# Patient Record
Sex: Male | Born: 1960 | Race: Black or African American | Hispanic: No | Marital: Married | Smoking: Current every day smoker
Health system: Southern US, Community
[De-identification: ages and names within clinical notes are randomized; demographics above are authoritative.]

## PROBLEM LIST (undated history)

## (undated) DIAGNOSIS — C3411 Malignant neoplasm of upper lobe, right bronchus or lung: Secondary | ICD-10-CM

## (undated) DIAGNOSIS — S8290XA Unspecified fracture of unspecified lower leg, initial encounter for closed fracture: Secondary | ICD-10-CM

## (undated) DIAGNOSIS — M1710 Unilateral primary osteoarthritis, unspecified knee: Secondary | ICD-10-CM

## (undated) DIAGNOSIS — C3490 Malignant neoplasm of unspecified part of unspecified bronchus or lung: Secondary | ICD-10-CM

## (undated) DIAGNOSIS — Z720 Tobacco use: Secondary | ICD-10-CM

## (undated) DIAGNOSIS — N200 Calculus of kidney: Secondary | ICD-10-CM

## (undated) DIAGNOSIS — R945 Abnormal results of liver function studies: Secondary | ICD-10-CM

## (undated) DIAGNOSIS — B019 Varicella without complication: Secondary | ICD-10-CM

## (undated) DIAGNOSIS — M1990 Unspecified osteoarthritis, unspecified site: Secondary | ICD-10-CM

## (undated) DIAGNOSIS — G8929 Other chronic pain: Secondary | ICD-10-CM

## (undated) DIAGNOSIS — Z9221 Personal history of antineoplastic chemotherapy: Secondary | ICD-10-CM

## (undated) HISTORY — DX: Calculus of kidney: N20.0

## (undated) HISTORY — DX: Personal history of antineoplastic chemotherapy: Z92.21

## (undated) HISTORY — DX: Abnormal results of liver function studies: R94.5

## (undated) HISTORY — DX: Unilateral primary osteoarthritis, unspecified knee: M17.10

## (undated) HISTORY — DX: Other chronic pain: G89.29

## (undated) HISTORY — DX: Unspecified osteoarthritis, unspecified site: M19.90

## (undated) HISTORY — DX: Unspecified fracture of unspecified lower leg, initial encounter for closed fracture: S82.90XA

## (undated) HISTORY — DX: Malignant neoplasm of unspecified part of unspecified bronchus or lung: C34.90

## (undated) HISTORY — DX: Tobacco use: Z72.0

## (undated) HISTORY — DX: Malignant neoplasm of upper lobe, right bronchus or lung: C34.11

## (undated) HISTORY — DX: Varicella without complication: B01.9

---

## 2006-10-28 ENCOUNTER — Emergency Department: Payer: Self-pay | Admitting: Emergency Medicine

## 2006-12-31 ENCOUNTER — Emergency Department: Payer: Self-pay | Admitting: Emergency Medicine

## 2007-01-21 ENCOUNTER — Emergency Department: Payer: Self-pay | Admitting: Emergency Medicine

## 2007-01-21 ENCOUNTER — Other Ambulatory Visit: Payer: Self-pay

## 2007-01-26 ENCOUNTER — Emergency Department: Payer: Self-pay | Admitting: Internal Medicine

## 2007-05-13 ENCOUNTER — Emergency Department: Payer: Self-pay | Admitting: Emergency Medicine

## 2007-10-21 ENCOUNTER — Other Ambulatory Visit: Payer: Self-pay

## 2007-10-21 ENCOUNTER — Emergency Department: Payer: Self-pay | Admitting: Emergency Medicine

## 2008-01-16 ENCOUNTER — Emergency Department (HOSPITAL_COMMUNITY): Admission: EM | Admit: 2008-01-16 | Discharge: 2008-01-16 | Payer: Self-pay | Admitting: Emergency Medicine

## 2008-01-16 ENCOUNTER — Encounter: Payer: Self-pay | Admitting: Orthopedic Surgery

## 2008-01-21 ENCOUNTER — Ambulatory Visit: Payer: Self-pay | Admitting: Orthopedic Surgery

## 2008-01-21 DIAGNOSIS — S93409A Sprain of unspecified ligament of unspecified ankle, initial encounter: Secondary | ICD-10-CM | POA: Insufficient documentation

## 2008-01-21 DIAGNOSIS — S92009A Unspecified fracture of unspecified calcaneus, initial encounter for closed fracture: Secondary | ICD-10-CM

## 2008-01-22 ENCOUNTER — Encounter: Payer: Self-pay | Admitting: Orthopedic Surgery

## 2008-02-10 ENCOUNTER — Encounter: Payer: Self-pay | Admitting: Orthopedic Surgery

## 2008-03-03 ENCOUNTER — Ambulatory Visit: Payer: Self-pay | Admitting: Orthopedic Surgery

## 2008-03-12 ENCOUNTER — Emergency Department: Payer: Self-pay | Admitting: Internal Medicine

## 2008-04-02 HISTORY — PX: ORIF FEMUR FRACTURE: SHX2119

## 2008-04-02 HISTORY — PX: SHOULDER SURGERY: SHX246

## 2008-04-02 HISTORY — PX: OTHER SURGICAL HISTORY: SHX169

## 2008-04-02 HISTORY — PX: HAND SURGERY: SHX662

## 2008-11-23 ENCOUNTER — Emergency Department: Payer: Self-pay | Admitting: Unknown Physician Specialty

## 2008-12-05 ENCOUNTER — Inpatient Hospital Stay: Payer: Self-pay | Admitting: Unknown Physician Specialty

## 2009-01-13 ENCOUNTER — Encounter: Payer: Self-pay | Admitting: Unknown Physician Specialty

## 2009-01-31 ENCOUNTER — Encounter: Payer: Self-pay | Admitting: Unknown Physician Specialty

## 2009-02-17 ENCOUNTER — Ambulatory Visit: Payer: Self-pay | Admitting: Unknown Physician Specialty

## 2009-02-22 ENCOUNTER — Ambulatory Visit: Payer: Self-pay | Admitting: Unknown Physician Specialty

## 2010-02-07 ENCOUNTER — Inpatient Hospital Stay: Payer: Self-pay | Admitting: Internal Medicine

## 2010-04-17 ENCOUNTER — Emergency Department: Payer: Self-pay | Admitting: Emergency Medicine

## 2011-10-14 ENCOUNTER — Emergency Department: Payer: Self-pay | Admitting: Emergency Medicine

## 2011-10-14 LAB — COMPREHENSIVE METABOLIC PANEL
Alkaline Phosphatase: 63 U/L (ref 50–136)
Anion Gap: 7 (ref 7–16)
Calcium, Total: 8.6 mg/dL (ref 8.5–10.1)
Chloride: 105 mmol/L (ref 98–107)
Co2: 28 mmol/L (ref 21–32)
EGFR (African American): >60
EGFR (Non-African Amer.): >60
Osmolality: 278 (ref 275–301)
Potassium: 4.4 mmol/L (ref 3.5–5.1)
Sodium: 140 mmol/L (ref 136–145)

## 2011-10-14 LAB — PROTIME-INR
INR: 0.9
Prothrombin Time: 13 secs (ref 11.5–14.7)

## 2011-10-14 LAB — CBC WITH DIFFERENTIAL/PLATELET
Basophil #: 0 10*3/uL (ref 0.0–0.1)
Basophil %: 0.6 %
Eosinophil #: 0 10*3/uL (ref 0.0–0.7)
Eosinophil %: 0.7 %
HCT: 43.2 % (ref 40.0–52.0)
HGB: 13.9 g/dL (ref 13.0–18.0)
Lymphocyte #: 2.7 10*3/uL (ref 1.0–3.6)
Lymphocyte %: 49.4 %
MCH: 29.5 pg (ref 26.0–34.0)
MCHC: 32.3 g/dL (ref 32.0–36.0)
MCV: 91 fL (ref 80–100)
Monocyte #: 0.8 x10 3/mm (ref 0.2–1.0)
Monocyte %: 14.2 %
Neutrophil #: 1.9 10*3/uL (ref 1.4–6.5)
Neutrophil %: 35.1 %
Platelet: 171 10*3/uL (ref 150–440)
RBC: 4.74 10*6/uL (ref 4.40–5.90)
RDW: 12.7 % (ref 11.5–14.5)
WBC: 5.5 10*3/uL (ref 3.8–10.6)

## 2011-10-14 LAB — ETHANOL: Ethanol: 42 mg/dL

## 2011-10-14 LAB — APTT: Activated PTT: 29.3 secs (ref 23.6–35.9)

## 2012-10-17 ENCOUNTER — Emergency Department: Payer: Self-pay | Admitting: Emergency Medicine

## 2013-04-02 DIAGNOSIS — C3490 Malignant neoplasm of unspecified part of unspecified bronchus or lung: Secondary | ICD-10-CM

## 2013-04-02 HISTORY — DX: Malignant neoplasm of unspecified part of unspecified bronchus or lung: C34.90

## 2013-08-17 ENCOUNTER — Emergency Department: Payer: Self-pay | Admitting: Emergency Medicine

## 2013-08-20 ENCOUNTER — Emergency Department: Payer: Self-pay | Admitting: Emergency Medicine

## 2013-08-20 LAB — CBC
HCT: 38.3 % — ABNORMAL LOW (ref 40.0–52.0)
HGB: 12.6 g/dL — ABNORMAL LOW (ref 13.0–18.0)
MCH: 28.8 pg (ref 26.0–34.0)
MCHC: 32.8 g/dL (ref 32.0–36.0)
MCV: 88 fL (ref 80–100)
Platelet: 153 10*3/uL (ref 150–440)
RBC: 4.36 10*6/uL — ABNORMAL LOW (ref 4.40–5.90)
RDW: 12.7 % (ref 11.5–14.5)
WBC: 8.7 10*3/uL (ref 3.8–10.6)

## 2013-08-20 LAB — COMPREHENSIVE METABOLIC PANEL
ALK PHOS: 44 U/L — AB
ALT: 23 U/L (ref 12–78)
ANION GAP: 5 — AB (ref 7–16)
Albumin: 2.9 g/dL — ABNORMAL LOW (ref 3.4–5.0)
BILIRUBIN TOTAL: 0.9 mg/dL (ref 0.2–1.0)
BUN: 10 mg/dL (ref 7–18)
CALCIUM: 8.1 mg/dL — AB (ref 8.5–10.1)
CO2: 25 mmol/L (ref 21–32)
CREATININE: 1.11 mg/dL (ref 0.60–1.30)
Chloride: 103 mmol/L (ref 98–107)
EGFR (African American): >60
GLUCOSE: 94 mg/dL (ref 65–99)
Osmolality: 265 (ref 275–301)
POTASSIUM: 3.9 mmol/L (ref 3.5–5.1)
SGOT(AST): 27 U/L (ref 15–37)
SODIUM: 133 mmol/L — AB (ref 136–145)
Total Protein: 7.7 g/dL (ref 6.4–8.2)

## 2013-08-23 LAB — BETA STREP CULTURE(ARMC)

## 2013-12-14 ENCOUNTER — Emergency Department: Payer: Self-pay | Admitting: Emergency Medicine

## 2014-02-13 ENCOUNTER — Emergency Department: Payer: Self-pay | Admitting: Emergency Medicine

## 2014-05-18 ENCOUNTER — Ambulatory Visit: Payer: Self-pay | Admitting: Internal Medicine

## 2014-05-26 DIAGNOSIS — Z720 Tobacco use: Secondary | ICD-10-CM | POA: Insufficient documentation

## 2014-05-26 DIAGNOSIS — M1710 Unilateral primary osteoarthritis, unspecified knee: Secondary | ICD-10-CM | POA: Insufficient documentation

## 2014-05-26 DIAGNOSIS — M179 Osteoarthritis of knee, unspecified: Secondary | ICD-10-CM

## 2014-05-26 HISTORY — DX: Unilateral primary osteoarthritis, unspecified knee: M17.10

## 2014-05-26 HISTORY — DX: Osteoarthritis of knee, unspecified: M17.9

## 2014-05-26 HISTORY — DX: Tobacco use: Z72.0

## 2014-05-28 ENCOUNTER — Ambulatory Visit: Payer: Self-pay | Admitting: Internal Medicine

## 2014-06-01 ENCOUNTER — Ambulatory Visit
Admit: 2014-06-01 | Disposition: A | Discharge status: Home / Self Care | Payer: Self-pay | Attending: Internal Medicine | Admitting: Internal Medicine

## 2014-06-02 ENCOUNTER — Ambulatory Visit: Payer: Self-pay | Admitting: Internal Medicine

## 2014-06-14 ENCOUNTER — Ambulatory Visit: Payer: Self-pay | Admitting: Internal Medicine

## 2014-06-28 LAB — PROTIME-INR
INR: 1
Prothrombin Time: 13.7 secs

## 2014-06-28 LAB — BASIC METABOLIC PANEL
Anion Gap: 4 — ABNORMAL LOW (ref 7–16)
BUN: 12 mg/dL
CALCIUM: 9.2 mg/dL
CO2: 28 mmol/L
Chloride: 106 mmol/L
Creatinine: 1.04 mg/dL
GLUCOSE: 106 mg/dL — AB
Potassium: 4.7 mmol/L
SODIUM: 138 mmol/L

## 2014-06-28 LAB — CBC CANCER CENTER
BASOS PCT: 1.1 %
Basophil #: 0.1 x10 3/mm (ref 0.0–0.1)
EOS ABS: 0.1 x10 3/mm (ref 0.0–0.7)
Eosinophil %: 0.8 %
HCT: 41.1 % (ref 40.0–52.0)
HGB: 13.4 g/dL (ref 13.0–18.0)
LYMPHS PCT: 48.6 %
Lymphocyte #: 3.3 x10 3/mm (ref 1.0–3.6)
MCH: 29 pg (ref 26.0–34.0)
MCHC: 32.5 g/dL (ref 32.0–36.0)
MCV: 89 fL (ref 80–100)
Monocyte #: 0.8 x10 3/mm (ref 0.2–1.0)
Monocyte %: 12.3 %
NEUTROS ABS: 2.5 x10 3/mm (ref 1.4–6.5)
Neutrophil %: 37.2 %
Platelet: 215 x10 3/mm (ref 150–440)
RBC: 4.62 10*6/uL (ref 4.40–5.90)
RDW: 13 % (ref 11.5–14.5)
WBC: 6.8 x10 3/mm (ref 3.8–10.6)

## 2014-06-28 LAB — HEPATIC FUNCTION PANEL A (ARMC)
ALT: 43 U/L
AST: 37 U/L
Albumin: 4 g/dL
Alkaline Phosphatase: 53 U/L
Bilirubin, Direct: 0.1 mg/dL
Bilirubin,Total: 0.3 mg/dL
Indirect Bilirubin: 0.2
TOTAL PROTEIN: 8.8 g/dL — AB

## 2014-06-28 LAB — APTT: Activated PTT: 29.4 secs (ref 23.6–35.9)

## 2014-07-02 ENCOUNTER — Ambulatory Visit
Admit: 2014-07-02 | Disposition: A | Discharge status: Home / Self Care | Payer: Self-pay | Attending: Internal Medicine | Admitting: Internal Medicine

## 2014-07-05 ENCOUNTER — Ambulatory Visit
Admit: 2014-07-05 | Disposition: A | Discharge status: Home / Self Care | Payer: Self-pay | Attending: Cardiothoracic Surgery | Admitting: Cardiothoracic Surgery

## 2014-07-06 LAB — CBC CANCER CENTER
BASOS ABS: 0 x10 3/mm (ref 0.0–0.1)
BASOS PCT: 0.5 %
EOS ABS: 0.1 x10 3/mm (ref 0.0–0.7)
Eosinophil %: 1.2 %
HCT: 40.1 % (ref 40.0–52.0)
HGB: 12.8 g/dL — ABNORMAL LOW (ref 13.0–18.0)
LYMPHS PCT: 43.8 %
Lymphocyte #: 2.5 x10 3/mm (ref 1.0–3.6)
MCH: 28.8 pg (ref 26.0–34.0)
MCHC: 31.8 g/dL — AB (ref 32.0–36.0)
MCV: 90 fL (ref 80–100)
MONO ABS: 0.7 x10 3/mm (ref 0.2–1.0)
Monocyte %: 12 %
NEUTROS ABS: 2.4 x10 3/mm (ref 1.4–6.5)
Neutrophil %: 42.5 %
PLATELETS: 203 x10 3/mm (ref 150–440)
RBC: 4.44 10*6/uL (ref 4.40–5.90)
RDW: 12.9 % (ref 11.5–14.5)
WBC: 5.6 x10 3/mm (ref 3.8–10.6)

## 2014-07-06 LAB — BASIC METABOLIC PANEL
ANION GAP: 4 — AB (ref 7–16)
BUN: 10 mg/dL
CALCIUM: 8.5 mg/dL — AB
CHLORIDE: 105 mmol/L
CO2: 29 mmol/L
Creatinine: 0.77 mg/dL
EGFR (African American): >60
GLUCOSE: 104 mg/dL — AB
Potassium: 3.8 mmol/L
Sodium: 138 mmol/L

## 2014-07-06 LAB — CREATININE, SERUM: Creat: 0.77

## 2014-07-06 LAB — MAGNESIUM: MAGNESIUM: 1.8 mg/dL

## 2014-07-26 LAB — SURGICAL PATHOLOGY

## 2014-07-27 ENCOUNTER — Other Ambulatory Visit: Payer: Self-pay | Admitting: Internal Medicine

## 2014-07-27 DIAGNOSIS — C3490 Malignant neoplasm of unspecified part of unspecified bronchus or lung: Secondary | ICD-10-CM

## 2014-07-27 LAB — BASIC METABOLIC PANEL
ANION GAP: 5 — AB (ref 7–16)
BUN: 10 mg/dL
CHLORIDE: 104 mmol/L
CREATININE: 0.86 mg/dL
Calcium, Total: 8.6 mg/dL — ABNORMAL LOW
Co2: 27 mmol/L
GLUCOSE: 151 mg/dL — AB
Potassium: 3.9 mmol/L
Sodium: 136 mmol/L

## 2014-07-27 LAB — CBC CANCER CENTER
BASOS PCT: 1.2 %
Basophil #: 0.1 x10 3/mm (ref 0.0–0.1)
EOS ABS: 0 x10 3/mm (ref 0.0–0.7)
Eosinophil %: 0.2 %
HCT: 39.8 % — AB (ref 40.0–52.0)
HGB: 13.1 g/dL (ref 13.0–18.0)
LYMPHS ABS: 2.4 x10 3/mm (ref 1.0–3.6)
LYMPHS PCT: 46.5 %
MCH: 29.1 pg (ref 26.0–34.0)
MCHC: 33 g/dL (ref 32.0–36.0)
MCV: 88 fL (ref 80–100)
MONO ABS: 0.7 x10 3/mm (ref 0.2–1.0)
MONOS PCT: 14.2 %
Neutrophil #: 2 x10 3/mm (ref 1.4–6.5)
Neutrophil %: 37.9 %
Platelet: 285 x10 3/mm (ref 150–440)
RBC: 4.51 10*6/uL (ref 4.40–5.90)
RDW: 13.5 % (ref 11.5–14.5)
WBC: 5.2 x10 3/mm (ref 3.8–10.6)

## 2014-07-28 ENCOUNTER — Other Ambulatory Visit: Payer: Self-pay | Admitting: Internal Medicine

## 2014-07-28 DIAGNOSIS — C3490 Malignant neoplasm of unspecified part of unspecified bronchus or lung: Secondary | ICD-10-CM

## 2014-08-01 NOTE — Op Note (Signed)
PATIENT NAME:  Mario [name], Mario [name] MR#:  [id number] DATE OF BIRTH:  [birth year]  DATE OF PROCEDURE:  07/05/2014  SURGEON: Louis Matte, M.D.   ASSISTANT: Hubert Azure, PA student.   PREOPERATIVE DIAGNOSIS: Lung cancer.   POSTOPERATIVE DIAGNOSIS: Lung cancer.   OPERATION PERFORMED: Insertion of left subclavian Port-A-Cath using ultrasound guidance.   INDICATIONS FOR PROCEDURE: [name] is a 54 year old gentleman with a history of lung cancer who requires intravenous access for administration of chemotherapeutic agents. The indications and risks were explained to the patient, who gave his informed consent.   DESCRIPTION OF PROCEDURE: The patient was brought to the operating suite and placed in the supine position. General anesthesia was given with a laryngeal mask airway. Ultrasound was carried out on the right internal jugular and the left subclavian vein. These were both patent. I elected the left side because of the patient's tumor on the right. The patient was then prepped and draped in the usual sterile fashion. The left subclavian vein was percutaneously catheterized using a needle and a wire was passed through the needle into the right side of the heart and verified to be in the proper position under fluoroscopic guidance. A port site was then created on the anterior aspect of the left chest wall. The catheter was tunneled from our port site to the entrance site and then passed through a peel-away sheath and positioned at the junction of the superior vena cava and right atrium. The catheter irrigated and flushed nicely. It was then trimmed to appropriate length and the catheter was assembled. It was tacked to the anterior chest wall fascia with interrupted Prolene sutures. The catheter irrigated and flushed nicely. Fluoroscopy was again carried out and the catheter was in good position without evidence of pleural effusion or pneumothorax. The port site was closed with running Vicryl and  running nylon. The entrance site was closed with a single nylon suture. The patient tolerated the procedure well and after sterile dressings were applied he was taken to the recovery room in stable condition.    ____________________________ Lew Dawes Genevive Bi, MD teo:bu D: 07/05/2014 15:15:28 ET T: 07/05/2014 17:53:46 ET JOB#: [id number]  cc: Christia Reading E. Genevive Bi, MD, <Dictator> Louis Matte MD ELECTRONICALLY SIGNED 07/12/2014 12:09

## 2014-08-02 ENCOUNTER — Other Ambulatory Visit: Payer: Self-pay | Admitting: *Deleted

## 2014-08-02 DIAGNOSIS — C3491 Malignant neoplasm of unspecified part of right bronchus or lung: Secondary | ICD-10-CM

## 2014-08-03 ENCOUNTER — Other Ambulatory Visit: Payer: Self-pay

## 2014-08-06 ENCOUNTER — Other Ambulatory Visit: Payer: Self-pay | Admitting: *Deleted

## 2014-08-09 ENCOUNTER — Other Ambulatory Visit: Payer: Self-pay | Admitting: *Deleted

## 2014-08-09 DIAGNOSIS — C3491 Malignant neoplasm of unspecified part of right bronchus or lung: Secondary | ICD-10-CM

## 2014-08-10 ENCOUNTER — Inpatient Hospital Stay: Payer: BLUE CROSS/BLUE SHIELD | Attending: Internal Medicine

## 2014-08-10 ENCOUNTER — Ambulatory Visit
Admission: RE | Admit: 2014-08-10 | Discharge: 2014-08-10 | Disposition: A | Discharge status: Home / Self Care | Payer: BLUE CROSS/BLUE SHIELD | Source: Ambulatory Visit | Attending: Internal Medicine | Admitting: Internal Medicine

## 2014-08-10 DIAGNOSIS — C3490 Malignant neoplasm of unspecified part of unspecified bronchus or lung: Secondary | ICD-10-CM | POA: Insufficient documentation

## 2014-08-10 DIAGNOSIS — Z8781 Personal history of (healed) traumatic fracture: Secondary | ICD-10-CM | POA: Diagnosis not present

## 2014-08-10 DIAGNOSIS — Z5111 Encounter for antineoplastic chemotherapy: Secondary | ICD-10-CM | POA: Diagnosis not present

## 2014-08-10 DIAGNOSIS — C3411 Malignant neoplasm of upper lobe, right bronchus or lung: Secondary | ICD-10-CM | POA: Insufficient documentation

## 2014-08-10 DIAGNOSIS — G893 Neoplasm related pain (acute) (chronic): Secondary | ICD-10-CM | POA: Diagnosis not present

## 2014-08-10 DIAGNOSIS — I700 Atherosclerosis of aorta: Secondary | ICD-10-CM | POA: Diagnosis not present

## 2014-08-10 DIAGNOSIS — R05 Cough: Secondary | ICD-10-CM | POA: Insufficient documentation

## 2014-08-10 DIAGNOSIS — F17210 Nicotine dependence, cigarettes, uncomplicated: Secondary | ICD-10-CM | POA: Insufficient documentation

## 2014-08-10 DIAGNOSIS — Z79899 Other long term (current) drug therapy: Secondary | ICD-10-CM | POA: Diagnosis not present

## 2014-08-10 DIAGNOSIS — B1920 Unspecified viral hepatitis C without hepatic coma: Secondary | ICD-10-CM | POA: Diagnosis not present

## 2014-08-10 DIAGNOSIS — R590 Localized enlarged lymph nodes: Secondary | ICD-10-CM | POA: Insufficient documentation

## 2014-08-10 DIAGNOSIS — C3491 Malignant neoplasm of unspecified part of right bronchus or lung: Secondary | ICD-10-CM

## 2014-08-10 DIAGNOSIS — M129 Arthropathy, unspecified: Secondary | ICD-10-CM | POA: Insufficient documentation

## 2014-08-10 LAB — HEPATIC FUNCTION PANEL
ALBUMIN: 3.3 g/dL — AB (ref 3.5–5.0)
ALT: 166 U/L — AB (ref 17–63)
AST: 168 U/L — AB (ref 15–41)
Alkaline Phosphatase: 114 U/L (ref 38–126)
BILIRUBIN TOTAL: 0.7 mg/dL (ref 0.3–1.2)
Bilirubin, Direct: 0.2 mg/dL (ref 0.1–0.5)
Indirect Bilirubin: 0.5 mg/dL (ref 0.3–0.9)
Total Protein: 7 g/dL (ref 6.5–8.1)

## 2014-08-10 LAB — CBC WITH DIFFERENTIAL/PLATELET
BASOS ABS: 0 10*3/uL (ref 0–0.1)
BASOS PCT: 0 %
Eosinophils Absolute: 0 10*3/uL (ref 0–0.7)
Eosinophils Relative: 0 %
HCT: 36.5 % — ABNORMAL LOW (ref 40.0–52.0)
Hemoglobin: 11.9 g/dL — ABNORMAL LOW (ref 13.0–18.0)
Lymphocytes Relative: 28 %
Lymphs Abs: 2.2 10*3/uL (ref 1.0–3.6)
MCH: 29 pg (ref 26.0–34.0)
MCHC: 32.5 g/dL (ref 32.0–36.0)
MCV: 89.5 fL (ref 80.0–100.0)
MONOS PCT: 13 %
Monocytes Absolute: 1 10*3/uL (ref 0.2–1.0)
NEUTROS ABS: 4.7 10*3/uL (ref 1.4–6.5)
Neutrophils Relative %: 59 %
Platelets: 199 10*3/uL (ref 150–440)
RBC: 4.08 MIL/uL — ABNORMAL LOW (ref 4.40–5.90)
RDW: 14.7 % — AB (ref 11.5–14.5)
WBC: 7.9 10*3/uL (ref 3.8–10.6)

## 2014-08-10 LAB — CREATININE, SERUM
Creatinine, Ser: 0.91 mg/dL (ref 0.61–1.24)
GFR calc Af Amer: >60 mL/min (ref >60)

## 2014-08-10 LAB — POTASSIUM: POTASSIUM: 4.2 mmol/L (ref 3.5–5.1)

## 2014-08-10 LAB — MAGNESIUM: Magnesium: 1.9 mg/dL (ref 1.7–2.4)

## 2014-08-10 MED ORDER — IOHEXOL 300 MG/ML  SOLN
75.0000 mL | Freq: Once | INTRAMUSCULAR | Status: AC | PRN
  Administered 2014-08-10: 75 mL via INTRAVENOUS

## 2014-08-12 ENCOUNTER — Inpatient Hospital Stay: Payer: No Typology Code available for payment source

## 2014-08-17 ENCOUNTER — Inpatient Hospital Stay: Payer: BLUE CROSS/BLUE SHIELD

## 2014-08-17 ENCOUNTER — Inpatient Hospital Stay (HOSPITAL_BASED_OUTPATIENT_CLINIC_OR_DEPARTMENT_OTHER): Payer: BLUE CROSS/BLUE SHIELD | Admitting: Internal Medicine

## 2014-08-17 VITALS — BP 129/76 | HR 68 | Temp 95.4°F | Ht 68.98 in | Wt 116.0 lb

## 2014-08-17 DIAGNOSIS — M129 Arthropathy, unspecified: Secondary | ICD-10-CM

## 2014-08-17 DIAGNOSIS — B1920 Unspecified viral hepatitis C without hepatic coma: Secondary | ICD-10-CM | POA: Diagnosis not present

## 2014-08-17 DIAGNOSIS — F17210 Nicotine dependence, cigarettes, uncomplicated: Secondary | ICD-10-CM

## 2014-08-17 DIAGNOSIS — G893 Neoplasm related pain (acute) (chronic): Secondary | ICD-10-CM | POA: Diagnosis not present

## 2014-08-17 DIAGNOSIS — C3490 Malignant neoplasm of unspecified part of unspecified bronchus or lung: Secondary | ICD-10-CM

## 2014-08-17 DIAGNOSIS — Z79899 Other long term (current) drug therapy: Secondary | ICD-10-CM

## 2014-08-17 DIAGNOSIS — I700 Atherosclerosis of aorta: Secondary | ICD-10-CM

## 2014-08-17 DIAGNOSIS — C3411 Malignant neoplasm of upper lobe, right bronchus or lung: Secondary | ICD-10-CM

## 2014-08-17 DIAGNOSIS — C3491 Malignant neoplasm of unspecified part of right bronchus or lung: Secondary | ICD-10-CM

## 2014-08-17 DIAGNOSIS — R05 Cough: Secondary | ICD-10-CM | POA: Diagnosis not present

## 2014-08-17 DIAGNOSIS — Z8781 Personal history of (healed) traumatic fracture: Secondary | ICD-10-CM

## 2014-08-17 LAB — BASIC METABOLIC PANEL
ANION GAP: 6 (ref 5–15)
BUN: 12 mg/dL (ref 6–20)
CALCIUM: 8.7 mg/dL — AB (ref 8.9–10.3)
CHLORIDE: 104 mmol/L (ref 101–111)
CO2: 30 mmol/L (ref 22–32)
Creatinine, Ser: 1.01 mg/dL (ref 0.61–1.24)
GFR calc Af Amer: >60 mL/min (ref >60)
GFR calc non Af Amer: >60 mL/min (ref >60)
GLUCOSE: 114 mg/dL — AB (ref 65–99)
POTASSIUM: 3.8 mmol/L (ref 3.5–5.1)
SODIUM: 140 mmol/L (ref 135–145)

## 2014-08-17 LAB — CBC WITH DIFFERENTIAL/PLATELET
Basophils Absolute: 0.1 10*3/uL (ref 0–0.1)
Basophils Relative: 1 %
Eosinophils Absolute: 0 10*3/uL (ref 0–0.7)
Eosinophils Relative: 0 %
HCT: 39.2 % — ABNORMAL LOW (ref 40.0–52.0)
HEMOGLOBIN: 12.7 g/dL — AB (ref 13.0–18.0)
LYMPHS ABS: 2 10*3/uL (ref 1.0–3.6)
Lymphocytes Relative: 41 %
MCH: 29.2 pg (ref 26.0–34.0)
MCHC: 32.4 g/dL (ref 32.0–36.0)
MCV: 90.1 fL (ref 80.0–100.0)
MONOS PCT: 15 %
Monocytes Absolute: 0.7 10*3/uL (ref 0.2–1.0)
NEUTROS PCT: 43 %
Neutro Abs: 2 10*3/uL (ref 1.4–6.5)
Platelets: 233 10*3/uL (ref 150–440)
RBC: 4.35 MIL/uL — ABNORMAL LOW (ref 4.40–5.90)
RDW: 15.3 % — ABNORMAL HIGH (ref 11.5–14.5)
WBC: 4.7 10*3/uL (ref 3.8–10.6)

## 2014-08-17 MED ORDER — HEPARIN SOD (PORK) LOCK FLUSH 100 UNIT/ML IV SOLN
500.0000 [IU] | Freq: Once | INTRAVENOUS | Status: AC
  Administered 2014-08-17: 500 [IU] via INTRAVENOUS

## 2014-08-17 MED ORDER — HEPARIN SOD (PORK) LOCK FLUSH 100 UNIT/ML IV SOLN
INTRAVENOUS | Status: AC
  Filled 2014-08-17: qty 5

## 2014-08-19 ENCOUNTER — Encounter: Payer: Self-pay | Admitting: Radiation Oncology

## 2014-08-19 ENCOUNTER — Ambulatory Visit
Admission: RE | Admit: 2014-08-19 | Discharge: 2014-08-19 | Disposition: A | Discharge status: Home / Self Care | Payer: BLUE CROSS/BLUE SHIELD | Source: Ambulatory Visit | Attending: Radiation Oncology | Admitting: Radiation Oncology

## 2014-08-19 VITALS — BP 121/74 | HR 74 | Resp 18 | Wt 119.2 lb

## 2014-08-19 DIAGNOSIS — C3411 Malignant neoplasm of upper lobe, right bronchus or lung: Secondary | ICD-10-CM

## 2014-08-19 DIAGNOSIS — Z510 Encounter for antineoplastic radiation therapy: Secondary | ICD-10-CM | POA: Insufficient documentation

## 2014-08-19 DIAGNOSIS — C3490 Malignant neoplasm of unspecified part of unspecified bronchus or lung: Secondary | ICD-10-CM | POA: Insufficient documentation

## 2014-08-19 NOTE — Consult Note (Signed)
Radiation Oncology NEW PATIENT EVALUATION  Name: Mario [name]  MRN: [id number]  Date:   08/19/2014     DOB: July 10, 1960   This 54 y.o. 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  REFERRING PHYSICIAN: Tracie Harrier, MD  CHIEF COMPLAINT:  Chief Complaint  Patient presents with  . Lung Cancer    DIAGNOSIS: There were no encounter diagnoses.   PREVIOUS INVESTIGATIONS:  PET CT scan and CT scans reviewed Pathology report reviewed Clinical notes reviewed   HPI: Patient is a 54 year old male presents with right shoulder pain on routine examination was noted to have a mass in the periphery of the right upper lobe. Mass was 2.8 x 2.2 cm seen on chest CT 05/18/2014. Patient underwent CT-guided biopsy positive for adenocarcinoma with negative P 40 supporting diagnosis of adenocarcinoma. Patient has been started on cis-platinum Alimta which is tolerated fairly well although continues to have narcotic dependent right shoulder pain. He specifically denies cough hemoptysis or chest tightness. On his PET/CT scan and CT scan there is the spiculated non-nodule which is hypermetabolic abutting and probably invading the chest wall. He also has a hypermetabolic node in the right hilum as well as right paratracheal region. There is also a faint slight hypermetabolic mass lower down in the right chest which is indeterminate for malignancy. He is tolerated his chemotherapy well he is seen today for opinion regarding concurrent chemotherapy and radiation.  PLANNED TREATMENT REGIMEN: Combined modality chemoradiation with curative intent to his chest  PAST MEDICAL HISTORY:  has a past medical history of Lung cancer; Arthritis; Kidney stones; and Chicken pox.    PAST SURGICAL HISTORY:  Past Surgical History  Procedure Laterality Date  . Orif femur fracture Right 2010  . Hand surgery Right 2010    ORIF of right index finder, 2nd  carpometacarpal joint dislocation right hand, proximal right humerus  . Amputation of index finger  2010    FAMILY HISTORY: family history is not on file.  SOCIAL HISTORY:  reports that he has been smoking.  He does not have any smokeless tobacco history on file. He reports that he uses illicit drugs (Marijuana). He reports that he does not drink alcohol.  ALLERGIES: No known allergies  MEDICATIONS:  Current Outpatient Prescriptions  Medication Sig Dispense Refill  . HYDROcodone-acetaminophen (NORCO/VICODIN) 5-325 MG per tablet Take 1-2 tablets by mouth every 6 (six) hours as needed for moderate pain.    Marland Kitchen ondansetron (ZOFRAN) 4 MG tablet Take 4 mg by mouth every 4 (four) hours as needed for nausea or vomiting.    . promethazine (PHENERGAN) 25 MG tablet Take 25 mg by mouth every 6 (six) hours as needed for nausea or vomiting.     No current facility-administered medications for this encounter.    ECOG PERFORMANCE STATUS:  0 - Asymptomatic  REVIEW OF SYSTEMS: Except for the right shoulder pain Patient denies any weight loss, fatigue, weakness, fever, chills or night sweats. Patient denies any loss of vision, blurred vision. Patient denies any ringing  of the ears or hearing loss. No irregular heartbeat. Patient denies heart murmur or history of fainting. Patient denies any chest pain or pain radiating to her upper extremities. Patient denies any shortness of breath, difficulty breathing at night, cough or hemoptysis. Patient denies any swelling in the lower legs. Patient denies any nausea vomiting, vomiting of blood, or coffee ground material in the vomitus. Patient denies any stomach pain. Patient states has  had normal bowel movements no significant constipation or diarrhea. Patient denies any dysuria, hematuria or significant nocturia. Patient denies any problems walking, swelling in the joints or loss of balance. Patient denies any skin changes, loss of hair or loss of weight. Patient denies  any excessive worrying or anxiety or significant depression. Patient denies any problems with insomnia. Patient denies excessive thirst, polyuria, polydipsia. Patient denies any swollen glands, patient denies easy bruising or easy bleeding. Patient denies any recent infections, allergies or URI. Patient "s visual fields have not changed significantly in recent time.    PHYSICAL EXAM: BP 121/74 mmHg  Pulse 74  Resp 18  Wt 119 lb 2.5 oz (54.05 kg) Well-developed thin male in NAD no cervical or supraclavicular adenopathy is appreciated. Lungs are clear to A&P Kartik examination shows regular rate and rhythm. He has a Port-A-Cath placed in his left anterior chest wall. Abdomen is benign. Well-developed well-nourished patient in NAD. HEENT reveals PERLA, EOMI, discs not visualized.  Oral cavity is clear. No oral mucosal lesions are identified. Neck is clear without evidence of cervical or supraclavicular adenopathy. Lungs are clear to A&P. Cardiac examination is essentially unremarkable with regular rate and rhythm without murmur rub or thrill. Abdomen is benign with no organomegaly or masses noted. Motor sensory and DTR levels are equal and symmetric in the upper and lower extremities. Cranial nerves II through XII are grossly intact. Proprioception is intact. No peripheral adenopathy or edema is identified. No motor or sensory levels are noted. Crude visual fields are within normal range.   LABORATORY DATA:  Results for orders placed or performed in visit on 08/17/14 (from the past 72 hour(s))  CBC with Differential/Platelet     Status: Abnormal   Collection Time: 08/17/14  9:32 AM  Result Value Ref Range   WBC 4.7 3.8 - 10.6 K/uL    Comment: A-LINE DRAW   RBC 4.35 (L) 4.40 - 5.90 MIL/uL   Hemoglobin 12.7 (L) 13.0 - 18.0 g/dL   HCT 39.2 (L) 40.0 - 52.0 %   MCV 90.1 80.0 - 100.0 fL   MCH 29.2 26.0 - 34.0 pg   MCHC 32.4 32.0 - 36.0 g/dL   RDW 15.3 (H) 11.5 - 14.5 %   Platelets 233 150 - 440 K/uL    Neutrophils Relative % 43 %   Neutro Abs 2.0 1.4 - 6.5 K/uL   Lymphocytes Relative 41 %   Lymphs Abs 2.0 1.0 - 3.6 K/uL   Monocytes Relative 15 %   Monocytes Absolute 0.7 0.2 - 1.0 K/uL   Eosinophils Relative 0 %   Eosinophils Absolute 0.0 0 - 0.7 K/uL   Basophils Relative 1 %   Basophils Absolute 0.1 0 - 0.1 K/uL  Basic metabolic panel     Status: Abnormal   Collection Time: 08/17/14  9:32 AM  Result Value Ref Range   Sodium 140 135 - 145 mmol/L   Potassium 3.8 3.5 - 5.1 mmol/L   Chloride 104 101 - 111 mmol/L   CO2 30 22 - 32 mmol/L   Glucose, Bld 114 (H) 65 - 99 mg/dL   BUN 12 6 - 20 mg/dL   Creatinine, Ser 1.01 0.61 - 1.24 mg/dL   Calcium 8.7 (L) 8.9 - 10.3 mg/dL   GFR calc non Af Amer >60 >60 mL/min   GFR calc Af Amer >60 >60 mL/min    Comment: (NOTE) The eGFR has been calculated using the CKD EPI equation. This calculation has not been validated in all  clinical situations. eGFR's persistently <60 mL/min signify possible Chronic Kidney Disease.    Anion gap 6 5 - 15     RADIOLOGY RESULTS: No results found.  IMPRESSION: Stage III adenocarcinoma of the right lung in 54 year old male status post 2 cycles of chemotherapy now for concurrent chemoradiation with curative intent.  PLAN: At this time like to go ahead with radiation therapy. Would plan on delivering 6000 cGy to both the peripheral lung mass as well as areas of hypermetabolic adenopathy in the right hilum and right paratracheal region. At this time I am not going to treat the lesion which is subtle yet only mildly hypermetabolic inferior to the predominant lung mass. I can was come back at some point and treat that area with SB RT should progress or proved to be a progressive malignancy. Risks and benefits of treatment including loss of normal lung volume, dysphasia secondary radiation esophagitis, fatigue, alteration of blood counts, skin reaction all were discussed in detail with the patient and his significant  other. I have set up and ordered CT simulation early next week. I've also referred him back to medical oncology for further pain management. I believe I am RT treatment planning and delivery would enable me to liver the highest dose with minimal morbidity in this patient with already compromised lung function although I will wait till CT simulation to make that determination.  I would like to take this opportunity for allowing me to participate in the care of your patient.Armstead Peaks., MD

## 2014-08-20 ENCOUNTER — Other Ambulatory Visit: Payer: Self-pay | Admitting: Internal Medicine

## 2014-08-20 DIAGNOSIS — C3411 Malignant neoplasm of upper lobe, right bronchus or lung: Secondary | ICD-10-CM

## 2014-08-20 MED ORDER — OXYCODONE HCL 5 MG PO TABS: ORAL_TABLET | ORAL | Status: DC

## 2014-08-21 ENCOUNTER — Other Ambulatory Visit: Payer: Self-pay | Admitting: Internal Medicine

## 2014-08-24 ENCOUNTER — Inpatient Hospital Stay: Payer: BLUE CROSS/BLUE SHIELD

## 2014-08-24 ENCOUNTER — Ambulatory Visit
Admission: RE | Admit: 2014-08-24 | Discharge: 2014-08-24 | Disposition: A | Discharge status: Home / Self Care | Payer: BLUE CROSS/BLUE SHIELD | Source: Ambulatory Visit | Attending: Radiation Oncology | Admitting: Radiation Oncology

## 2014-08-24 VITALS — BP 151/97 | HR 64 | Temp 97.0°F | Resp 18

## 2014-08-24 DIAGNOSIS — C3490 Malignant neoplasm of unspecified part of unspecified bronchus or lung: Secondary | ICD-10-CM | POA: Diagnosis not present

## 2014-08-24 DIAGNOSIS — C3411 Malignant neoplasm of upper lobe, right bronchus or lung: Secondary | ICD-10-CM | POA: Diagnosis not present

## 2014-08-24 DIAGNOSIS — C3491 Malignant neoplasm of unspecified part of right bronchus or lung: Secondary | ICD-10-CM

## 2014-08-24 DIAGNOSIS — Z510 Encounter for antineoplastic radiation therapy: Secondary | ICD-10-CM | POA: Diagnosis not present

## 2014-08-24 LAB — CREATININE, SERUM
Creatinine, Ser: 1.03 mg/dL (ref 0.61–1.24)
GFR calc Af Amer: >60 mL/min (ref >60)
GFR calc non Af Amer: >60 mL/min (ref >60)

## 2014-08-24 LAB — CBC WITH DIFFERENTIAL/PLATELET
BASOS ABS: 0 10*3/uL (ref 0–0.1)
Basophils Relative: 1 %
Eosinophils Absolute: 0 10*3/uL (ref 0–0.7)
Eosinophils Relative: 1 %
HCT: 38.1 % — ABNORMAL LOW (ref 40.0–52.0)
Hemoglobin: 12.4 g/dL — ABNORMAL LOW (ref 13.0–18.0)
LYMPHS PCT: 47 %
Lymphs Abs: 2.6 10*3/uL (ref 1.0–3.6)
MCH: 29.7 pg (ref 26.0–34.0)
MCHC: 32.6 g/dL (ref 32.0–36.0)
MCV: 90.9 fL (ref 80.0–100.0)
MONO ABS: 0.9 10*3/uL (ref 0.2–1.0)
MONOS PCT: 16 %
NEUTROS ABS: 2 10*3/uL (ref 1.4–6.5)
NEUTROS PCT: 35 %
Platelets: 161 10*3/uL (ref 150–440)
RBC: 4.2 MIL/uL — ABNORMAL LOW (ref 4.40–5.90)
RDW: 15.6 % — AB (ref 11.5–14.5)
WBC: 5.6 10*3/uL (ref 3.8–10.6)

## 2014-08-24 MED ORDER — PALONOSETRON HCL INJECTION 0.25 MG/5ML
0.2500 mg | Freq: Once | INTRAVENOUS | Status: AC
  Administered 2014-08-24: 0.25 mg via INTRAVENOUS
  Filled 2014-08-24: qty 5

## 2014-08-24 MED ORDER — HEPARIN SOD (PORK) LOCK FLUSH 100 UNIT/ML IV SOLN
500.0000 [IU] | Freq: Once | INTRAVENOUS | Status: AC
  Administered 2014-08-24: 500 [IU] via INTRAVENOUS

## 2014-08-24 MED ORDER — SODIUM CHLORIDE 0.9 % IJ SOLN
10.0000 mL | INTRAMUSCULAR | Status: DC | PRN
  Filled 2014-08-24: qty 10

## 2014-08-24 MED ORDER — SODIUM CHLORIDE 0.9 % IV SOLN
Freq: Once | INTRAVENOUS | Status: AC
  Administered 2014-08-24: 12:00:00 via INTRAVENOUS
  Filled 2014-08-24: qty 5

## 2014-08-24 MED ORDER — SODIUM CHLORIDE 0.9 % IV SOLN: Freq: Once | INTRAVENOUS | Status: DC

## 2014-08-24 MED ORDER — SODIUM CHLORIDE 0.9 % IV SOLN
Freq: Once | INTRAVENOUS | Status: AC
  Administered 2014-08-24: 12:00:00 via INTRAVENOUS
  Filled 2014-08-24: qty 250

## 2014-08-24 MED ORDER — HEPARIN SOD (PORK) LOCK FLUSH 100 UNIT/ML IV SOLN
500.0000 [IU] | Freq: Once | INTRAVENOUS | Status: DC | PRN
  Filled 2014-08-24: qty 5

## 2014-08-24 MED ORDER — SODIUM CHLORIDE 0.9 % IV SOLN
350.8000 mg | Freq: Once | INTRAVENOUS | Status: AC
  Administered 2014-08-24: 350 mg via INTRAVENOUS
  Filled 2014-08-24: qty 35

## 2014-08-24 MED ORDER — SODIUM CHLORIDE 0.9 % IV SOLN
500.0000 mg/m2 | Freq: Once | INTRAVENOUS | Status: AC
  Administered 2014-08-24: 800 mg via INTRAVENOUS
  Filled 2014-08-24: qty 28

## 2014-08-25 ENCOUNTER — Other Ambulatory Visit: Payer: BLUE CROSS/BLUE SHIELD

## 2014-08-25 ENCOUNTER — Ambulatory Visit: Payer: BLUE CROSS/BLUE SHIELD

## 2014-08-26 NOTE — Progress Notes (Signed)
[facility]  [phone] Fax:(336) [phone]     ID: Mario [name] OB: 1960/08/28  MR#: [id number]  ZDG#:[id number]  Patient Care Team: Tracie Harrier, MD as PCP - General (Internal Medicine)  CHIEF COMPLAINT/DIAGNOSIS:  Stage III non-small cell lung cancer, unresectable per d/w Dr.Oaks. Starting on concurrent chemoradiation. (presented with spiculated 2.8 x 2.2 cm right upper lobe lung nodule found on CT scan of the chest with contrast on 05/18/2014. Chronic smoking history).  06/14/14 - CT-guided biopsy RUL lung mass. DIAGNOSIS: NON-SMALL CELL CARCINOMA, FAVOR ADENOCARCINOMA. Note: Napsin A is positive in a subset of neoplastic glands and TTF-1 is focally positive. A p40 stain is obtained but tissue is absent on the slide. IHC staining for p40 is performed and is negative which supports the diagnosis.    HISTORY OF PRESENT ILLNESS:  Patient returns for continued oncology evaluation and plan next cycle chemotherapy. States that he has poorly controlled pain in the right upper chest/adjacent shoulder and it mostly bothers him at night. He rates his pain as 4-9/10. States he is eating well.. States that he has persistent cough, denies any new chest pain or hemoptysis. He has mild dyspnea on physical activity only. No fever. No nausea or vomiting. No diarrhea. No new mood disturbances.  REVIEW OF SYSTEMS:   ROS As in HPI above. In addition, no fever, chills or sweats. No new headaches or focal weakness.  No new mood disturbances. No  sore throat, dysphagia or hemoptysis. No dizziness or palpitation. No abdominal pain, constipation, diarrhea, dysuria or hematuria. No new skin rash or bleeding symptoms. No new paresthesias in extremities. PS ECOG 1.  PAST MEDICAL HISTORY: Past Medical History  Diagnosis Date  . Lung cancer   . Arthritis   . Kidney stones   . Chicken pox           History of kidney stones  Chickenpox  Arthritis in the knees  Fracture  right proximal femur status post ORIF September 2010  Fracture right index finger status post ORIF September 2010  Fracture surgery September 2010 including the right index finger PIP joint, second carpometacarpal joint dislocation right hand, proximal right humerus  Removal of humeral nail and amputation of index finger November 2010  PAST SURGICAL HISTORY: Past Surgical History  Procedure Laterality Date  . Orif femur fracture Right 2010  . Hand surgery Right 2010    ORIF of right index finder, 2nd carpometacarpal joint dislocation right hand, proximal right humerus  . Amputation of index finger  2010    FAMILY HISTORY: noncontributory, denies malignancy.   ADVANCED DIRECTIVES:   SOCIAL HISTORY: History  Substance Use Topics  . Smoking status: Current Every Day Smoker -- 0.25 packs/day for 20 years  . Smokeless tobacco: Not on file  . Alcohol Use: No  Chronic smoker half pack per day 20 years. Drinks 12 pack beer a week. History of smoking weed, quit few months ago. Physically active.  Allergies  Allergen Reactions  . No Known Allergies     Pt denies allergy to oxycodone and thus removed from list    Current Outpatient Prescriptions  Medication Sig Dispense Refill  . ondansetron (ZOFRAN) 4 MG tablet Take 4 mg by mouth every 4 (four) hours as needed for nausea or vomiting.    Marland Kitchen oxyCODONE (OXY IR/ROXICODONE) 5 MG immediate release tablet Take 1- 2 takes tablets (5-10 mg) once every 3-4 hours as needed for pain. 90 tablet 0  . promethazine (PHENERGAN) 25 MG tablet  Take 25 mg by mouth every 6 (six) hours as needed for nausea or vomiting.     No current facility-administered medications for this visit.    OBJECTIVE: Filed Vitals:   08/17/14 0950  BP: 129/76  Pulse: 68  Temp: 95.4 F (35.2 C)     Body mass index is 17.14 kg/(m^2).    ECOG FS:1 - Symptomatic but completely ambulatory  GENERAL: Patient is alert and oriented and in no acute distress. There is no  icterus. HEENT: EOMs intact. Oral exam negative for thrush or lesions. No cervical lymphadenopathy. CVS: S1S2, regular LUNGS: Bilaterally good air entry, no rhonchi. ABDOMEN: Soft, nontender. No hepatomegaly clinically.  NEURO: grossly nonfocal, cranial nerves are intact.  EXTREMITIES: No pedal edema.  LAB RESULTS: WBC 4.7, Hb 12.7, plts 233, ANC 2.0, Cr 1.01.     Component Value Date/Time   NA 140 08/17/2014 0932   NA 136 07/27/2014 0856   K 3.8 08/17/2014 0932   K 3.9 07/27/2014 0856   CL 104 08/17/2014 0932   CL 104 07/27/2014 0856   CO2 30 08/17/2014 0932   CO2 27 07/27/2014 0856   GLUCOSE 114* 08/17/2014 0932   GLUCOSE 151* 07/27/2014 0856   BUN 12 08/17/2014 0932   BUN 10 07/27/2014 0856   CREATININE 1.03 08/24/2014 0948   CREATININE 0.86 07/27/2014 0856   CREATININE 0.77 07/06/2014   CALCIUM 8.7* 08/17/2014 0932   CALCIUM 8.6* 07/27/2014 0856   PROT 7.0 08/10/2014 0957   PROT 8.8* 06/28/2014 1512   ALBUMIN 3.3* 08/10/2014 0957   ALBUMIN 4.0 06/28/2014 1512   AST 168* 08/10/2014 0957   AST 37 06/28/2014 1512   ALT 166* 08/10/2014 0957   ALT 43 06/28/2014 1512   ALKPHOS 114 08/10/2014 0957   ALKPHOS 53 06/28/2014 1512   BILITOT 0.7 08/10/2014 0957   GFRNONAA >60 08/24/2014 0948   GFRNONAA >60 07/27/2014 0856   GFRAA >60 08/24/2014 0948   GFRAA >60 07/27/2014 0856           STUDIES: Ct Chest W Contrast  08/10/2014   CLINICAL DATA:  Restaging right lung cancer post chemotherapy. Subsequent encounter.  EXAM: CT CHEST WITH CONTRAST  TECHNIQUE: Multidetector CT imaging of the chest was performed during intravenous contrast administration.  CONTRAST:  41m OMNIPAQUE IOHEXOL 300 MG/ML  SOLN  COMPARISON:  PET-CT 06/02/2014.  Chest CT 05/18/2014  FINDINGS: Mediastinum/Nodes: Approximately 10 mm right hilar node on image 27 does not appear significantly changed. There are no enlarged mediastinal, axillary or supraclavicular lymph nodes. The thyroid gland, trachea and  esophagus demonstrate no significant findings. The heart size is normal. There is no pericardial effusion.Left subclavian Port-A-Cath tip near the SVC right atrial junction. There is stable mild atherosclerosis of the aorta, great vessels and coronary arteries.  Lungs/Pleura: There is no pleural effusion. The spiculated hypermetabolic right apical nodule measures 2.7 x 2.0 cm on image 18 (previously 2.8 x 2.2 cm). The nodule medially in the right upper lobe measures 7 mm on image 20 (previously 7 mm). The plaque-like lesion along the superior aspect of the major fissure in the right perihilar region is difficult to measure on the axial images, but appears grossly unchanged. This measures approximately 9 x 15 mm on image 27. Its stability is best demonstrated on sagittal image 47. This was mildly hypermetabolic on PET-CT. No new or enlarging pulmonary nodules identified. There is stable biapical scarring and scattered centrilobular and paraseptal emphysema.  Upper abdomen:  Unremarkable.  There is no  adrenal mass.  Musculoskeletal/Chest wall: The dominant right upper lobe lesion abuts the pleural surface and chest wall extension cannot be excluded. There is no adjacent rib destruction. No distant osseous metastases identified.  IMPRESSION: 1. The dominant spiculated hypermetabolic right apical nodule has slightly decreased in size. This lesion abuts the pleura and may invade the chest wall. 2. The other smaller right upper lobe nodules have not significant changed. These were also hypermetabolic on PET-CT. No new or enlarging nodules identified. 3. Stable small right hilar lymph node, hypermetabolic on PET-CT. 4. No new findings identified.   Electronically Signed   By: Richardean Sale M.D.   On: 08/10/2014 15:49     ASSESSMENT / PLAN:   1. Stage III non-small cell lung cancer, unresectable per d/w Dr.Oaks. Starting on concurrent chemoradiation. 06/14/14 - CT-guided biopsy RUL lung mass. DIAGNOSIS: NON-SMALL CELL  CARCINOMA, FAVOR ADENOCARCINOMA. Note: Napsin A is positive in a subset of neoplastic glands and TTF-1 is focally positive. A p40 stain is obtained but tissue is absent on the slide. IHC staining for p40 is performed and is negative which supports the diagnosis - reviewed labs from today and d/w patient. Blood counts are adequate,will proceed with cycle 3  But will change from cisplatin to carboplatin given minor response on CT scan so far and that we are considering palliative radiation. Will proceed with next chemotherapy with IV Carboplatin and IV Alimta 500 mg/m2 today. Monitor weekly labs, and f/u at 3 weeks with CBC, met-B, and plan next cycle of chemo. 2. Pain - under poor control, plan is as above. 3. Hepatitis C - have forwarded HCV PCR report to hepatitis clinic at [facility] since he states he is unable to see [facility]. 4. In between visits, patient advised to call or come to ER in case of any new symptoms or acute sickness. He is agreeable to this plan.    Leia Alf, MD   08/26/2014 11:08 AM

## 2014-08-30 ENCOUNTER — Emergency Department
Admission: EM | Admit: 2014-08-30 | Discharge: 2014-08-30 | Disposition: A | Discharge status: Home / Self Care | Payer: BLUE CROSS/BLUE SHIELD | Attending: Student | Admitting: Student

## 2014-08-30 ENCOUNTER — Encounter: Payer: Self-pay | Admitting: *Deleted

## 2014-08-30 DIAGNOSIS — S61512A Laceration without foreign body of left wrist, initial encounter: Secondary | ICD-10-CM | POA: Diagnosis present

## 2014-08-30 DIAGNOSIS — Z720 Tobacco use: Secondary | ICD-10-CM | POA: Insufficient documentation

## 2014-08-30 DIAGNOSIS — Z23 Encounter for immunization: Secondary | ICD-10-CM | POA: Diagnosis not present

## 2014-08-30 DIAGNOSIS — Y998 Other external cause status: Secondary | ICD-10-CM | POA: Diagnosis not present

## 2014-08-30 DIAGNOSIS — Y9389 Activity, other specified: Secondary | ICD-10-CM | POA: Diagnosis not present

## 2014-08-30 DIAGNOSIS — Y9289 Other specified places as the place of occurrence of the external cause: Secondary | ICD-10-CM | POA: Insufficient documentation

## 2014-08-30 DIAGNOSIS — Y288XXA Contact with other sharp object, undetermined intent, initial encounter: Secondary | ICD-10-CM | POA: Insufficient documentation

## 2014-08-30 DIAGNOSIS — IMO0002 Reserved for concepts with insufficient information to code with codable children: Secondary | ICD-10-CM

## 2014-08-30 MED ORDER — TETANUS-DIPHTH-ACELL PERTUSSIS 5-2.5-18.5 LF-MCG/0.5 IM SUSP
0.5000 mL | Freq: Once | INTRAMUSCULAR | Status: AC
  Administered 2014-08-30: 0.5 mL via INTRAMUSCULAR

## 2014-08-30 MED ORDER — BACITRACIN 500 UNIT/GM EX OINT
1.0000 "application " | TOPICAL_OINTMENT | Freq: Two times a day (BID) | CUTANEOUS | Status: AC
  Administered 2014-08-30: 1 via TOPICAL

## 2014-08-30 MED ORDER — BACITRACIN ZINC 500 UNIT/GM EX OINT
TOPICAL_OINTMENT | CUTANEOUS | Status: AC
  Administered 2014-08-30: 1 via TOPICAL
  Filled 2014-08-30: qty 0.9

## 2014-08-30 MED ORDER — TETANUS-DIPHTH-ACELL PERTUSSIS 5-2.5-18.5 LF-MCG/0.5 IM SUSP
INTRAMUSCULAR | Status: AC
  Filled 2014-08-30: qty 0.5

## 2014-08-30 MED ORDER — CEPHALEXIN 500 MG PO CAPS
500.0000 mg | ORAL_CAPSULE | Freq: Four times a day (QID) | ORAL | Status: AC
Start: 2014-08-30 — End: 2014-09-09

## 2014-08-30 MED ORDER — LIDOCAINE-EPINEPHRINE (PF) 1 %-1:200000 IJ SOLN
INTRAMUSCULAR | Status: AC
  Filled 2014-08-30: qty 30

## 2014-08-30 NOTE — ED Notes (Signed)
Pt reports he cut his left wrist on a piece of a truck about 15 minutes to arrival. Pt with lac and bleeding to left wrist, compression dressing place and pt moved to treatment room.

## 2014-08-30 NOTE — ED Provider Notes (Signed)
[facility] Emergency Department Provider Note  ____________________________________________  Time seen: Approximately 7:48 PM  I have reviewed the triage vital signs and the nursing notes.   HISTORY  Chief Complaint Extremity Laceration    HPI Mario [name] is a 54 y.o. male with history of lung cancer, kidney stones who presents for evaluation of laceration to the left wrist which occurred suddenly just prior to arrival. The patient was fixing his daughter's car when a jagged metal piece from the body cut his left wrist. He has noted that is bleeding quite a lot. He has mild pain there which is constant. He denies any other complaints. No Modifying factors.   Past Medical History  Diagnosis Date  . Lung cancer   . Arthritis   . Kidney stones   . Chicken pox     Patient Active Problem List   Diagnosis Date Noted  . Lung cancer 08/21/2014  . CALCANEAL FRACTURE 01/21/2008  . ANKLE SPRAIN, RIGHT 01/21/2008    Past Surgical History  Procedure Laterality Date  . Orif femur fracture Right 2010  . Hand surgery Right 2010    ORIF of right index finder, 2nd carpometacarpal joint dislocation right hand, proximal right humerus  . Amputation of index finger  2010    Current Outpatient Rx  Name  Route  Sig  Dispense  Refill  . oxyCODONE (OXY IR/ROXICODONE) 5 MG immediate release tablet      Take 1- 2 takes tablets (5-10 mg) once every 3-4 hours as needed for pain. Patient taking differently: Take 5-10 mg by mouth every 3 (three) hours as needed for severe pain.    90 tablet   0   . cephALEXin (KEFLEX) 500 MG capsule   Oral   Take 1 capsule (500 mg total) by mouth 4 (four) times daily.   28 capsule   0     Allergies Review of patient's allergies indicates no known allergies.  History reviewed. No pertinent family history.  Social History History  Substance Use Topics  . Smoking status: Current Every Day Smoker -- 2.00 packs/day for  20 years  . Smokeless tobacco: Not on file  . Alcohol Use: Yes    Review of Systems Constitutional: No fever/chills Eyes: No visual changes. ENT: No sore throat. Cardiovascular: Denies chest pain. Respiratory: Denies shortness of breath. Gastrointestinal: No abdominal pain.  No nausea, no vomiting.  No diarrhea.  No constipation. Genitourinary: Negative for dysuria. Musculoskeletal: Negative for back pain. Skin: Negative for rash. Neurological: Negative for headaches, focal weakness or numbness.  10-point ROS otherwise negative.  ____________________________________________   PHYSICAL EXAM:  VITAL SIGNS: ED Triage Vitals  Enc Vitals Group     BP 08/30/14 1851 115/67 mmHg     Pulse Rate 08/30/14 1851 99     Resp 08/30/14 1851 16     Temp 08/30/14 1851 98.5 F (36.9 C)     Temp Source 08/30/14 1851 Oral     SpO2 08/30/14 1851 98 %     Weight 08/30/14 1851 120 lb (54.432 kg)     Height 08/30/14 1851 '5\' 9"'$  (1.753 m)     Head Cir --      Peak Flow --      Pain Score --      Pain Loc --      Pain Edu? --      Excl. in [place]? --     Constitutional: Alert and oriented. Well appearing and in no acute distress.  Eyes: Conjunctivae are normal. PERRL. EOMI. Head: Atraumatic. Nose: No congestion/rhinnorhea. Mouth/Throat: Mucous membranes are moist.  Oropharynx non-erythematous. Neck: No stridor. Cardiovascular: Normal rate, regular rhythm. Grossly normal heart sounds.  Good peripheral circulation. Respiratory: Normal respiratory effort.  No retractions. Lungs CTAB. Gastrointestinal: Soft and nontender. No distention. No abdominal bruits. No CVA tenderness. Genitourinary: deferred Musculoskeletal:4 centimeter linear laceration located on the dorsal/radial aspect of the left with a bleeding pulsatile ejection of blood from a nick in a superficial artery, 2+ left radial pulse, wiggles the fingers, intact radial/median/ulnar nerve No joint effusions. Neurologic:  Normal speech and  language. No gross focal neurologic deficits are appreciated. Speech is normal. No gait instability. Skin:  Skin is warm, dry and intact. No rash noted. Psychiatric: Mood and affect are normal. Speech and behavior are normal.  ____________________________________________   LABS (all labs ordered are listed, but only abnormal results are displayed)  Labs Reviewed - No data to display ____________________________________________  EKG  none ____________________________________________  RADIOLOGY  none ____________________________________________   PROCEDURES  Procedure(s) performed: LACERATION REPAIR Performed by: Joanne Gavel Authorized by: Loura Pardon A Consent: Verbal consent obtained. Risks and benefits: risks, benefits and alternatives were discussed Consent given by: patient Patient identity confirmed: provided demographic data Prepped and Draped in normal sterile fashion Wound explored  Laceration Location: dorsal/radial aspect of left wrist  Laceration Length: 4 cm  No Foreign Bodies seen or palpated  Anesthesia: local infiltration  Local anesthetic: lidocaine 1% with epinephrine  Anesthetic total: 3 ml  Irrigation method: syringe Amount of cleaning: standard  Skin closure: 4-0 prolene, 5-0 vicryl used to achieve hemostasis/close   Number of sutures: 3  Technique: simple interrupted  Patient tolerance: Patient tolerated the procedure well with no immediate complications.  Critical Care performed: No  ____________________________________________   INITIAL IMPRESSION / ASSESSMENT AND PLAN / ED COURSE  Pertinent labs & imaging results that were available during my care of the patient were reviewed by me and considered in my medical decision making (see chart for details).  Mario [name] is a 54 y.o. male with history of lung cancer, kidney stones who presents for evaluation of laceration to the left wrist which occurred suddenly just  prior to arrival. On arrival, 4 centimeter linear laceration located on the dorsal/radial aspect of the left with a bleeding pulsatile ejection of blood from a nick in a superficial artery. The arterial nick was repaired with one 5-0 vicryl stitch and x-ray hemostasis was achieved. The remainder of the wound was closed with 4-0 Prolene. We will update his tetanus and observe briefly in the ER.   ----------------------------------------- 9:00 PM on 08/30/2014 ----------------------------------------- I reassessed the patient. His left hand is still warm and well perfused. He is not complaining of any pain. Still neurovascularly intact in the left arm and the wound is not bleeding. We'll discharge with prophylactic Keflex and return precautions.  ____________________________________________   FINAL CLINICAL IMPRESSION(S) / ED DIAGNOSES  Final diagnoses:  Laceration      Joanne Gavel, MD 08/30/14 2109

## 2014-09-02 DIAGNOSIS — C3490 Malignant neoplasm of unspecified part of unspecified bronchus or lung: Secondary | ICD-10-CM | POA: Diagnosis not present

## 2014-09-03 ENCOUNTER — Other Ambulatory Visit: Payer: Self-pay | Admitting: *Deleted

## 2014-09-03 DIAGNOSIS — C3411 Malignant neoplasm of upper lobe, right bronchus or lung: Secondary | ICD-10-CM

## 2014-09-06 ENCOUNTER — Ambulatory Visit
Admission: RE | Admit: 2014-09-06 | Discharge: 2014-09-06 | Disposition: A | Discharge status: Home / Self Care | Payer: BLUE CROSS/BLUE SHIELD | Source: Ambulatory Visit | Attending: Radiation Oncology | Admitting: Radiation Oncology

## 2014-09-06 DIAGNOSIS — C3490 Malignant neoplasm of unspecified part of unspecified bronchus or lung: Secondary | ICD-10-CM | POA: Diagnosis not present

## 2014-09-07 ENCOUNTER — Ambulatory Visit
Admission: RE | Admit: 2014-09-07 | Discharge: 2014-09-07 | Disposition: A | Discharge status: Home / Self Care | Payer: BLUE CROSS/BLUE SHIELD | Source: Ambulatory Visit | Attending: Radiation Oncology | Admitting: Radiation Oncology

## 2014-09-07 DIAGNOSIS — C3490 Malignant neoplasm of unspecified part of unspecified bronchus or lung: Secondary | ICD-10-CM | POA: Diagnosis not present

## 2014-09-08 ENCOUNTER — Ambulatory Visit
Admission: RE | Admit: 2014-09-08 | Discharge: 2014-09-08 | Disposition: A | Discharge status: Home / Self Care | Payer: BLUE CROSS/BLUE SHIELD | Source: Ambulatory Visit | Attending: Radiation Oncology | Admitting: Radiation Oncology

## 2014-09-08 DIAGNOSIS — C3490 Malignant neoplasm of unspecified part of unspecified bronchus or lung: Secondary | ICD-10-CM | POA: Diagnosis not present

## 2014-09-09 ENCOUNTER — Ambulatory Visit
Admission: RE | Admit: 2014-09-09 | Discharge: 2014-09-09 | Disposition: A | Discharge status: Home / Self Care | Payer: BLUE CROSS/BLUE SHIELD | Source: Ambulatory Visit | Attending: Radiation Oncology | Admitting: Radiation Oncology

## 2014-09-09 DIAGNOSIS — C3490 Malignant neoplasm of unspecified part of unspecified bronchus or lung: Secondary | ICD-10-CM | POA: Diagnosis not present

## 2014-09-10 ENCOUNTER — Ambulatory Visit
Admission: RE | Admit: 2014-09-10 | Discharge: 2014-09-10 | Disposition: A | Discharge status: Home / Self Care | Payer: BLUE CROSS/BLUE SHIELD | Source: Ambulatory Visit | Attending: Radiation Oncology | Admitting: Radiation Oncology

## 2014-09-10 DIAGNOSIS — C3490 Malignant neoplasm of unspecified part of unspecified bronchus or lung: Secondary | ICD-10-CM | POA: Diagnosis not present

## 2014-09-13 ENCOUNTER — Ambulatory Visit
Admission: RE | Admit: 2014-09-13 | Discharge: 2014-09-13 | Disposition: A | Discharge status: Home / Self Care | Payer: BLUE CROSS/BLUE SHIELD | Source: Ambulatory Visit | Attending: Radiation Oncology | Admitting: Radiation Oncology

## 2014-09-13 DIAGNOSIS — C3490 Malignant neoplasm of unspecified part of unspecified bronchus or lung: Secondary | ICD-10-CM | POA: Diagnosis not present

## 2014-09-14 ENCOUNTER — Ambulatory Visit
Admission: RE | Admit: 2014-09-14 | Discharge: 2014-09-14 | Disposition: A | Discharge status: Home / Self Care | Payer: BLUE CROSS/BLUE SHIELD | Source: Ambulatory Visit | Attending: Radiation Oncology | Admitting: Radiation Oncology

## 2014-09-14 DIAGNOSIS — C3490 Malignant neoplasm of unspecified part of unspecified bronchus or lung: Secondary | ICD-10-CM | POA: Diagnosis not present

## 2014-09-15 ENCOUNTER — Ambulatory Visit
Admission: RE | Admit: 2014-09-15 | Discharge: 2014-09-15 | Disposition: A | Discharge status: Home / Self Care | Payer: BLUE CROSS/BLUE SHIELD | Source: Ambulatory Visit | Attending: Radiation Oncology | Admitting: Radiation Oncology

## 2014-09-15 DIAGNOSIS — C3490 Malignant neoplasm of unspecified part of unspecified bronchus or lung: Secondary | ICD-10-CM | POA: Diagnosis not present

## 2014-09-16 ENCOUNTER — Encounter: Payer: Self-pay | Admitting: Internal Medicine

## 2014-09-16 ENCOUNTER — Ambulatory Visit
Admission: RE | Admit: 2014-09-16 | Discharge: 2014-09-16 | Disposition: A | Discharge status: Home / Self Care | Payer: BLUE CROSS/BLUE SHIELD | Source: Ambulatory Visit | Attending: Radiation Oncology | Admitting: Radiation Oncology

## 2014-09-16 DIAGNOSIS — C3490 Malignant neoplasm of unspecified part of unspecified bronchus or lung: Secondary | ICD-10-CM | POA: Diagnosis not present

## 2014-09-17 ENCOUNTER — Ambulatory Visit: Payer: BLUE CROSS/BLUE SHIELD

## 2014-09-20 ENCOUNTER — Ambulatory Visit
Admission: RE | Admit: 2014-09-20 | Discharge: 2014-09-20 | Disposition: A | Discharge status: Home / Self Care | Payer: BLUE CROSS/BLUE SHIELD | Source: Ambulatory Visit | Attending: Radiation Oncology | Admitting: Radiation Oncology

## 2014-09-20 DIAGNOSIS — C3490 Malignant neoplasm of unspecified part of unspecified bronchus or lung: Secondary | ICD-10-CM | POA: Diagnosis not present

## 2014-09-21 ENCOUNTER — Ambulatory Visit
Admission: RE | Admit: 2014-09-21 | Discharge: 2014-09-21 | Disposition: A | Discharge status: Home / Self Care | Payer: BLUE CROSS/BLUE SHIELD | Source: Ambulatory Visit | Attending: Radiation Oncology | Admitting: Radiation Oncology

## 2014-09-21 ENCOUNTER — Inpatient Hospital Stay: Payer: No Typology Code available for payment source | Attending: Radiation Oncology

## 2014-09-21 DIAGNOSIS — C3490 Malignant neoplasm of unspecified part of unspecified bronchus or lung: Secondary | ICD-10-CM | POA: Diagnosis not present

## 2014-09-22 ENCOUNTER — Other Ambulatory Visit: Payer: Self-pay | Admitting: *Deleted

## 2014-09-22 ENCOUNTER — Ambulatory Visit
Admission: RE | Admit: 2014-09-22 | Discharge: 2014-09-22 | Disposition: A | Discharge status: Home / Self Care | Payer: BLUE CROSS/BLUE SHIELD | Source: Ambulatory Visit | Attending: Radiation Oncology | Admitting: Radiation Oncology

## 2014-09-22 DIAGNOSIS — C3490 Malignant neoplasm of unspecified part of unspecified bronchus or lung: Secondary | ICD-10-CM | POA: Diagnosis not present

## 2014-09-22 DIAGNOSIS — C3411 Malignant neoplasm of upper lobe, right bronchus or lung: Secondary | ICD-10-CM

## 2014-09-22 MED ORDER — OXYCODONE HCL 5 MG PO TABS: ORAL_TABLET | ORAL | Status: DC

## 2014-09-22 NOTE — Telephone Encounter (Signed)
Pt will pick up at next appt

## 2014-09-23 ENCOUNTER — Inpatient Hospital Stay: Payer: No Typology Code available for payment source | Admitting: Cardiothoracic Surgery

## 2014-09-23 ENCOUNTER — Ambulatory Visit
Admission: RE | Admit: 2014-09-23 | Discharge: 2014-09-23 | Disposition: A | Discharge status: Home / Self Care | Payer: BLUE CROSS/BLUE SHIELD | Source: Ambulatory Visit | Attending: Radiation Oncology | Admitting: Radiation Oncology

## 2014-09-23 ENCOUNTER — Ambulatory Visit: Payer: BLUE CROSS/BLUE SHIELD

## 2014-09-23 DIAGNOSIS — C3412 Malignant neoplasm of upper lobe, left bronchus or lung: Secondary | ICD-10-CM

## 2014-09-23 DIAGNOSIS — C3490 Malignant neoplasm of unspecified part of unspecified bronchus or lung: Secondary | ICD-10-CM | POA: Diagnosis not present

## 2014-09-23 NOTE — Progress Notes (Signed)
Patient came for nursing visit to evaluate "suture sticking out of my port." slight erythema above port a cath site. port a cath tender to touch There is a growth of extra scar tissue above the right side of the port a cath site where the skin has healed. The site measures 2 mm x 2 mm in size and there is a small blue nylon thread sticking out from this cath site. Dr. Genevive Bi to evaluate port a cath. Md at bedside.

## 2014-09-24 ENCOUNTER — Ambulatory Visit
Admission: RE | Admit: 2014-09-24 | Discharge: 2014-09-24 | Disposition: A | Discharge status: Home / Self Care | Payer: BLUE CROSS/BLUE SHIELD | Source: Ambulatory Visit | Attending: Radiation Oncology | Admitting: Radiation Oncology

## 2014-09-24 ENCOUNTER — Ambulatory Visit: Payer: BLUE CROSS/BLUE SHIELD

## 2014-09-24 DIAGNOSIS — C3490 Malignant neoplasm of unspecified part of unspecified bronchus or lung: Secondary | ICD-10-CM | POA: Diagnosis not present

## 2014-09-24 NOTE — Progress Notes (Signed)
Mario [name] Inpatient Post-Op Note  Patient ID: Mario [name], male   DOB: 12/17/1960, 54 y.o.   MRN: [id number]  HISTORY: PortACath placed about 3 months ago.  Has had a stitch sticking out the wound for several weeks.  No fever.  Not using port.  No real pain.   There were no vitals filed for this visit.   EXAM: There is a well healed port incision but a small 5 mm area near mid portion of incision with a Prolene suture sticking out less than one mm.  I grasped the Prolene and cut the suture off at skin level allowing cut end to retract under sterile conditions.  This is a stitch used to secure port to chest wall  ASSESSMENT: Stitch in incision   PLAN:   Patient instructed to call for any redness, fever or pain.  See prn.    Nestor Lewandowsky, MD

## 2014-09-27 ENCOUNTER — Ambulatory Visit
Admission: RE | Admit: 2014-09-27 | Discharge: 2014-09-27 | Disposition: A | Discharge status: Home / Self Care | Payer: BLUE CROSS/BLUE SHIELD | Source: Ambulatory Visit | Attending: Radiation Oncology | Admitting: Radiation Oncology

## 2014-09-27 DIAGNOSIS — C3490 Malignant neoplasm of unspecified part of unspecified bronchus or lung: Secondary | ICD-10-CM | POA: Diagnosis not present

## 2014-09-28 ENCOUNTER — Other Ambulatory Visit: Payer: Self-pay

## 2014-09-28 ENCOUNTER — Inpatient Hospital Stay: Payer: No Typology Code available for payment source

## 2014-09-28 ENCOUNTER — Ambulatory Visit
Admission: RE | Admit: 2014-09-28 | Discharge: 2014-09-28 | Disposition: A | Discharge status: Home / Self Care | Payer: BLUE CROSS/BLUE SHIELD | Source: Ambulatory Visit | Attending: Radiation Oncology | Admitting: Radiation Oncology

## 2014-09-28 ENCOUNTER — Ambulatory Visit: Payer: BLUE CROSS/BLUE SHIELD | Admitting: Radiation Oncology

## 2014-09-28 DIAGNOSIS — C3490 Malignant neoplasm of unspecified part of unspecified bronchus or lung: Secondary | ICD-10-CM | POA: Diagnosis not present

## 2014-09-29 ENCOUNTER — Ambulatory Visit
Admission: RE | Admit: 2014-09-29 | Discharge: 2014-09-29 | Disposition: A | Discharge status: Home / Self Care | Payer: BLUE CROSS/BLUE SHIELD | Source: Ambulatory Visit | Attending: Radiation Oncology | Admitting: Radiation Oncology

## 2014-09-29 DIAGNOSIS — C3490 Malignant neoplasm of unspecified part of unspecified bronchus or lung: Secondary | ICD-10-CM | POA: Diagnosis not present

## 2014-09-30 ENCOUNTER — Ambulatory Visit
Admission: RE | Admit: 2014-09-30 | Discharge: 2014-09-30 | Disposition: A | Discharge status: Home / Self Care | Payer: BLUE CROSS/BLUE SHIELD | Source: Ambulatory Visit | Attending: Radiation Oncology | Admitting: Radiation Oncology

## 2014-09-30 DIAGNOSIS — C3490 Malignant neoplasm of unspecified part of unspecified bronchus or lung: Secondary | ICD-10-CM | POA: Diagnosis not present

## 2014-10-01 ENCOUNTER — Ambulatory Visit
Admission: RE | Admit: 2014-10-01 | Discharge: 2014-10-01 | Disposition: A | Discharge status: Home / Self Care | Payer: BLUE CROSS/BLUE SHIELD | Source: Ambulatory Visit | Attending: Radiation Oncology | Admitting: Radiation Oncology

## 2014-10-01 DIAGNOSIS — C3490 Malignant neoplasm of unspecified part of unspecified bronchus or lung: Secondary | ICD-10-CM | POA: Diagnosis not present

## 2014-10-05 ENCOUNTER — Ambulatory Visit
Admission: RE | Admit: 2014-10-05 | Discharge: 2014-10-05 | Disposition: A | Discharge status: Home / Self Care | Payer: BLUE CROSS/BLUE SHIELD | Source: Ambulatory Visit | Attending: Radiation Oncology | Admitting: Radiation Oncology

## 2014-10-05 ENCOUNTER — Inpatient Hospital Stay: Payer: No Typology Code available for payment source | Attending: Cardiothoracic Surgery

## 2014-10-05 DIAGNOSIS — C3490 Malignant neoplasm of unspecified part of unspecified bronchus or lung: Secondary | ICD-10-CM | POA: Diagnosis not present

## 2014-10-06 ENCOUNTER — Ambulatory Visit
Admission: RE | Admit: 2014-10-06 | Discharge: 2014-10-06 | Disposition: A | Discharge status: Home / Self Care | Payer: BLUE CROSS/BLUE SHIELD | Source: Ambulatory Visit | Attending: Radiation Oncology | Admitting: Radiation Oncology

## 2014-10-06 DIAGNOSIS — C3490 Malignant neoplasm of unspecified part of unspecified bronchus or lung: Secondary | ICD-10-CM | POA: Diagnosis not present

## 2014-10-07 ENCOUNTER — Ambulatory Visit
Admission: RE | Admit: 2014-10-07 | Discharge: 2014-10-07 | Disposition: A | Discharge status: Home / Self Care | Payer: BLUE CROSS/BLUE SHIELD | Source: Ambulatory Visit | Attending: Radiation Oncology | Admitting: Radiation Oncology

## 2014-10-07 DIAGNOSIS — C3490 Malignant neoplasm of unspecified part of unspecified bronchus or lung: Secondary | ICD-10-CM | POA: Diagnosis not present

## 2014-10-07 NOTE — Progress Notes (Unsigned)
PSN met with patient and wife yesterday.  Patient is currently waiting on his Social Security Disability.  Household down to one income.  Patient's electric service has been disconnected, and is unable to pay July rent.  PSN will utilize the bynum fund to assist with both of these needs.

## 2014-10-08 ENCOUNTER — Ambulatory Visit
Admission: RE | Admit: 2014-10-08 | Discharge: 2014-10-08 | Disposition: A | Discharge status: Home / Self Care | Payer: BLUE CROSS/BLUE SHIELD | Source: Ambulatory Visit | Attending: Radiation Oncology | Admitting: Radiation Oncology

## 2014-10-08 DIAGNOSIS — C3490 Malignant neoplasm of unspecified part of unspecified bronchus or lung: Secondary | ICD-10-CM | POA: Diagnosis not present

## 2014-10-11 ENCOUNTER — Ambulatory Visit
Admission: RE | Admit: 2014-10-11 | Discharge: 2014-10-11 | Disposition: A | Discharge status: Home / Self Care | Payer: BLUE CROSS/BLUE SHIELD | Source: Ambulatory Visit | Attending: Radiation Oncology | Admitting: Radiation Oncology

## 2014-10-11 DIAGNOSIS — C3490 Malignant neoplasm of unspecified part of unspecified bronchus or lung: Secondary | ICD-10-CM | POA: Diagnosis not present

## 2014-10-12 ENCOUNTER — Ambulatory Visit: Payer: BLUE CROSS/BLUE SHIELD | Admitting: Radiation Oncology

## 2014-10-12 ENCOUNTER — Ambulatory Visit
Admission: RE | Admit: 2014-10-12 | Discharge: 2014-10-12 | Disposition: A | Discharge status: Home / Self Care | Payer: BLUE CROSS/BLUE SHIELD | Source: Ambulatory Visit | Attending: Radiation Oncology | Admitting: Radiation Oncology

## 2014-10-12 ENCOUNTER — Inpatient Hospital Stay: Payer: No Typology Code available for payment source

## 2014-10-12 DIAGNOSIS — C3490 Malignant neoplasm of unspecified part of unspecified bronchus or lung: Secondary | ICD-10-CM | POA: Diagnosis not present

## 2014-10-12 NOTE — Progress Notes (Unsigned)
Eating well, no c/o pain. Doing well

## 2014-10-13 ENCOUNTER — Ambulatory Visit
Admission: RE | Admit: 2014-10-13 | Discharge: 2014-10-13 | Disposition: A | Discharge status: Home / Self Care | Payer: BLUE CROSS/BLUE SHIELD | Source: Ambulatory Visit | Attending: Radiation Oncology | Admitting: Radiation Oncology

## 2014-10-13 DIAGNOSIS — C3490 Malignant neoplasm of unspecified part of unspecified bronchus or lung: Secondary | ICD-10-CM | POA: Diagnosis not present

## 2014-10-14 ENCOUNTER — Ambulatory Visit
Admission: RE | Admit: 2014-10-14 | Discharge: 2014-10-14 | Disposition: A | Discharge status: Home / Self Care | Payer: BLUE CROSS/BLUE SHIELD | Source: Ambulatory Visit | Attending: Radiation Oncology | Admitting: Radiation Oncology

## 2014-10-14 DIAGNOSIS — C3490 Malignant neoplasm of unspecified part of unspecified bronchus or lung: Secondary | ICD-10-CM | POA: Diagnosis not present

## 2014-10-15 ENCOUNTER — Ambulatory Visit
Admission: RE | Admit: 2014-10-15 | Discharge: 2014-10-15 | Disposition: A | Discharge status: Home / Self Care | Payer: BLUE CROSS/BLUE SHIELD | Source: Ambulatory Visit | Attending: Radiation Oncology | Admitting: Radiation Oncology

## 2014-10-15 DIAGNOSIS — C3490 Malignant neoplasm of unspecified part of unspecified bronchus or lung: Secondary | ICD-10-CM | POA: Diagnosis not present

## 2014-10-18 ENCOUNTER — Ambulatory Visit
Admission: RE | Admit: 2014-10-18 | Discharge: 2014-10-18 | Disposition: A | Discharge status: Home / Self Care | Payer: BLUE CROSS/BLUE SHIELD | Source: Ambulatory Visit | Attending: Radiation Oncology | Admitting: Radiation Oncology

## 2014-10-18 DIAGNOSIS — C3490 Malignant neoplasm of unspecified part of unspecified bronchus or lung: Secondary | ICD-10-CM | POA: Diagnosis not present

## 2014-10-19 ENCOUNTER — Ambulatory Visit
Admission: RE | Admit: 2014-10-19 | Discharge: 2014-10-19 | Disposition: A | Discharge status: Home / Self Care | Payer: BLUE CROSS/BLUE SHIELD | Source: Ambulatory Visit | Attending: Radiation Oncology | Admitting: Radiation Oncology

## 2014-10-19 ENCOUNTER — Ambulatory Visit: Payer: BLUE CROSS/BLUE SHIELD | Admitting: Radiation Oncology

## 2014-10-19 DIAGNOSIS — C3490 Malignant neoplasm of unspecified part of unspecified bronchus or lung: Secondary | ICD-10-CM | POA: Diagnosis not present

## 2014-10-20 ENCOUNTER — Ambulatory Visit
Admission: RE | Admit: 2014-10-20 | Discharge: 2014-10-20 | Disposition: A | Discharge status: Home / Self Care | Payer: BLUE CROSS/BLUE SHIELD | Source: Ambulatory Visit | Attending: Radiation Oncology | Admitting: Radiation Oncology

## 2014-10-20 ENCOUNTER — Ambulatory Visit: Payer: BLUE CROSS/BLUE SHIELD

## 2014-10-20 DIAGNOSIS — C3490 Malignant neoplasm of unspecified part of unspecified bronchus or lung: Secondary | ICD-10-CM | POA: Diagnosis not present

## 2014-10-21 ENCOUNTER — Ambulatory Visit: Payer: BLUE CROSS/BLUE SHIELD

## 2014-10-21 ENCOUNTER — Inpatient Hospital Stay: Payer: No Typology Code available for payment source

## 2014-10-22 ENCOUNTER — Ambulatory Visit
Admission: RE | Admit: 2014-10-22 | Discharge: 2014-10-22 | Disposition: A | Discharge status: Home / Self Care | Payer: BLUE CROSS/BLUE SHIELD | Source: Ambulatory Visit | Attending: Radiation Oncology | Admitting: Radiation Oncology

## 2014-10-22 DIAGNOSIS — C3490 Malignant neoplasm of unspecified part of unspecified bronchus or lung: Secondary | ICD-10-CM | POA: Diagnosis not present

## 2014-10-25 ENCOUNTER — Ambulatory Visit
Admission: RE | Admit: 2014-10-25 | Discharge: 2014-10-25 | Disposition: A | Discharge status: Home / Self Care | Payer: BLUE CROSS/BLUE SHIELD | Source: Ambulatory Visit | Attending: Radiation Oncology | Admitting: Radiation Oncology

## 2014-10-25 DIAGNOSIS — C3490 Malignant neoplasm of unspecified part of unspecified bronchus or lung: Secondary | ICD-10-CM | POA: Diagnosis not present

## 2014-10-26 ENCOUNTER — Ambulatory Visit
Admission: RE | Admit: 2014-10-26 | Discharge: 2014-10-26 | Disposition: A | Discharge status: Home / Self Care | Payer: BLUE CROSS/BLUE SHIELD | Source: Ambulatory Visit | Attending: Radiation Oncology | Admitting: Radiation Oncology

## 2014-10-26 ENCOUNTER — Other Ambulatory Visit: Payer: Self-pay | Admitting: *Deleted

## 2014-10-26 ENCOUNTER — Ambulatory Visit: Payer: BLUE CROSS/BLUE SHIELD

## 2014-10-26 DIAGNOSIS — C3411 Malignant neoplasm of upper lobe, right bronchus or lung: Secondary | ICD-10-CM

## 2014-10-26 DIAGNOSIS — C3490 Malignant neoplasm of unspecified part of unspecified bronchus or lung: Secondary | ICD-10-CM | POA: Diagnosis not present

## 2014-10-26 MED ORDER — OXYCODONE HCL 5 MG PO TABS: ORAL_TABLET | ORAL | Status: DC

## 2014-10-26 NOTE — Telephone Encounter (Signed)
Pt having radiation and needs more pain med for lung cancer pain. Last rx was 6/22 and dr. Mike Gip approved refill and pt given rx

## 2014-10-27 ENCOUNTER — Inpatient Hospital Stay: Payer: No Typology Code available for payment source | Attending: Internal Medicine

## 2014-10-27 ENCOUNTER — Ambulatory Visit: Payer: BLUE CROSS/BLUE SHIELD

## 2014-10-27 ENCOUNTER — Ambulatory Visit
Admission: RE | Admit: 2014-10-27 | Discharge: 2014-10-27 | Disposition: A | Discharge status: Home / Self Care | Payer: BLUE CROSS/BLUE SHIELD | Source: Ambulatory Visit | Attending: Radiation Oncology | Admitting: Radiation Oncology

## 2014-10-27 DIAGNOSIS — Z452 Encounter for adjustment and management of vascular access device: Secondary | ICD-10-CM | POA: Diagnosis not present

## 2014-10-27 DIAGNOSIS — C3412 Malignant neoplasm of upper lobe, left bronchus or lung: Secondary | ICD-10-CM | POA: Diagnosis not present

## 2014-10-27 DIAGNOSIS — C801 Malignant (primary) neoplasm, unspecified: Secondary | ICD-10-CM

## 2014-10-27 DIAGNOSIS — C3490 Malignant neoplasm of unspecified part of unspecified bronchus or lung: Secondary | ICD-10-CM | POA: Diagnosis not present

## 2014-10-27 MED ORDER — SODIUM CHLORIDE 0.9 % IJ SOLN
10.0000 mL | Freq: Once | INTRAMUSCULAR | Status: AC
  Administered 2014-10-27: 10 mL via INTRAVENOUS
  Filled 2014-10-27: qty 10

## 2014-10-27 MED ORDER — HEPARIN SOD (PORK) LOCK FLUSH 100 UNIT/ML IV SOLN
500.0000 [IU] | Freq: Once | INTRAVENOUS | Status: AC
  Administered 2014-10-27: 500 [IU] via INTRAVENOUS

## 2014-10-27 MED ORDER — HEPARIN SOD (PORK) LOCK FLUSH 100 UNIT/ML IV SOLN
INTRAVENOUS | Status: AC
  Filled 2014-10-27: qty 5

## 2014-10-28 ENCOUNTER — Ambulatory Visit
Admission: RE | Admit: 2014-10-28 | Discharge: 2014-10-28 | Disposition: A | Discharge status: Home / Self Care | Payer: BLUE CROSS/BLUE SHIELD | Source: Ambulatory Visit | Attending: Radiation Oncology | Admitting: Radiation Oncology

## 2014-10-28 ENCOUNTER — Ambulatory Visit: Payer: BLUE CROSS/BLUE SHIELD

## 2014-10-28 DIAGNOSIS — C3490 Malignant neoplasm of unspecified part of unspecified bronchus or lung: Secondary | ICD-10-CM | POA: Diagnosis not present

## 2014-10-29 ENCOUNTER — Ambulatory Visit: Payer: BLUE CROSS/BLUE SHIELD

## 2014-11-29 ENCOUNTER — Other Ambulatory Visit: Payer: Self-pay | Admitting: Internal Medicine

## 2014-11-29 ENCOUNTER — Encounter: Payer: Self-pay | Admitting: Radiation Oncology

## 2014-11-29 ENCOUNTER — Ambulatory Visit
Admission: RE | Admit: 2014-11-29 | Discharge: 2014-11-29 | Disposition: A | Discharge status: Home / Self Care | Payer: No Typology Code available for payment source | Source: Ambulatory Visit | Attending: Radiation Oncology | Admitting: Radiation Oncology

## 2014-11-29 VITALS — BP 122/69 | HR 69 | Temp 95.7°F | Resp 18 | Wt 111.1 lb

## 2014-11-29 DIAGNOSIS — C3412 Malignant neoplasm of upper lobe, left bronchus or lung: Secondary | ICD-10-CM

## 2014-11-29 DIAGNOSIS — C3411 Malignant neoplasm of upper lobe, right bronchus or lung: Secondary | ICD-10-CM

## 2014-11-29 MED ORDER — OXYCODONE HCL 5 MG PO TABS: ORAL_TABLET | ORAL | Status: DC

## 2014-11-29 NOTE — Progress Notes (Signed)
Radiation Oncology Follow up Note  Name: Mario [name]   Date:   11/29/2014 MRN:  [id number] DOB: 07/22/1960    This 54 y.o. male presents to the clinic today for follow-up for stage III adenocarcinoma the right lung.  REFERRING PROVIDER: Tracie Harrier, MD  HPI: Patient is a 54 year old male initially presented with right shoulder pain found to have a 2.8 cm in the right upper lobe CT-guided biopsy positive for adenocarcinoma. He just completed radiation therapy after initial cis-platinum chemotherapy with Alimta and is now seen 1 month out and is doing well. Specifically denies hemoptysis chest tightness or shoulder pain. He still has a slight productive cough which we would expect. No dysphagia. .  COMPLICATIONS OF TREATMENT: none  FOLLOW UP COMPLIANCE: keeps appointments   PHYSICAL EXAM:  BP 122/69 mmHg  Pulse 69  Temp(Src) 95.7 F (35.4 C)  Resp 18  Wt 111 lb 1.8 oz (50.4 kg) Well-developed well-nourished patient in NAD. HEENT reveals PERLA, EOMI, discs not visualized.  Oral cavity is clear. No oral mucosal lesions are identified. Neck is clear without evidence of cervical or supraclavicular adenopathy. Lungs are clear to A&P. Cardiac examination is essentially unremarkable with regular rate and rhythm without murmur rub or thrill. Abdomen is benign with no organomegaly or masses noted. Motor sensory and DTR levels are equal and symmetric in the upper and lower extremities. Cranial nerves II through XII are grossly intact. Proprioception is intact. No peripheral adenopathy or edema is identified. No motor or sensory levels are noted. Crude visual fields are within normal range.   RADIOLOGY RESULTS: No films for review  PLAN: At the present time he is recovering nicely from his radiation therapy treatments. I have set up a follow-up appointment with medical oncology. Would like to see a CT scan in the next 2 or 3 months and medical oncology will order that. I'm otherwise  please was overall progress. I've asked to see him back in 4-5 months for follow-up. Patient is to call sooner with any concerns.  I would like to take this opportunity for allowing me to participate in the care of your patient.Armstead Peaks., MD

## 2014-12-07 ENCOUNTER — Inpatient Hospital Stay: Payer: No Typology Code available for payment source | Attending: Internal Medicine

## 2014-12-07 ENCOUNTER — Ambulatory Visit
Admission: RE | Admit: 2014-12-07 | Discharge: 2014-12-07 | Disposition: A | Discharge status: Home / Self Care | Payer: No Typology Code available for payment source | Source: Ambulatory Visit | Attending: Internal Medicine | Admitting: Internal Medicine

## 2014-12-07 DIAGNOSIS — C3411 Malignant neoplasm of upper lobe, right bronchus or lung: Secondary | ICD-10-CM | POA: Insufficient documentation

## 2014-12-07 DIAGNOSIS — Z9221 Personal history of antineoplastic chemotherapy: Secondary | ICD-10-CM | POA: Insufficient documentation

## 2014-12-07 DIAGNOSIS — F17210 Nicotine dependence, cigarettes, uncomplicated: Secondary | ICD-10-CM | POA: Diagnosis not present

## 2014-12-07 DIAGNOSIS — B1920 Unspecified viral hepatitis C without hepatic coma: Secondary | ICD-10-CM | POA: Insufficient documentation

## 2014-12-07 DIAGNOSIS — C3412 Malignant neoplasm of upper lobe, left bronchus or lung: Secondary | ICD-10-CM | POA: Insufficient documentation

## 2014-12-07 DIAGNOSIS — Z8781 Personal history of (healed) traumatic fracture: Secondary | ICD-10-CM | POA: Insufficient documentation

## 2014-12-07 DIAGNOSIS — Z923 Personal history of irradiation: Secondary | ICD-10-CM | POA: Insufficient documentation

## 2014-12-07 DIAGNOSIS — M129 Arthropathy, unspecified: Secondary | ICD-10-CM | POA: Diagnosis not present

## 2014-12-07 DIAGNOSIS — G893 Neoplasm related pain (acute) (chronic): Secondary | ICD-10-CM | POA: Insufficient documentation

## 2014-12-07 DIAGNOSIS — R0602 Shortness of breath: Secondary | ICD-10-CM | POA: Insufficient documentation

## 2014-12-07 DIAGNOSIS — Z87442 Personal history of urinary calculi: Secondary | ICD-10-CM | POA: Diagnosis not present

## 2014-12-07 DIAGNOSIS — Z89021 Acquired absence of right finger(s): Secondary | ICD-10-CM | POA: Diagnosis not present

## 2014-12-07 LAB — CBC WITH DIFFERENTIAL/PLATELET
BASOS PCT: 1 %
Basophils Absolute: 0 10*3/uL (ref 0–0.1)
EOS PCT: 1 %
Eosinophils Absolute: 0.1 10*3/uL (ref 0–0.7)
HCT: 40.3 % (ref 40.0–52.0)
Hemoglobin: 13.3 g/dL (ref 13.0–18.0)
LYMPHS PCT: 33 %
Lymphs Abs: 1.4 10*3/uL (ref 1.0–3.6)
MCH: 29.2 pg (ref 26.0–34.0)
MCHC: 32.9 g/dL (ref 32.0–36.0)
MCV: 88.8 fL (ref 80.0–100.0)
MONO ABS: 0.6 10*3/uL (ref 0.2–1.0)
Monocytes Relative: 15 %
Neutro Abs: 2 10*3/uL (ref 1.4–6.5)
Neutrophils Relative %: 50 %
PLATELETS: 178 10*3/uL (ref 150–440)
RBC: 4.54 MIL/uL (ref 4.40–5.90)
RDW: 13 % (ref 11.5–14.5)
WBC: 4.1 10*3/uL (ref 3.8–10.6)

## 2014-12-07 LAB — BASIC METABOLIC PANEL
ANION GAP: 4 — AB (ref 5–15)
BUN: 17 mg/dL (ref 6–20)
CALCIUM: 8.2 mg/dL — AB (ref 8.9–10.3)
CO2: 28 mmol/L (ref 22–32)
Chloride: 105 mmol/L (ref 101–111)
Creatinine, Ser: 1.01 mg/dL (ref 0.61–1.24)
Glucose, Bld: 100 mg/dL — ABNORMAL HIGH (ref 65–99)
POTASSIUM: 4.5 mmol/L (ref 3.5–5.1)
SODIUM: 137 mmol/L (ref 135–145)

## 2014-12-07 LAB — HEPATIC FUNCTION PANEL
ALBUMIN: 3.6 g/dL (ref 3.5–5.0)
ALT: 93 U/L — AB (ref 17–63)
AST: 84 U/L — AB (ref 15–41)
Alkaline Phosphatase: 57 U/L (ref 38–126)
Bilirubin, Direct: 0.2 mg/dL (ref 0.1–0.5)
Indirect Bilirubin: 0.5 mg/dL (ref 0.3–0.9)
TOTAL PROTEIN: 8.1 g/dL (ref 6.5–8.1)
Total Bilirubin: 0.7 mg/dL (ref 0.3–1.2)

## 2014-12-07 MED ORDER — IOHEXOL 350 MG/ML SOLN
75.0000 mL | Freq: Once | INTRAVENOUS | Status: AC | PRN
  Administered 2014-12-07: 75 mL via INTRAVENOUS

## 2014-12-08 ENCOUNTER — Inpatient Hospital Stay (HOSPITAL_BASED_OUTPATIENT_CLINIC_OR_DEPARTMENT_OTHER): Payer: No Typology Code available for payment source | Admitting: Internal Medicine

## 2014-12-08 ENCOUNTER — Other Ambulatory Visit: Payer: Self-pay | Admitting: Family Medicine

## 2014-12-08 VITALS — BP 130/80 | HR 72 | Temp 97.1°F | Resp 18 | Ht 69.0 in | Wt 114.2 lb

## 2014-12-08 DIAGNOSIS — Z923 Personal history of irradiation: Secondary | ICD-10-CM

## 2014-12-08 DIAGNOSIS — R0602 Shortness of breath: Secondary | ICD-10-CM

## 2014-12-08 DIAGNOSIS — Z9221 Personal history of antineoplastic chemotherapy: Secondary | ICD-10-CM

## 2014-12-08 DIAGNOSIS — F17210 Nicotine dependence, cigarettes, uncomplicated: Secondary | ICD-10-CM

## 2014-12-08 DIAGNOSIS — B1920 Unspecified viral hepatitis C without hepatic coma: Secondary | ICD-10-CM | POA: Diagnosis not present

## 2014-12-08 DIAGNOSIS — Z87442 Personal history of urinary calculi: Secondary | ICD-10-CM

## 2014-12-08 DIAGNOSIS — G893 Neoplasm related pain (acute) (chronic): Secondary | ICD-10-CM | POA: Diagnosis not present

## 2014-12-08 DIAGNOSIS — C3411 Malignant neoplasm of upper lobe, right bronchus or lung: Secondary | ICD-10-CM | POA: Diagnosis not present

## 2014-12-08 DIAGNOSIS — Z89021 Acquired absence of right finger(s): Secondary | ICD-10-CM

## 2014-12-08 DIAGNOSIS — M129 Arthropathy, unspecified: Secondary | ICD-10-CM

## 2014-12-08 DIAGNOSIS — Z8781 Personal history of (healed) traumatic fracture: Secondary | ICD-10-CM

## 2014-12-08 NOTE — Progress Notes (Signed)
Patient is here for follow-up of lung cancer and CT Scan results. Patient states that he continues to have pain in his right side and rates it at 5-6 most of the time. He states that his appetite has been "ok".

## 2014-12-09 ENCOUNTER — Other Ambulatory Visit: Payer: Self-pay | Admitting: Radiology

## 2014-12-09 ENCOUNTER — Ambulatory Visit: Payer: No Typology Code available for payment source | Admitting: Radiation Oncology

## 2014-12-10 ENCOUNTER — Ambulatory Visit
Admission: RE | Admit: 2014-12-10 | Discharge: 2014-12-10 | Disposition: A | Discharge status: Home / Self Care | Payer: BLUE CROSS/BLUE SHIELD | Source: Ambulatory Visit | Attending: Diagnostic Radiology | Admitting: Diagnostic Radiology

## 2014-12-10 ENCOUNTER — Ambulatory Visit
Admission: RE | Admit: 2014-12-10 | Discharge: 2014-12-10 | Disposition: A | Discharge status: Home / Self Care | Payer: BLUE CROSS/BLUE SHIELD | Source: Ambulatory Visit | Attending: Internal Medicine | Admitting: Internal Medicine

## 2014-12-10 DIAGNOSIS — Z9889 Other specified postprocedural states: Secondary | ICD-10-CM

## 2014-12-10 DIAGNOSIS — N200 Calculus of kidney: Secondary | ICD-10-CM | POA: Insufficient documentation

## 2014-12-10 DIAGNOSIS — Z9221 Personal history of antineoplastic chemotherapy: Secondary | ICD-10-CM | POA: Diagnosis not present

## 2014-12-10 DIAGNOSIS — C3411 Malignant neoplasm of upper lobe, right bronchus or lung: Secondary | ICD-10-CM

## 2014-12-10 DIAGNOSIS — Z923 Personal history of irradiation: Secondary | ICD-10-CM | POA: Insufficient documentation

## 2014-12-10 DIAGNOSIS — M1990 Unspecified osteoarthritis, unspecified site: Secondary | ICD-10-CM | POA: Diagnosis not present

## 2014-12-10 LAB — APTT: APTT: 29 s (ref 24–36)

## 2014-12-10 LAB — PROTIME-INR
INR: 1.01
Prothrombin Time: 13.5 seconds (ref 11.4–15.0)

## 2014-12-10 MED ORDER — HYDROCODONE-ACETAMINOPHEN 5-325 MG PO TABS
1.0000 | ORAL_TABLET | ORAL | Status: DC | PRN
  Administered 2014-12-10: 2 via ORAL

## 2014-12-10 MED ORDER — SODIUM CHLORIDE 0.9 % IV SOLN
INTRAVENOUS | Status: DC
  Administered 2014-12-10: 09:00:00 via INTRAVENOUS

## 2014-12-10 NOTE — Discharge Instructions (Signed)
Lung Biopsy °A lung biopsy is a procedure in which a tissue sample is removed from the lung. The tissue can be examined under a microscope to help diagnose various lung disorders.  °LET YOUR HEALTH CARE PROVIDER KNOW ABOUT: °· Any allergies you have. °· All medicines you are taking, including vitamins, herbs, eye drops, creams, and over-the-counter medicines. °· Previous problems you or members of your family have had with the use of anesthetics. °· Any blood disorders or bleeding problems that you have. °· Previous surgeries you have had. °· Medical conditions you have. °RISKS AND COMPLICATIONS °Generally, a lung biopsy is a safe procedure. However, problems can occur and include: °· Collapse of the lung.   °· Bleeding.   °· Infection.   °BEFORE THE PROCEDURE °· Do not eat or drink anything after midnight on the night before the procedure or as directed by your health care provider. °· Ask your health care provider about changing or stopping your regular medicines. This is especially important if you are taking diabetes medicines or blood thinners. °· Plan to have someone take you home after the procedure. °PROCEDURE °Various methods can be used to perform a lung biopsy:  °· Needle biopsy. A biopsy needle is inserted into the lung. The needle is used to collect the tissue sample. A CT scanner may be used to guide the needle to the right place in the lung. For this method, a medicine is used to numb the area where the biopsy sample will be taken (local anesthetic). °· Bronchoscopy. A flexible tube (bronchoscope) is inserted into your lungs by going through your mouth or nose. A needle or forceps is passed through the bronchoscope to remove the tissue sample. For this method, medicine may be used to numb the back of your throat. °· Open biopsy. A cut (incision) is made in your chest. The tissue sample is then removed using surgical tools. The incision is closed with skin glue, skin adhesive strips, or stitches. For  this method, you will be given medicine to make you sleep through the procedure (general anesthetic). °AFTER THE PROCEDURE °· Your recovery will be assessed and monitored. °· You might have soreness and tenderness at the site of the biopsy for a few days after the procedure. °· You might have a cough and some soreness in your throat for a few days if a bronchoscope was used. °Document Released: 06/07/2004 Document Revised: 08/03/2013 Document Reviewed: 08/31/2012 °ExitCare® Patient Information ©2015 [employer]. This information is not intended to replace advice given to you by your health care provider. Make sure you discuss any questions you have with your health care provider. ° °

## 2014-12-10 NOTE — Procedures (Signed)
CT guided right upper lobe lung biopsy  Complications:  None  Blood Loss: none  See dictation in canopy pacs

## 2014-12-13 ENCOUNTER — Other Ambulatory Visit: Payer: Self-pay | Admitting: Internal Medicine

## 2014-12-13 ENCOUNTER — Ambulatory Visit: Payer: No Typology Code available for payment source | Admitting: Internal Medicine

## 2014-12-13 DIAGNOSIS — C3411 Malignant neoplasm of upper lobe, right bronchus or lung: Secondary | ICD-10-CM

## 2014-12-13 LAB — SURGICAL PATHOLOGY

## 2014-12-13 NOTE — Progress Notes (Signed)
CT-guided lung biopsy on 12/10/14. Pathology report.  DIAGNOSIS:  A. LUNG MASS, RIGHT UPPER LOBE; CT-GUIDED BIOPSY FOR MOLECULAR TESTING:  - MALIGNANCY IS NOT PRESENT.  - BIOPSY CORES CONTAIN REACTIVE FIBROUS TISSUE.   Have discussed results with patient in clinic today, explained that there is no malignancy to submit for molecular testing. Given the negative biopsy, plan is continued surveillance at this time and is scheduled for surveillance CT scan of the chest at about 12 weeks from the last one and see him back after it is done, to make further plan of management.     In between visits, the patient has been advised to call or come to the ER in case of new symptoms or acute sickness. Patient is agreeable to this plan.

## 2014-12-23 NOTE — Progress Notes (Signed)
[facility]  [phone]) [phone] [phone]) [phone]     ID: Mario [name] OB: [birth year]  MR#: [id number]  VQQ#:[id number]  Patient Care Team: Tracie Harrier, MD as PCP - General (Internal Medicine)  CHIEF COMPLAINT/DIAGNOSIS:  Stage III non-small cell lung cancer, unresectable per d/w Dr.Oaks. Starting on concurrent chemoradiation. (presented with spiculated 2.8 x 2.2 cm right upper lobe lung nodule found on CT scan of the chest with contrast on 05/18/2014. Chronic smoking history).  06/14/14 - CT-guided biopsy RUL lung mass. DIAGNOSIS: NON-SMALL CELL CARCINOMA, FAVOR ADENOCARCINOMA. Note: Napsin A is positive in a subset of neoplastic glands and TTF-1 is focally positive. A p40 stain is obtained but tissue is absent on the slide. IHC staining for p40 is performed and is negative which supports the diagnosis.    HISTORY OF PRESENT ILLNESS:  Patient returns for continued oncology evaluation, he has completed XRT few weeks ago. States that he is doing steady, pain is under good control, it fluctuates pain as 4-9/10. States he is eating well. States cough is better overall, denies any new chest pain or hemoptysis. He has mild dyspnea on physical activity only. No fever. No nausea or vomiting. No diarrhea.    REVIEW OF SYSTEMS:   ROS As in HPI above. In addition, no fever, chills. No new headaches or focal weakness.  No new mood disturbances. No  sore throat, dysphagia or hemoptysis. No abdominal pain, constipation, diarrhea, dysuria or hematuria. No new skin rash or bleeding symptoms. No new paresthesias in extremities. PS ECOG 1.  PAST MEDICAL HISTORY: Past Medical History  Diagnosis Date  . Lung cancer   . Arthritis   . Kidney stones   . Chicken pox           History of kidney stones  Chickenpox  Arthritis in the knees  Fracture right proximal femur status post ORIF September 2010  Fracture right index finger status post ORIF September 2010  Fracture  surgery September 2010 including the right index finger PIP joint, second carpometacarpal joint dislocation right hand, proximal right humerus  Removal of humeral nail and amputation of index finger November 2010  PAST SURGICAL HISTORY: Past Surgical History  Procedure Laterality Date  . Orif femur fracture Right 2010  . Hand surgery Right 2010    ORIF of right index finder, 2nd carpometacarpal joint dislocation right hand, proximal right humerus  . Amputation of index finger  2010    FAMILY HISTORY: noncontributory, denies malignancy.   SOCIAL HISTORY: Social History  Substance Use Topics  . Smoking status: Current Every Day Smoker -- 2.00 packs/day for 20 years  . Smokeless tobacco: Not on file  . Alcohol Use: Yes  Chronic smoker half pack per day 20 years. Drinks 12 pack beer a week. History of smoking weed, quit few months ago. Physically active.  No Known Allergies  Current Outpatient Prescriptions  Medication Sig Dispense Refill  . oxyCODONE (OXY IR/ROXICODONE) 5 MG immediate release tablet Take 1- 2 takes tablets (5-10 mg) once every 3-4 hours as needed for pain. 90 tablet 0  . promethazine (PHENERGAN) 25 MG tablet Take 25 mg by mouth every 6 (six) hours as needed for nausea or vomiting.     No current facility-administered medications for this visit.    OBJECTIVE: Filed Vitals:   12/08/14 0910  BP: 130/80  Pulse: 72  Temp: 97.1 F (36.2 C)  Resp: 18     Body mass index is 16.86 kg/(m^2).    ECOG FS:1 -  Symptomatic but completely ambulatory  GENERAL: Alert and oriented and in no acute distress. No icterus. HEENT: EOMs intact. No cervical lymphadenopathy. CVS: S1S2, regular LUNGS: Bilaterally good air entry, no creps or rhonchi. ABDOMEN: Soft, nontender.    EXTREMITIES: No pedal edema.  LAB RESULTS: WBC 4.1, Hb 13.3, plts 178, ANC 2.0, Cr 1.01.     Component Value Date/Time   NA 137 12/07/2014 0915   NA 136 07/27/2014 0856   K 4.5 12/07/2014 0915   K  3.9 07/27/2014 0856   CL 105 12/07/2014 0915   CL 104 07/27/2014 0856   CO2 28 12/07/2014 0915   CO2 27 07/27/2014 0856   GLUCOSE 100* 12/07/2014 0915   GLUCOSE 151* 07/27/2014 0856   BUN 17 12/07/2014 0915   BUN 10 07/27/2014 0856   CREATININE 1.01 12/07/2014 0915   CREATININE 0.86 07/27/2014 0856   CREATININE 0.77 07/06/2014   CALCIUM 8.2* 12/07/2014 0915   CALCIUM 8.6* 07/27/2014 0856   PROT 8.1 12/07/2014 0915   PROT 8.8* 06/28/2014 1512   ALBUMIN 3.6 12/07/2014 0915   ALBUMIN 4.0 06/28/2014 1512   AST 84* 12/07/2014 0915   AST 37 06/28/2014 1512   ALT 93* 12/07/2014 0915   ALT 43 06/28/2014 1512   ALKPHOS 57 12/07/2014 0915   ALKPHOS 53 06/28/2014 1512   BILITOT 0.7 12/07/2014 0915   BILITOT 0.3 06/28/2014 1512   GFRNONAA >60 12/07/2014 0915   GFRNONAA >60 07/27/2014 0856   GFRAA >60 12/07/2014 0915   GFRAA >60 07/27/2014 0856           STUDIES: 12/07/14 - CT Chest.  FINDINGS:  Mediastinum: The heart size is normal. No pericardial effusion. The trachea appears patent and is midline. Normal appearance of the esophagus. Index right hilar node measures 7 mm, image 27/series 2.  Previously 1 cm.  Lungs/Pleura: Moderate changes of centrilobular emphysema. The right upper lobe lung lesion measures 2.5 cm, image 16 of series 3. Previously 2.8 cm. Subpleural nodule along the major fissure measures 1.5 x 0.6 cm, image 50 of series 6. Previously this measured the same. Upper Abdomen: No suspicious liver abnormality. The gallbladder is normal. There is no biliary dilatation. Musculoskeletal: There is no aggressive lytic or sclerotic bone lesion identified.  IMPRESSION: 1. The right upper lobe lung lesion is slightly decreased in size from previous exam.  2. The other small right upper lobe subpleural nodule is stable in the interval.   ASSESSMENT / PLAN:   1. Stage III non-small cell lung cancer, unresectable per d/w Dr.Oaks. Starting on concurrent chemoradiation. 06/14/14 -  CT-guided biopsy RUL lung mass. DIAGNOSIS: NON-SMALL CELL CARCINOMA, FAVOR ADENOCARCINOMA. Note: Napsin A is positive in a subset of neoplastic glands and TTF-1 is focally positive. A p40 stain is obtained but tissue is absent on the slide. IHC staining for p40 is performed and is negative which supports the diagnosis - reviewed labs from today and CT chest from yesterday and d/w patient. He is s/p chemo and XRT. CT scan s/o minor response in RUL lung mass. Given this, have discussed we could look into targeted therapies but will need repeat biopsy for tissue specimen for molecular testing (EGFR, ALK w/reflex) since there is not sufficient tissue on original biopsy specimen. He is agreeable, will request CT-guided biopsy and molecular testing if there is malignancy. Will f/u at 3 weeks and make further plan of management. 2. Pain - under poor control, plan is as above. 3. Hepatitis C - have forwarded HCV PCR  report to hepatitis clinic at [facility] since he states he is unable to see [facility]. 4. In between visits, patient advised to call or come to ER in case of any new symptoms or acute sickness. He is agreeable to this plan.    Leia Alf, MD   12/23/2014 9:06 AM

## 2014-12-24 ENCOUNTER — Other Ambulatory Visit: Payer: Self-pay | Admitting: Internal Medicine

## 2014-12-24 ENCOUNTER — Telehealth: Payer: Self-pay | Admitting: *Deleted

## 2014-12-24 DIAGNOSIS — C3412 Malignant neoplasm of upper lobe, left bronchus or lung: Secondary | ICD-10-CM

## 2014-12-24 DIAGNOSIS — C3411 Malignant neoplasm of upper lobe, right bronchus or lung: Secondary | ICD-10-CM

## 2014-12-24 MED ORDER — OXYCODONE HCL 5 MG PO TABS: ORAL_TABLET | ORAL | Status: DC

## 2014-12-24 NOTE — Telephone Encounter (Signed)
Pt wants refill of pain med and left message with answering service at lunch today. Called pt back and he can come pick it up and he does not have this afternoon and will come on monday

## 2014-12-29 ENCOUNTER — Inpatient Hospital Stay: Payer: No Typology Code available for payment source | Admitting: Internal Medicine

## 2014-12-29 NOTE — Progress Notes (Signed)
Patient is here for follow-up. He states that he still has pain in his neck and rates it a 6/10. He states that it is a constant pain. He states that his appetite is good. His pain medication does make him want to sleep a lot.

## 2015-01-11 ENCOUNTER — Other Ambulatory Visit: Payer: Self-pay | Admitting: Family Medicine

## 2015-01-11 ENCOUNTER — Telehealth: Payer: Self-pay | Admitting: *Deleted

## 2015-01-11 DIAGNOSIS — C3412 Malignant neoplasm of upper lobe, left bronchus or lung: Secondary | ICD-10-CM

## 2015-01-11 DIAGNOSIS — C3411 Malignant neoplasm of upper lobe, right bronchus or lung: Secondary | ICD-10-CM

## 2015-01-11 MED ORDER — OXYCODONE HCL 10 MG PO TABS: ORAL_TABLET | ORAL | Status: DC

## 2015-01-11 NOTE — Telephone Encounter (Signed)
Pt walked in requesting refill of pain med.  It has only been 18 days since last med refill and I asked pt how many is he taking a day and he 5-6 every day. Spoke to [facility] [facility] NP and she inc. Pill to 10 mg tablets every 3-4 hours and pt can see how that works for him.

## 2015-01-16 ENCOUNTER — Emergency Department: Payer: No Typology Code available for payment source

## 2015-01-16 ENCOUNTER — Emergency Department
Admission: EM | Admit: 2015-01-16 | Discharge: 2015-01-16 | Disposition: A | Discharge status: Home / Self Care | Payer: No Typology Code available for payment source | Attending: Emergency Medicine | Admitting: Emergency Medicine

## 2015-01-16 DIAGNOSIS — S6991XA Unspecified injury of right wrist, hand and finger(s), initial encounter: Secondary | ICD-10-CM | POA: Diagnosis present

## 2015-01-16 DIAGNOSIS — Y9389 Activity, other specified: Secondary | ICD-10-CM | POA: Insufficient documentation

## 2015-01-16 DIAGNOSIS — Y9289 Other specified places as the place of occurrence of the external cause: Secondary | ICD-10-CM | POA: Insufficient documentation

## 2015-01-16 DIAGNOSIS — Y998 Other external cause status: Secondary | ICD-10-CM | POA: Diagnosis not present

## 2015-01-16 DIAGNOSIS — Z720 Tobacco use: Secondary | ICD-10-CM | POA: Insufficient documentation

## 2015-01-16 DIAGNOSIS — S62306A Unspecified fracture of fifth metacarpal bone, right hand, initial encounter for closed fracture: Secondary | ICD-10-CM | POA: Diagnosis not present

## 2015-01-16 DIAGNOSIS — W1839XA Other fall on same level, initial encounter: Secondary | ICD-10-CM | POA: Insufficient documentation

## 2015-01-16 DIAGNOSIS — S62304A Unspecified fracture of fourth metacarpal bone, right hand, initial encounter for closed fracture: Secondary | ICD-10-CM | POA: Insufficient documentation

## 2015-01-16 DIAGNOSIS — S62309A Unspecified fracture of unspecified metacarpal bone, initial encounter for closed fracture: Secondary | ICD-10-CM

## 2015-01-16 MED ORDER — HYDROCODONE-ACETAMINOPHEN 5-325 MG PO TABS: 1.0000 | ORAL_TABLET | ORAL | Status: DC | PRN

## 2015-01-16 MED ORDER — HYDROCODONE-ACETAMINOPHEN 5-325 MG PO TABS
1.0000 | ORAL_TABLET | Freq: Once | ORAL | Status: AC
Start: 2015-01-16 — End: 2015-01-16
  Administered 2015-01-16: 1 via ORAL
  Filled 2015-01-16: qty 1

## 2015-01-16 NOTE — Discharge Instructions (Signed)
Metacarpal Fracture A metacarpal fracture is a break (fracture) of a bone in the hand. Metacarpals are the bones that extend from your knuckles to your wrist. In each hand, you have five metacarpal bones that connect your fingers and your thumb to your wrist. Some hand fractures have bone pieces that are close together and stable (simple). These fractures may be treated with only a splint or cast. Hand fractures that have many pieces of broken bone (comminuted), unstable bone pieces (displaced), or a bone that breaks through the skin (compound) usually require surgery. CAUSES This injury may be caused by:  A fall.  A hard, direct hit to your hand.  An injury that squeezes your knuckle, stretches your finger out of place, or crushes your hand. RISK FACTORS This injury is more likely to occur if:  You play contact sports.  You have certain bone diseases. SYMPTOMS  Symptoms of this type of fracture develop soon after the injury. Symptoms may include:  Swelling.  Pain.  Stiffness.  Increased pain with movement.  Bruising.  Inability to move a finger.  A shortened finger.  A finger knuckle that looks sunken in.  Unusual appearance of the hand or finger (deformity). DIAGNOSIS  This injury may be diagnosed based on your signs and symptoms, especially if you had a recent hand injury. Your health care provider will perform a physical exam. He or she may also order X-rays to confirm the diagnosis.  TREATMENT  Treatment for this injury depends on the type of fracture you have and how severe it is. Possible treatments include:  Non-reduction. This can be done if the bone does not need to be moved back into place. The fracture can be casted or splinted as it is.   Closed reduction. If your bone is stable and can be moved back into place, you may only need to wear a cast or splint or have buddy taping.  Closed reduction with internal fixation (CRIF). This is the most common  treatment. You may have this procedure if your bone can be moved back into place but needs more support. Wires, pins, or screws may be inserted through your skin to stabilize the fracture.  Open reduction with internal fixation (ORIF). This may be needed if your fracture is severe and unstable. It involves surgery to move your bone back into the right position. Screws, wires, or plates are used to stabilize the fracture. After all procedures, you may need to wear a cast or a splint for several weeks. You will also need to have follow-up X-rays to make sure that the bone is healing well and staying in position. After you no longer need your cast or splint, you may need physical therapy. This will help you to regain full movement and strength in your hand.  HOME CARE INSTRUCTIONS  If You Have a Cast:  Do not stick anything inside the cast to scratch your skin. Doing that increases your risk of infection.  Check the skin around the cast every day. Report any concerns to your health care provider. You may put lotion on dry skin around the edges of the cast. Do not apply lotion to the skin underneath the cast. If You Have a Splint:  Wear it as directed by your health care provider. Remove it only as directed by your health care provider.  Loosen the splint if your fingers become numb and tingle, or if they turn cold and blue. Bathing  Cover the cast or splint with a  watertight plastic bag to protect it from water while you take a bath or a shower. Do not let the cast or splint get wet. Managing Pain, Stiffness, and Swelling  If directed, apply ice to the injured area (if you have a splint, not a cast):  Put ice in a plastic bag.  Place a towel between your skin and the bag.  Leave the ice on for 20 minutes, 2-3 times a day.  Move your fingers often to avoid stiffness and to lessen swelling.  Raise the injured area above the level of your heart while you are sitting or lying  down. Driving  Do not drive or operate heavy machinery while taking pain medicine.  Do not drive while wearing a cast or splint on a hand that you use for driving. Activity  Return to your normal activities as directed by your health care provider. Ask your health care provider what activities are safe for you. General Instructions  Do not put pressure on any part of the cast or splint until it is fully hardened. This may take several hours.  Keep the cast or splint clean and dry.  Do not use any tobacco products, including cigarettes, chewing tobacco, or electronic cigarettes. Tobacco can delay bone healing. If you need help quitting, ask your health care provider.  Take medicines only as directed by your health care provider.  Keep all follow-up visits as directed by your health care provider. This is important. SEEK MEDICAL CARE IF:   Your pain is getting worse.  You have redness, swelling, or pain in the injured area.   You have fluid, blood, or pus coming from under your cast or splint.   You notice a bad smell coming from under your cast or splint.   You have a fever.  SEEK IMMEDIATE MEDICAL CARE IF:   You develop a rash.   You have trouble breathing.   Your skin or nails on your injured hand turn blue or gray even after you loosen your splint.  Your injured hand feels cold or becomes numb even after you loosen your splint.   You develop severe pain under the cast or in your hand.   This information is not intended to replace advice given to you by your health care provider. Make sure you discuss any questions you have with your health care provider.   Document Released: 03/19/2005 Document Revised: 12/08/2014 Document Reviewed: 01/06/2014 Elsevier Interactive Patient Education [employer].

## 2015-01-16 NOTE — ED Notes (Signed)
AAOx3.  Skin warm and dry.  NAD 

## 2015-01-16 NOTE — ED Notes (Signed)
Pt states he was fishing yesterday and his feet got tangled in vines and he fell on his right hand. Today with pain and swelling to right hand
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
## 2015-02-03 ENCOUNTER — Encounter: Payer: Self-pay | Admitting: *Deleted

## 2015-02-03 ENCOUNTER — Telehealth: Payer: Self-pay | Admitting: *Deleted

## 2015-02-03 ENCOUNTER — Other Ambulatory Visit: Payer: Self-pay | Admitting: Internal Medicine

## 2015-02-03 DIAGNOSIS — C3411 Malignant neoplasm of upper lobe, right bronchus or lung: Secondary | ICD-10-CM

## 2015-02-03 MED ORDER — HYDROCODONE-ACETAMINOPHEN 5-325 MG PO TABS: 1.0000 | ORAL_TABLET | Freq: Four times a day (QID) | ORAL | Status: DC | PRN

## 2015-02-03 NOTE — Telephone Encounter (Signed)
Patient came to cancer center and spoke with receptionist. The patient stated that "I need both pain meds filled."  Spoke with Dr. Rogue Bussing. He will RF the vicodin only.  RX RF for vicodin RF and patient can come to cancer ctr to pick RX up.  Patient has an appointment at the end of Nov. To f/u for his lung cancer, ct scan and md appointments.

## 2015-02-08 NOTE — Progress Notes (Signed)
This encounter was created in error - please disregard.

## 2015-02-11 ENCOUNTER — Other Ambulatory Visit: Payer: Self-pay | Admitting: Internal Medicine

## 2015-02-11 ENCOUNTER — Encounter: Payer: Self-pay | Admitting: *Deleted

## 2015-02-11 DIAGNOSIS — C3411 Malignant neoplasm of upper lobe, right bronchus or lung: Secondary | ICD-10-CM

## 2015-02-11 DIAGNOSIS — C3412 Malignant neoplasm of upper lobe, left bronchus or lung: Secondary | ICD-10-CM

## 2015-02-11 MED ORDER — OXYCODONE HCL 10 MG PO TABS: ORAL_TABLET | ORAL | Status: DC

## 2015-03-01 ENCOUNTER — Ambulatory Visit
Admission: RE | Admit: 2015-03-01 | Discharge: 2015-03-01 | Disposition: A | Discharge status: Home / Self Care | Payer: No Typology Code available for payment source | Source: Ambulatory Visit | Attending: Internal Medicine | Admitting: Internal Medicine

## 2015-03-01 ENCOUNTER — Other Ambulatory Visit: Payer: Self-pay | Admitting: *Deleted

## 2015-03-01 DIAGNOSIS — Z9221 Personal history of antineoplastic chemotherapy: Secondary | ICD-10-CM | POA: Insufficient documentation

## 2015-03-01 DIAGNOSIS — R918 Other nonspecific abnormal finding of lung field: Secondary | ICD-10-CM | POA: Insufficient documentation

## 2015-03-01 DIAGNOSIS — C3411 Malignant neoplasm of upper lobe, right bronchus or lung: Secondary | ICD-10-CM | POA: Diagnosis present

## 2015-03-01 MED ORDER — IOHEXOL 350 MG/ML SOLN
50.0000 mL | Freq: Once | INTRAVENOUS | Status: AC | PRN
  Administered 2015-03-01: 50 mL via INTRAVENOUS

## 2015-03-02 ENCOUNTER — Encounter: Payer: Self-pay | Admitting: Internal Medicine

## 2015-03-02 ENCOUNTER — Inpatient Hospital Stay: Payer: No Typology Code available for payment source | Attending: Internal Medicine | Admitting: Internal Medicine

## 2015-03-02 ENCOUNTER — Inpatient Hospital Stay: Payer: No Typology Code available for payment source

## 2015-03-02 VITALS — BP 103/57 | HR 67 | Temp 97.3°F | Resp 18 | Ht 69.0 in | Wt 115.3 lb

## 2015-03-02 DIAGNOSIS — Z9221 Personal history of antineoplastic chemotherapy: Secondary | ICD-10-CM | POA: Diagnosis not present

## 2015-03-02 DIAGNOSIS — Z923 Personal history of irradiation: Secondary | ICD-10-CM | POA: Diagnosis not present

## 2015-03-02 DIAGNOSIS — J432 Centrilobular emphysema: Secondary | ICD-10-CM | POA: Insufficient documentation

## 2015-03-02 DIAGNOSIS — J189 Pneumonia, unspecified organism: Secondary | ICD-10-CM

## 2015-03-02 DIAGNOSIS — C3411 Malignant neoplasm of upper lobe, right bronchus or lung: Secondary | ICD-10-CM | POA: Diagnosis not present

## 2015-03-02 DIAGNOSIS — G8929 Other chronic pain: Secondary | ICD-10-CM | POA: Diagnosis not present

## 2015-03-02 DIAGNOSIS — R918 Other nonspecific abnormal finding of lung field: Secondary | ICD-10-CM | POA: Diagnosis not present

## 2015-03-02 DIAGNOSIS — C3412 Malignant neoplasm of upper lobe, left bronchus or lung: Secondary | ICD-10-CM

## 2015-03-02 DIAGNOSIS — Z95828 Presence of other vascular implants and grafts: Secondary | ICD-10-CM

## 2015-03-02 DIAGNOSIS — R0602 Shortness of breath: Secondary | ICD-10-CM | POA: Insufficient documentation

## 2015-03-02 DIAGNOSIS — Z79899 Other long term (current) drug therapy: Secondary | ICD-10-CM | POA: Insufficient documentation

## 2015-03-02 DIAGNOSIS — R05 Cough: Secondary | ICD-10-CM | POA: Insufficient documentation

## 2015-03-02 DIAGNOSIS — I700 Atherosclerosis of aorta: Secondary | ICD-10-CM | POA: Insufficient documentation

## 2015-03-02 DIAGNOSIS — Z8781 Personal history of (healed) traumatic fracture: Secondary | ICD-10-CM | POA: Diagnosis not present

## 2015-03-02 DIAGNOSIS — B1920 Unspecified viral hepatitis C without hepatic coma: Secondary | ICD-10-CM | POA: Insufficient documentation

## 2015-03-02 DIAGNOSIS — Z87442 Personal history of urinary calculi: Secondary | ICD-10-CM | POA: Diagnosis not present

## 2015-03-02 DIAGNOSIS — M129 Arthropathy, unspecified: Secondary | ICD-10-CM | POA: Insufficient documentation

## 2015-03-02 DIAGNOSIS — F17210 Nicotine dependence, cigarettes, uncomplicated: Secondary | ICD-10-CM | POA: Diagnosis not present

## 2015-03-02 DIAGNOSIS — M2550 Pain in unspecified joint: Secondary | ICD-10-CM | POA: Insufficient documentation

## 2015-03-02 LAB — CBC WITH DIFFERENTIAL/PLATELET
BASOS ABS: 0 10*3/uL (ref 0–0.1)
Basophils Relative: 1 %
EOS ABS: 0.2 10*3/uL (ref 0–0.7)
EOS PCT: 3 %
HCT: 37.7 % — ABNORMAL LOW (ref 40.0–52.0)
Hemoglobin: 12.3 g/dL — ABNORMAL LOW (ref 13.0–18.0)
Lymphocytes Relative: 36 %
Lymphs Abs: 1.7 10*3/uL (ref 1.0–3.6)
MCH: 28.9 pg (ref 26.0–34.0)
MCHC: 32.6 g/dL (ref 32.0–36.0)
MCV: 88.8 fL (ref 80.0–100.0)
Monocytes Absolute: 1.1 10*3/uL — ABNORMAL HIGH (ref 0.2–1.0)
Monocytes Relative: 23 %
Neutro Abs: 1.8 10*3/uL (ref 1.4–6.5)
Neutrophils Relative %: 37 %
PLATELETS: 176 10*3/uL (ref 150–440)
RBC: 4.25 MIL/uL — AB (ref 4.40–5.90)
RDW: 12.8 % (ref 11.5–14.5)
WBC: 4.8 10*3/uL (ref 3.8–10.6)

## 2015-03-02 LAB — HEPATIC FUNCTION PANEL
ALK PHOS: 45 U/L (ref 38–126)
ALT: 68 U/L — ABNORMAL HIGH (ref 17–63)
AST: 64 U/L — ABNORMAL HIGH (ref 15–41)
Albumin: 3.5 g/dL (ref 3.5–5.0)
BILIRUBIN INDIRECT: 0.6 mg/dL (ref 0.3–0.9)
Bilirubin, Direct: 0.1 mg/dL (ref 0.1–0.5)
TOTAL PROTEIN: 7.9 g/dL (ref 6.5–8.1)
Total Bilirubin: 0.7 mg/dL (ref 0.3–1.2)

## 2015-03-02 LAB — CREATININE, SERUM
CREATININE: 1.01 mg/dL (ref 0.61–1.24)
GFR calc non Af Amer: >60 mL/min (ref >60)

## 2015-03-02 MED ORDER — LEVOFLOXACIN 500 MG PO TABS: 500.0000 mg | ORAL_TABLET | Freq: Every day | ORAL | Status: DC

## 2015-03-02 MED ORDER — OXYCODONE HCL 10 MG PO TABS: ORAL_TABLET | ORAL | Status: DC

## 2015-03-02 MED ORDER — HEPARIN SOD (PORK) LOCK FLUSH 100 UNIT/ML IV SOLN
INTRAVENOUS | Status: AC
  Filled 2015-03-02: qty 5

## 2015-03-02 MED ORDER — SODIUM CHLORIDE 0.9 % IJ SOLN
10.0000 mL | INTRAMUSCULAR | Status: AC | PRN
  Administered 2015-03-02: 10 mL
  Filled 2015-03-02: qty 10

## 2015-03-02 MED ORDER — HEPARIN SOD (PORK) LOCK FLUSH 100 UNIT/ML IV SOLN
500.0000 [IU] | INTRAVENOUS | Status: AC | PRN
  Administered 2015-03-02: 500 [IU]

## 2015-03-02 NOTE — Progress Notes (Signed)
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 Inflammation/ recommend Levaquin  # Hepatitis C- follow up [facility]?   # Chronic pain- on oxycodone.      INTERVAL HISTORY:  This is my first interaction with the patient since I joined the practice September 2016. I reviewed the patient's prior charts/pertinent labs/imaging in detail; findings are summarized above.    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   Patient  Has chronic cough without hemoptysis.  No significant purpose sputum.  Has chronic shortness of breath which is not any worse. Appetite is fair. Denies any headaches vision changes or double vision.  Denies any nausea vomiting.   Patient has chronic pain-  Especially in the joints. He takes oxycodone for pain control.   REVIEW OF SYSTEMS:  A complete 10 point review of system is done which is negative except mentioned above/history of present illness.   PAST MEDICAL HISTORY :  Past Medical History  Diagnosis Date  . Lung cancer ([place])   . Arthritis   . Kidney stones   . Chicken pox     PAST SURGICAL HISTORY :   Past Surgical History  Procedure Laterality Date  . Orif femur fracture Right 2010  . Hand surgery Right 2010    ORIF of right index finder, 2nd carpometacarpal joint dislocation right hand, proximal right humerus  . Amputation of index finger  2010    FAMILY HISTORY :  No family history on file.  SOCIAL HISTORY:   Social  History  Substance Use Topics  . Smoking status: Current Every Day Smoker -- 2.00 packs/day for 20 years  . Smokeless tobacco: Not on file  . Alcohol Use: Yes    ALLERGIES:  has No Known Allergies.  MEDICATIONS:  Current Outpatient Prescriptions  Medication Sig Dispense Refill  . HYDROcodone-acetaminophen (NORCO/VICODIN) 5-325 MG tablet Take 1 tablet by mouth every 6 (six) hours as needed for moderate pain. 60 tablet 0  . Oxycodone HCl 10 MG TABS Take 1 once every 8-12 hours as needed for pain. 60 tablet 0  . levofloxacin (LEVAQUIN) 500 MG tablet Take 1 tablet (500 mg total) by mouth daily. 14 tablet 0  . promethazine (PHENERGAN) 25 MG tablet Take 25 mg by mouth every 6 (six) hours as needed for nausea or vomiting.     No current facility-administered medications for this visit.    PHYSICAL EXAMINATION: ECOG PERFORMANCE STATUS: 0 - Asymptomatic  BP 103/57 mmHg  Pulse 67  Temp(Src) 97.3 F (36.3 C) (Tympanic)  Resp 18  Ht '5\' 9"'$  (1.753 m)  Wt 115 lb 4.8 oz (52.3 kg)  BMI 17.02 kg/m2  Filed Weights   03/02/15 1118  Weight: 115 lb 4.8 oz (52.3 kg)    GENERAL:  Thin built/ moderately nourished; Alert, no distress and comfortable.    Accompanied by his wife. EYES: no pallor or icterus OROPHARYNX: no thrush or ulceration; poor dentition  NECK: supple, no masses felt LYMPH:  no palpable lymphadenopathy in  the cervical, axillary or inguinal regions LUNGS:  Decreased breath sounds bilaterally to auscultation. No wheeze or crackles HEART/CVS: regular rate & rhythm and no murmurs; No lower extremity edema ABDOMEN:abdomen soft, non-tender and normal bowel sounds Musculoskeletal:no cyanosis of digits and no clubbing  PSYCH: alert & oriented x 3 with fluent speech NEURO: no focal motor/sensory deficits SKIN:  no rashes or significant lesions  LABORATORY DATA:  I have reviewed the data as listed    Component Value Date/Time   NA 137 12/07/2014 0915   NA 136 07/27/2014 0856   K  4.5 12/07/2014 0915   K 3.9 07/27/2014 0856   CL 105 12/07/2014 0915   CL 104 07/27/2014 0856   CO2 28 12/07/2014 0915   CO2 27 07/27/2014 0856   GLUCOSE 100* 12/07/2014 0915   GLUCOSE 151* 07/27/2014 0856   BUN 17 12/07/2014 0915   BUN 10 07/27/2014 0856   CREATININE 1.01 03/02/2015 1107   CREATININE 0.86 07/27/2014 0856   CREATININE 0.77 07/06/2014   CALCIUM 8.2* 12/07/2014 0915   CALCIUM 8.6* 07/27/2014 0856   PROT 7.9 03/02/2015 1107   PROT 8.8* 06/28/2014 1512   ALBUMIN 3.5 03/02/2015 1107   ALBUMIN 4.0 06/28/2014 1512   AST 64* 03/02/2015 1107   AST 37 06/28/2014 1512   ALT 68* 03/02/2015 1107   ALT 43 06/28/2014 1512   ALKPHOS 45 03/02/2015 1107   ALKPHOS 53 06/28/2014 1512   BILITOT 0.7 03/02/2015 1107   BILITOT 0.3 06/28/2014 1512   GFRNONAA >60 03/02/2015 1107   GFRNONAA >60 07/27/2014 0856   GFRAA >60 03/02/2015 1107   GFRAA >60 07/27/2014 0856    No results found for: SPEP, UPEP  Lab Results  Component Value Date   WBC 4.8 03/02/2015   NEUTROABS 1.8 03/02/2015   HGB 12.3* 03/02/2015   HCT 37.7* 03/02/2015   MCV 88.8 03/02/2015   PLT 176 03/02/2015      Chemistry      Component Value Date/Time   NA 137 12/07/2014 0915   NA 136 07/27/2014 0856   K 4.5 12/07/2014 0915   K 3.9 07/27/2014 0856   CL 105 12/07/2014 0915   CL 104 07/27/2014 0856   CO2 28 12/07/2014 0915   CO2 27 07/27/2014 0856   BUN 17 12/07/2014 0915   BUN 10 07/27/2014 0856   CREATININE 1.01 03/02/2015 1107   CREATININE 0.86 07/27/2014 0856   CREATININE 0.77 07/06/2014      Component Value Date/Time   CALCIUM 8.2* 12/07/2014 0915   CALCIUM 8.6* 07/27/2014 0856   ALKPHOS 45 03/02/2015 1107   ALKPHOS 53 06/28/2014 1512   AST 64* 03/02/2015 1107   AST 37 06/28/2014 1512   ALT 68* 03/02/2015 1107   ALT 43 06/28/2014 1512   BILITOT 0.7 03/02/2015 1107   BILITOT 0.3 06/28/2014 1512       RADIOGRAPHIC STUDIES: I have personally reviewed the radiological images as listed  and agreed with the findings in the report. Ct Chest W Contrast  03/01/2015  CLINICAL DATA:  Lung cancer diagnosed 1 year ago. Chemotherapy and radiation therapy completed. Restaging. EXAM: CT CHEST WITH CONTRAST TECHNIQUE: Multidetector CT imaging of the chest was performed during intravenous contrast administration. CONTRAST:  52m OMNIPAQUE IOHEXOL 350 MG/ML SOLN COMPARISON:  CT 12/07/2014 and 08/10/2014.  PET-CT 06/02/2014. FINDINGS: Mediastinum/Nodes: There are no residual enlarged mediastinal, hilar or axillary lymph nodes. There is a paucity of mediastinal fat. Mild diffuse esophageal wall thickening is noted. The heart size is  normal. There is no pericardial effusion. Mild atherosclerosis of the aorta, great vessels and coronary arteries noted. Left subclavian Port-A-Cath tip in the lower SVC. Lungs/Pleura: There is no pleural effusion. Centrilobular emphysema and prominent paraseptal components at both apices are again noted. The dominant subpleural lesion peripherally in the right upper lobe demonstrates continued subjective improvement as visualized in all 3 planes, although measurements are difficult given its irregular shape. This measures approximately 2.3 cm maximally on axial image 13 (previously 2.5 cm). The nodular density along the superior aspect of the right major fissure appears slightly larger based on the sagittal images, measuring 1.7 x 0.8 cm on image 47 of series 6 (previously 1.5 x 0.6 cm). There are several new ill-defined, predominantly ground-glass nodules in the right lung, including a 5 mm right upper lobe lesion on image 17 and 2 6 mm lesions in the right lower lobe on image Mario. There are additional ill-defined lesions in the right upper lobe on image 20, the right lower lobe on image 24 and a left upper lobe on image Mario and 25. Upper abdomen:  Stable appearance without suspicious findings. Musculoskeletal/Chest wall: There is no chest wall mass or suspicious osseous finding.  IMPRESSION: 1. The dominant hypermetabolic right upper lobe lesion demonstrates continued slight improvement. 2. The subpleural nodule along the superior aspect of the right major fissure has slightly enlarged, and there are several new ill-defined ground-glass densities in both lungs. Most of these are not typical of metastatic disease and may be inflammatory, although are indeterminate. Continued follow up recommended. 3. No evidence of adenopathy. Electronically Signed   By: Richardean Sale M.D.   On: 03/01/2015 08:30     ASSESSMENT & PLAN:   # ADENO CA LUNG CA RUL-STAGE III  Status post chemoradiation  [facility]  Summer 2016.  CT scan-  03/01/2015-  Stable right upper lobe lesion;  However  subcentimeter  Bilateral groundglass opacity noted [ see discussion below].  Plan to get a CT scan in 3 months.   # Cough-  Question  Related to   Bilateral groundglass opacities as discussed above.  I recommend a trial of  Levaquin for 14 days;  Get a chest x-ray in 4 weeks.  Follow-up with me in 6 weeks.   # Active smoker-  Patient seems not interested in quitting smoking.  # port flush/emla cream-  Discussed that this needs to be  Flush every 6-8 weeks.   #  Chronic pain-  Prescription for oxycodone given.  #  CBC within normal limits creatinine normal;  LFTs slightly elevated AST ALT [ grade 1]-  Likely from hepatitis C.   #  25 minutes face-to-face with the patient and family;  More than 50% of time spent on counseling and coordination. I reviewed the images independently/ Summary as above.  Orders Placed This Encounter  Procedures  . CT Chest W Contrast    Standing Status: Future     Number of Occurrences:      Standing Expiration Date: 05/01/2016    Order Specific Question:  Reason for Exam (SYMPTOM  OR DIAGNOSIS REQUIRED)    Answer:  lung cancer/ ground glass opcaities    Order Specific Question:  Preferred imaging location?    Answer:  [facility]  . DG Chest 2 View    Standing  Status: Future     Number of Occurrences:      Standing Expiration Date: 03/01/2016    Order Specific Question:  Reason for Exam (SYMPTOM  OR DIAGNOSIS REQUIRED)    Answer:  followup pneumonia    Order Specific Question:  Preferred imaging location?    Answer:  [facility]   All questions were answered. The patient knows to call the clinic with any problems, questions or concerns. No barriers to learning was detected.      Cammie Sickle, MD 03/02/2015 11:57 AM

## 2015-03-02 NOTE — Progress Notes (Signed)
Patient is here for follow-up of lung cancer and CT Scan results. Patient states that he was fishing last month and fell and broke his hand. He went to the ER and they put a cast on it. Patient's appetite has been good.

## 2015-03-22 ENCOUNTER — Other Ambulatory Visit: Payer: Self-pay | Admitting: *Deleted

## 2015-03-22 ENCOUNTER — Other Ambulatory Visit: Payer: Self-pay | Admitting: Family Medicine

## 2015-03-22 ENCOUNTER — Telehealth: Payer: Self-pay | Admitting: *Deleted

## 2015-03-22 DIAGNOSIS — C3412 Malignant neoplasm of upper lobe, left bronchus or lung: Secondary | ICD-10-CM

## 2015-03-22 DIAGNOSIS — C3411 Malignant neoplasm of upper lobe, right bronchus or lung: Secondary | ICD-10-CM

## 2015-03-22 DIAGNOSIS — J189 Pneumonia, unspecified organism: Secondary | ICD-10-CM

## 2015-03-22 DIAGNOSIS — M545 Low back pain: Secondary | ICD-10-CM

## 2015-03-22 MED ORDER — OXYCODONE HCL 10 MG PO TABS: ORAL_TABLET | ORAL | Status: DC

## 2015-03-22 NOTE — Telephone Encounter (Signed)
Pt walked in to cancer center and requesting pain meds.  He states he was doing something to his wife's car and twisted and hurt his lower back in the lumbar-sacral area and mid line.  He states that walking hurts him.  That is why he needs refill of pain med.  I called Dr. B and he states that pt can have lumbar-sacral plain xray and if we could get one of the other doctors to give pain med.  I informed pt that  Dr. B said to get xray today of his back, no appt needed and he can go to medical mall and stop at registration desk first to get account in computer and then he will get xray. rx given to patient and he said he will get xray

## 2015-03-29 ENCOUNTER — Telehealth: Payer: Self-pay | Admitting: *Deleted

## 2015-03-29 NOTE — Telephone Encounter (Signed)
Contacted patient to inquire why patient did not go for his lumbar spine xray. The patient states that "I went to the wrong place. I went to the ER and thought that was the place to get my x-ray and they told me to go to walk all the way to the medical mall so I left. I'll get it on Friday."  I reminded the patient to go to either the radiology dept in the medical mall or to go the radiology dept at the [facility] location.

## 2015-04-01 ENCOUNTER — Ambulatory Visit
Admission: RE | Admit: 2015-04-01 | Discharge: 2015-04-01 | Disposition: A | Discharge status: Home / Self Care | Payer: No Typology Code available for payment source | Source: Ambulatory Visit | Attending: Internal Medicine | Admitting: Internal Medicine

## 2015-04-01 DIAGNOSIS — M545 Low back pain: Secondary | ICD-10-CM

## 2015-04-01 DIAGNOSIS — M5136 Other intervertebral disc degeneration, lumbar region: Secondary | ICD-10-CM | POA: Diagnosis not present

## 2015-04-06 ENCOUNTER — Encounter: Payer: Self-pay | Admitting: Radiation Oncology

## 2015-04-06 ENCOUNTER — Ambulatory Visit
Admission: RE | Admit: 2015-04-06 | Discharge: 2015-04-06 | Disposition: A | Discharge status: Home / Self Care | Payer: No Typology Code available for payment source | Source: Ambulatory Visit | Attending: Radiation Oncology | Admitting: Radiation Oncology

## 2015-04-06 VITALS — BP 117/68 | HR 73 | Temp 96.7°F | Resp 18 | Ht 69.0 in | Wt 115.4 lb

## 2015-04-06 DIAGNOSIS — C3411 Malignant neoplasm of upper lobe, right bronchus or lung: Secondary | ICD-10-CM

## 2015-04-06 NOTE — Progress Notes (Signed)
Radiation Oncology Follow up Note  Name: Mario [name]   Date:   04/06/2015 MRN:  [id number] DOB: 1961/03/03    This 55 y.o. male presents to the clinic today for follow-up for stage III adenocarcinoma the right lung.  REFERRING PROVIDER: Tracie Harrier, MD  HPI: Patient is a 55 year old male now seen out 6 months having completed radiation therapy with concurrent cis-platinum chemotherapy for stage III adenocarcinoma of the right upper lobe. He is seen today in routine follow-up. He is doing well he states he has no symptoms except for some mild dyspnea on exertion and shortness of breath. No cough hemoptysis or chest tightness. He had a CT scan in November 2016 showing stable right upper lobe lesion however some small groundglass opacities were noted and he has a repeat CT scan scheduled in February. The groundglass opacities may represent inflammation rather than malignancy according to radiology report.  COMPLICATIONS OF TREATMENT: none  FOLLOW UP COMPLIANCE: keeps appointments   PHYSICAL EXAM:  BP 117/68 mmHg  Pulse 73  Temp(Src) 96.7 F (35.9 C)  Resp 18  Ht '5\' 9"'$  (1.753 m)  Wt 115 lb 6.6 oz (52.35 kg)  BMI 17.04 kg/m2 Well-developed well-nourished patient in NAD. HEENT reveals PERLA, EOMI, discs not visualized.  Oral cavity is clear. No oral mucosal lesions are identified. Neck is clear without evidence of cervical or supraclavicular adenopathy. Lungs are clear to A&P. Cardiac examination is essentially unremarkable with regular rate and rhythm without murmur rub or thrill. Abdomen is benign with no organomegaly or masses noted. Motor sensory and DTR levels are equal and symmetric in the upper and lower extremities. Cranial nerves II through XII are grossly intact. Proprioception is intact. No peripheral adenopathy or edema is identified. No motor or sensory levels are noted. Crude visual fields are within normal range.  RADIOLOGY RESULTS: Serial CT scans are  reviewed  PLAN: At the present time he is doing well has had a good response to therapy and clinically he is asymptomatic. I have asked to see him back in 6 months for follow-up. He is already scheduled a follow-up CT scan in February and will be following up with medical oncology on that. Patient knows to call sooner with any concerns.  I would like to take this opportunity for allowing me to participate in the care of your patient.Armstead Peaks., MD

## 2015-04-13 ENCOUNTER — Inpatient Hospital Stay: Payer: No Typology Code available for payment source | Admitting: Internal Medicine

## 2015-04-13 ENCOUNTER — Inpatient Hospital Stay: Payer: No Typology Code available for payment source

## 2015-04-19 ENCOUNTER — Other Ambulatory Visit: Payer: Self-pay | Admitting: Internal Medicine

## 2015-04-19 ENCOUNTER — Telehealth: Payer: Self-pay | Admitting: Internal Medicine

## 2015-04-19 DIAGNOSIS — C3411 Malignant neoplasm of upper lobe, right bronchus or lung: Secondary | ICD-10-CM

## 2015-04-19 NOTE — Telephone Encounter (Signed)
Patient lvm stating he would like a call back from Dr. Rogue Bussing or his nurse. He did not provide details for reason. Thanks.

## 2015-04-19 NOTE — Telephone Encounter (Signed)
Spoke with patient. Pt states, "My neck in my spine hurts. It's been hurting for over a month now. It must be where I had not had my port flush. I'm using my oxycodone 10 mg daily to help with pain. My neck hurts when I bend over or turn my head. I do heavy lifting a lot in my daily activities and job." pt states, "I'm using icy hot ointment on my neck and this does not help me that much. I only get relief when I take my narcotics."  I explained to patient that his port flush is scheduled for next week. His pain is not associated with not having his port flush as he is not due for his port flush until next week. I explained that him that this last lumbar xray showed mild degenerative changes of lumbar spine. He asked It is possible that he may also have arthritis in his spine. I explained that I would review his chart with Dr. Rogue Bussing and contact him back. Pt states that he will need a RF on the oxycodone 10 mg. "I only have 3 tablets left."  Dr. Rogue Bussing reveiwed chart. MD would like to order a Bone scan and staging ct scan before next md appointment. MD will give patient a weeks' worth of narcotic. Md will review results at pt's next apt and will determine if further narcotics are warranted or if a referral to the pain clinic is necessary.   I contacted the patient. He understands that he will be scheduled for a bone scan and ct scans. He will only given 1 weeks supply for the narcotic. I explained that MD must evaluate patient's need for the narcotics with scans. He gave verbal understanding. He would like his scans to be set up at the [address] location. He can not get the scans performed this week. Pt states that he prefers that the scans be set up for next week. Teach back process performed.

## 2015-04-20 ENCOUNTER — Other Ambulatory Visit: Payer: Self-pay | Admitting: Internal Medicine

## 2015-04-20 DIAGNOSIS — C3412 Malignant neoplasm of upper lobe, left bronchus or lung: Secondary | ICD-10-CM

## 2015-04-20 DIAGNOSIS — J189 Pneumonia, unspecified organism: Secondary | ICD-10-CM

## 2015-04-20 DIAGNOSIS — C3411 Malignant neoplasm of upper lobe, right bronchus or lung: Secondary | ICD-10-CM

## 2015-04-20 MED ORDER — OXYCODONE HCL 10 MG PO TABS: ORAL_TABLET | ORAL | Status: DC

## 2015-04-22 ENCOUNTER — Encounter: Payer: Self-pay | Admitting: *Deleted

## 2015-04-22 NOTE — Progress Notes (Signed)
Per cancer center scheduling, Multiple attempts have been made to contact the patient regarding his upcoming scan appointments. He has no voice mail set up on his phone. He still has not picked up his narotics which were prescribed on 04/20/15. At this time he is unaware of his ct scan appointments.

## 2015-04-27 ENCOUNTER — Ambulatory Visit
Admission: RE | Admit: 2015-04-27 | Discharge: 2015-04-27 | Disposition: A | Discharge status: Home / Self Care | Payer: BLUE CROSS/BLUE SHIELD | Source: Ambulatory Visit | Attending: Internal Medicine | Admitting: Internal Medicine

## 2015-04-27 ENCOUNTER — Encounter
Admission: RE | Admit: 2015-04-27 | Discharge: 2015-04-27 | Disposition: A | Discharge status: Home / Self Care | Payer: BLUE CROSS/BLUE SHIELD | Source: Ambulatory Visit | Attending: Internal Medicine | Admitting: Internal Medicine

## 2015-04-27 DIAGNOSIS — R935 Abnormal findings on diagnostic imaging of other abdominal regions, including retroperitoneum: Secondary | ICD-10-CM | POA: Insufficient documentation

## 2015-04-27 DIAGNOSIS — C3412 Malignant neoplasm of upper lobe, left bronchus or lung: Secondary | ICD-10-CM

## 2015-04-27 DIAGNOSIS — C3411 Malignant neoplasm of upper lobe, right bronchus or lung: Secondary | ICD-10-CM | POA: Diagnosis not present

## 2015-04-27 DIAGNOSIS — J189 Pneumonia, unspecified organism: Secondary | ICD-10-CM

## 2015-04-27 LAB — POCT I-STAT CREATININE: Creatinine, Ser: 1 mg/dL (ref 0.61–1.24)

## 2015-04-27 MED ORDER — TECHNETIUM TC 99M MEDRONATE IV KIT
22.1850 | PACK | Freq: Once | INTRAVENOUS | Status: AC | PRN
  Administered 2015-04-27: 22.185 via INTRAVENOUS

## 2015-04-27 MED ORDER — IOHEXOL 300 MG/ML  SOLN
75.0000 mL | Freq: Once | INTRAMUSCULAR | Status: AC | PRN
  Administered 2015-04-27: 75 mL via INTRAVENOUS

## 2015-04-28 ENCOUNTER — Inpatient Hospital Stay: Payer: BLUE CROSS/BLUE SHIELD | Attending: Internal Medicine

## 2015-04-28 ENCOUNTER — Encounter: Payer: Self-pay | Admitting: Internal Medicine

## 2015-04-28 ENCOUNTER — Inpatient Hospital Stay (HOSPITAL_BASED_OUTPATIENT_CLINIC_OR_DEPARTMENT_OTHER): Payer: BLUE CROSS/BLUE SHIELD | Admitting: Internal Medicine

## 2015-04-28 VITALS — BP 122/69 | HR 62 | Temp 97.5°F | Ht 69.0 in | Wt 116.4 lb

## 2015-04-28 DIAGNOSIS — C3411 Malignant neoplasm of upper lobe, right bronchus or lung: Secondary | ICD-10-CM

## 2015-04-28 DIAGNOSIS — B1920 Unspecified viral hepatitis C without hepatic coma: Secondary | ICD-10-CM | POA: Diagnosis not present

## 2015-04-28 DIAGNOSIS — C7801 Secondary malignant neoplasm of right lung: Secondary | ICD-10-CM

## 2015-04-28 DIAGNOSIS — Z8781 Personal history of (healed) traumatic fracture: Secondary | ICD-10-CM | POA: Diagnosis not present

## 2015-04-28 DIAGNOSIS — J432 Centrilobular emphysema: Secondary | ICD-10-CM

## 2015-04-28 DIAGNOSIS — C801 Malignant (primary) neoplasm, unspecified: Secondary | ICD-10-CM

## 2015-04-28 DIAGNOSIS — Z923 Personal history of irradiation: Secondary | ICD-10-CM

## 2015-04-28 DIAGNOSIS — F17210 Nicotine dependence, cigarettes, uncomplicated: Secondary | ICD-10-CM | POA: Diagnosis not present

## 2015-04-28 DIAGNOSIS — Z452 Encounter for adjustment and management of vascular access device: Secondary | ICD-10-CM

## 2015-04-28 DIAGNOSIS — G893 Neoplasm related pain (acute) (chronic): Secondary | ICD-10-CM | POA: Diagnosis not present

## 2015-04-28 DIAGNOSIS — Z87442 Personal history of urinary calculi: Secondary | ICD-10-CM | POA: Diagnosis not present

## 2015-04-28 DIAGNOSIS — G8929 Other chronic pain: Secondary | ICD-10-CM | POA: Diagnosis not present

## 2015-04-28 DIAGNOSIS — C3412 Malignant neoplasm of upper lobe, left bronchus or lung: Secondary | ICD-10-CM

## 2015-04-28 DIAGNOSIS — Z9221 Personal history of antineoplastic chemotherapy: Secondary | ICD-10-CM

## 2015-04-28 DIAGNOSIS — M129 Arthropathy, unspecified: Secondary | ICD-10-CM | POA: Insufficient documentation

## 2015-04-28 DIAGNOSIS — J189 Pneumonia, unspecified organism: Secondary | ICD-10-CM

## 2015-04-28 MED ORDER — OXYCODONE HCL 10 MG PO TABS: ORAL_TABLET | ORAL | Status: DC

## 2015-04-28 MED ORDER — HEPARIN SOD (PORK) LOCK FLUSH 100 UNIT/ML IV SOLN
500.0000 [IU] | Freq: Once | INTRAVENOUS | Status: AC
  Administered 2015-04-28: 500 [IU] via INTRAVENOUS

## 2015-04-28 MED ORDER — SODIUM CHLORIDE 0.9% FLUSH
10.0000 mL | INTRAVENOUS | Status: DC | PRN
Start: 2015-04-28 — End: 2015-04-28
  Administered 2015-04-28: 10 mL via INTRAVENOUS
  Filled 2015-04-28: qty 10

## 2015-04-28 MED ORDER — HEPARIN SOD (PORK) LOCK FLUSH 100 UNIT/ML IV SOLN
INTRAVENOUS | Status: AC
  Filled 2015-04-28: qty 5

## 2015-04-28 NOTE — Progress Notes (Signed)
Pt provided information for Tecentriq. Approximately 5-10 minutes spent educating patient on new drug. Teach back process performed.

## 2015-04-28 NOTE — Progress Notes (Signed)
[facility] OFFICE PROGRESS NOTE  Patient Care Team: Tracie Harrier, MD as PCP - General (Internal Medicine)   SUMMARY OF ONCOLOGIC HISTORY:  # MARCH 2016- LUNG CA- RUL- ADENO CA [mol testing-NA sec to poor samples] STAGE III s/p chemo-RT; [SEP 2016-CT Bx for mol testing; neg for malignancy]; CT-29th  NOV 2016-  Stable/ improved right upper lobe lesion;   # CT JAN 2016- stable right upper lobe lung nodule ~ 2.5cm; but progressive RML ~1.8cm; Start Tecentriq q 3W  # Hepatitis C- follow up [facility]?   # Chronic pain- on oxycodone.     INTERVAL HISTORY:  55 year old male patient with above history of  Adenocarcinoma the lung stage III status post chemoradiation finished 2016 September- is here for follow-up/to review the results of his restaging CAT scan.  Patient continues to complain of chronic pain- neck; right chest wall; back "all over the body". He has chronic cough without hemoptysis.  No significant  sputum.  Has chronic shortness of breath which is not any worse. Appetite is fair. Denies any headaches vision changes or double vision.  Denies any nausea vomiting.  REVIEW OF SYSTEMS:  A complete 10 point review of system is done which is negative except mentioned above/history of present illness.   PAST MEDICAL HISTORY :  Past Medical History  Diagnosis Date  . Lung cancer ([place])   . Arthritis   . Kidney stones   . Chicken pox     PAST SURGICAL HISTORY :   Past Surgical History  Procedure Laterality Date  . Orif femur fracture Right 2010  . Hand surgery Right 2010    ORIF of right index finder, 2nd carpometacarpal joint dislocation right hand, proximal right humerus  . Amputation of index finger  2010    FAMILY HISTORY :  No family history on file.  SOCIAL HISTORY:   Social History  Substance Use Topics  . Smoking status: Current Every Day Smoker -- 2.00 packs/day for 20 years  . Smokeless tobacco: Not on file  . Alcohol Use: 0.0 oz/week    0  Standard drinks or equivalent per week    ALLERGIES:  has No Known Allergies.  MEDICATIONS:  Current Outpatient Prescriptions  Medication Sig Dispense Refill  . Oxycodone HCl 10 MG TABS Take 1 once every 8-12 hours as needed for pain. 30 tablet 0   No current facility-administered medications for this visit.    PHYSICAL EXAMINATION: ECOG PERFORMANCE STATUS: 0 - Asymptomatic  BP 122/69 mmHg  Pulse 62  Temp(Src) 97.5 F (36.4 C) (Tympanic)  Ht '5\' 9"'$  (1.753 m)  Wt 116 lb 6.5 oz (52.8 kg)  BMI 17.18 kg/m2  Filed Weights   04/28/15 0948 04/28/15 0949  Weight: 116 lb 6.5 oz (52.8 kg) 116 lb 6.5 oz (52.8 kg)    GENERAL:  Thin built/ moderately nourished; Alert, no distress and comfortable. Alone.  EYES: no pallor or icterus OROPHARYNX: no thrush or ulceration; poor dentition  NECK: supple, no masses felt LYMPH:  no palpable lymphadenopathy in the cervical, axillary or inguinal regions LUNGS:  Decreased breath sounds bilaterally to auscultation. No wheeze or crackles HEART/CVS: regular rate & rhythm and no murmurs; No lower extremity edema ABDOMEN:abdomen soft, non-tender and normal bowel sounds Musculoskeletal:no cyanosis of digits and no clubbing  PSYCH: alert & oriented x 3 with fluent speech NEURO: no focal motor/sensory deficits SKIN:  no rashes or significant lesions  LABORATORY DATA:  I have reviewed the data as listed  Component Value Date/Time   NA 137 12/07/2014 0915   NA 136 07/27/2014 0856   K 4.5 12/07/2014 0915   K 3.9 07/27/2014 0856   CL 105 12/07/2014 0915   CL 104 07/27/2014 0856   CO2 28 12/07/2014 0915   CO2 27 07/27/2014 0856   GLUCOSE 100* 12/07/2014 0915   GLUCOSE 151* 07/27/2014 0856   BUN 17 12/07/2014 0915   BUN 10 07/27/2014 0856   CREATININE 1.00 04/27/2015 0930   CREATININE 0.86 07/27/2014 0856   CREATININE 0.77 07/06/2014   CALCIUM 8.2* 12/07/2014 0915   CALCIUM 8.6* 07/27/2014 0856   PROT 7.9 03/02/2015 1107   PROT 8.8*  06/28/2014 1512   ALBUMIN 3.5 03/02/2015 1107   ALBUMIN 4.0 06/28/2014 1512   AST 64* 03/02/2015 1107   AST 37 06/28/2014 1512   ALT 68* 03/02/2015 1107   ALT 43 06/28/2014 1512   ALKPHOS 45 03/02/2015 1107   ALKPHOS 53 06/28/2014 1512   BILITOT 0.7 03/02/2015 1107   BILITOT 0.3 06/28/2014 1512   GFRNONAA >60 03/02/2015 1107   GFRNONAA >60 07/27/2014 0856   GFRAA >60 03/02/2015 1107   GFRAA >60 07/27/2014 0856    No results found for: SPEP, UPEP  Lab Results  Component Value Date   WBC 4.8 03/02/2015   NEUTROABS 1.8 03/02/2015   HGB 12.3* 03/02/2015   HCT 37.7* 03/02/2015   MCV 88.8 03/02/2015   PLT 176 03/02/2015      Chemistry      Component Value Date/Time   NA 137 12/07/2014 0915   NA 136 07/27/2014 0856   K 4.5 12/07/2014 0915   K 3.9 07/27/2014 0856   CL 105 12/07/2014 0915   CL 104 07/27/2014 0856   CO2 28 12/07/2014 0915   CO2 27 07/27/2014 0856   BUN 17 12/07/2014 0915   BUN 10 07/27/2014 0856   CREATININE 1.00 04/27/2015 0930   CREATININE 0.86 07/27/2014 0856   CREATININE 0.77 07/06/2014      Component Value Date/Time   CALCIUM 8.2* 12/07/2014 0915   CALCIUM 8.6* 07/27/2014 0856   ALKPHOS 45 03/02/2015 1107   ALKPHOS 53 06/28/2014 1512   AST 64* 03/02/2015 1107   AST 37 06/28/2014 1512   ALT 68* 03/02/2015 1107   ALT 43 06/28/2014 1512   BILITOT 0.7 03/02/2015 1107   BILITOT 0.3 06/28/2014 1512       RADIOGRAPHIC STUDIES: I have personally reviewed the radiological images as listed and agreed with the findings in the report. Ct Chest W Contrast  04/27/2015  CLINICAL DATA:  Diagnosis with right lung cancer 1 year ago post chemotherapy and radiation therapy. Recent upper respiratory infection. EXAM: CT CHEST WITH CONTRAST TECHNIQUE: Multidetector CT imaging of the chest was performed during intravenous contrast administration. CONTRAST:  57m OMNIPAQUE IOHEXOL 300 MG/ML  SOLN COMPARISON:  CTs ranging from 05/18/2014 through 03/01/2015. PET-CT  06/02/2014. FINDINGS: Mediastinum/Nodes: There are no enlarged mediastinal, hilar or axillary lymph nodes.As before, there is a paucity of mediastinal fat with diffuse esophageal wall thickening, likely related to previous therapy. The heart size is normal. There is no pericardial effusion. Left subclavian Port-A-Cath tip extends the mid SVC. There is atherosclerosis of the aorta, great vessels and coronary arteries. Lungs/Pleura: There is no pleural effusion. Centrilobular emphysema with prominent paraseptal components at both lung apices again noted. There is volume loss in the right upper lobe. The dominant subpleural right upper lobe lesion remains irregular in shape and difficult to measure. Subjectively, this appears  slightly larger than on the most recent study, measuring up to 2.6 cm on axial image 16 of series 3. Appearance is similar to the 12/07/2014 study. The irregular nodule along the superior aspect of the right major fissure demonstrates progressive slow growth, measuring up to 18 x 13 mm on axial image 24 of series 3. The small ground-glass nodules noted medially in the right lower lobe on the most recent study have not changed (image 25). No other new or enlarging nodules identified. There are no suspicious left lung findings. Upper abdomen: Stable appearance. There are prominent lymph nodes within the porta hepatis which are chronic and not hypermetabolic on previous PET-CT. Musculoskeletal/Chest wall: There is no chest wall mass or suspicious osseous finding. IMPRESSION: 1. The dominant subpleural right upper lobe lesion has not significantly changed over the last 2 studies, consistent with partially treated lung cancer. 2. The other hypermetabolic lesion along the superior aspect of the right major fissure demonstrates continued slow growth, concerning for metastasis or second primary lesion. 3. Other ill-defined ground-glass nodules in the right lung are unchanged. No mediastinal adenopathy or  other evidence of progressive metastatic disease. Electronically Signed   By: Richardean Sale M.D.   On: 04/27/2015 10:22   Nm Bone Scan Whole Body  04/27/2015  CLINICAL DATA:  Right upper lobe lung malignancy ; patient reports neck and low back pain, recent trauma to the back, previous episode of surgery on ribs and collarbone EXAM: NUCLEAR MEDICINE WHOLE BODY BONE SCAN TECHNIQUE: Whole body anterior and posterior images were obtained approximately 3 hours after intravenous injection of radiopharmaceutical. RADIOPHARMACEUTICALS:  22.185 mCi Technetium-32mMDP IV COMPARISON:  None in PACs FINDINGS: There is adequate uptake of the radiopharmaceutical by the skeleton. There is adequate soft tissue clearance and renal activity. There is retention of significant activity in the right kidney which may indicate a relative UPJ obstruction. Dilated ureters are not observed. Uptake of the radiopharmaceutical within the calvarium is normal. There is small focus of increased uptake posteriorly on the right at the base of the neck. Activity within the cervical and thoracic and lumbar spines is normal. No abnormal rib activity is observed. Activity associated with the shoulders and right wrist is consistent with degenerative change. Uptake within the pelvis is increased in the parasymphyseal regions. Uptake within the lower extremities is within the limits of normal. IMPRESSION: 1. No objective evidence of metastatic disease. Increased uptake in the base of the neck on the right posteriorly is likely degenerative in nature. 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 2. Retention of activity in the right kidney is consistent with a ureteropelvic junction obstructive process. Electronically Signed   By: David  JMartiniqueM.D.   On: 04/27/2015 13:16     ASSESSMENT & PLAN:   # ADENO CA LUNG CA RUL-STAGE III  [facility]  [facility]  January 2017-  shows stable right upper lobe lesion; progressive right middle lobe lesion 1.8 cm. I reviewed the images myself.   # patient is interested in next line Of therapy [pt previously received ] given the progression of the disease- I would recommend Tecentriq every 3 weeks. The goal of therapy is palliative; and length of treatments are likely ongoing/based upon the results of the scans. Discussed the potential side effects of immunotherapy including but not limited to diarrhea; skin rash; elevated LFTs/endocrine abnormalities etc.    # I also reviewed the scans with Dr. CDonella Stade who  feels that if the right middle lobe lesion is not improving; then radiation could be repeated.  # Active smoker-  Patient seems not interested in quitting smoking.  #  Chronic pain- ? Related to malignancy.  Prescription for oxycodone given.  # check CBC CMP; TSH today. Patient will follow-up with me in approximately 4 weeks/prior to next treatment with CBC CMP  # based on bone scan- question obstruction at the ureteropelvic region; await creatinine from today. We will recommend ultrasound of the kidneys at next visit.  #  40  minutes face-to-face with the patient with more than 50% of time spent on counseling and coordination. I reviewed the images independently/ Summary as above.       Cammie Sickle, MD 04/28/2015 9:51 AM

## 2015-04-28 NOTE — Patient Instructions (Signed)
MEDICATION GUIDE TECENTRIQ (te-SEN-trik) (atezolizumab) injection What is the most important information I should know about TECENTRIQ? TECENTRIQ is a medicine that may treat your bladder cancer or lung cancer by working with your immune system. TECENTRIQ can cause your immune system to attack normal organs and tissues in many areas of your body and can affect the way they work. These problems can sometimes become serious or life-threatening and can lead to death. Call or see your healthcare provider right away if you get any symptoms of the following problems or these symptoms get worse: Lung problems (pneumonitis). Signs and symptoms of pneumonitis may include: . new or worsening cough . shortness of breath . chest pain Liver problems (hepatitis). Signs and symptoms of hepatitis may include: . yellowing of your skin or the whites of your eyes . severe nausea or vomiting . pain on the right side of your stomach area (abdomen) . drowsiness . dark urine (tea colored) . bleeding or bruising more easily than normal . feeling less hungry than usual Intestinal problems (colitis). Signs and symptoms of colitis may include: . diarrhea (loose stools) or more bowel movements than usual . blood in your stools or dark, tarry, sticky stools . severe stomach area (abdomen) pain or tenderness Hormone gland problems (especially the pituitary, thyroid, adrenal glands, and pancreas). Signs and symptoms that your hormone glands are not working properly may include: . headaches that will not go away or unusual headaches . extreme tiredness . weight gain or weight loss . dizziness or fainting . feeling more hungry or thirsty than usual . hair loss . feeling cold . constipation . your voice gets deeper . urinating more often than usual . nausea or vomiting . stomach area (abdomen) pain . changes in mood or behavior, such as decreased sex drive, irritability, or forgetfulness Nervous system  problems (neuropathy, meningitis, encephalitis). Signs and symptoms of nervous system problems may include: . severe muscle weakness . numbness or tingling in hands or feet . fever . confusion . changes in mood or behavior . extreme sensitivity to light . neck stiffness Inflammation of the eyes. Signs and symptoms may include: . blurry vision, double vision, or other vision problems . eye pain or redness Severe infections. Signs and symptoms of infection may include: . fever . cough . frequent urination . flu-like symptoms . pain when urinating Severe infusion reactions. Signs and symptoms of infusion reactions may include: . chills or shaking . itching or rash . flushing . shortness of breath or wheezing . swelling of your face or lips . dizziness . fever . feeling like passing out . back or neck pain Getting medical treatment right away may help keep these problems from becoming more serious. Your healthcare provider will check you for these problems during your treatment with TECENTRIQ. Your healthcare provider may treat you with corticosteroid or hormone replacement medicines. Your healthcare provider may delay or completely stop treatment with TECENTRIQ if you have severe side effects. What is TECENTRIQ? TECENTRIQ is a prescription medicine used to treat: . a type of bladder cancer called urothelial carcinoma TECENTRIQ may be used when your bladder cancer: o has spread or cannot be removed by surgery (advanced urothelial carcinoma), and o you have tried chemotherapy that contains platinum, and it did not work or is no longer working. Marland Kitchen a type of lung cancer called non-small cell lung cancer (NSCLC) TECENTRIQ may be used when your lung cancer: o has spread or grown, and o you have tried chemotherapy that contains  platinum, and it did not work or is no longer working. If your tumor has an abnormal EGFR or ALK gene, you should have also tried an FDA-approved therapy for  tumors with these abnormal genes, and it did not work or is no longer working. It is not known if TECENTRIQ is safe and effective in children. Before you receive TECENTRIQ, tell your healthcare provider about all of your medical conditions, including if you: . have immune system problems such as Crohn's disease, ulcerative colitis, or lupus . have had an organ transplant . have lung or breathing problems . have liver problems . have a condition that affects your nervous system, such as myasthenia gravis or Guillain-Barr syndrome . are being treated for an infection . are pregnant or plan to become pregnant. TECENTRIQ can harm your unborn baby. If you are able to become pregnant, you should use an effective method of birth control during your treatment and for at least 5 months after the last dose of TECENTRIQ. Marland Kitchen are breastfeeding or plan to breastfeed. It is not known if TECENTRIQ passes into your breastmilk. Do not breastfeed during treatment and for at least 5 months after the last dose of TECENTRIQ. Tell your healthcare provider about all the medicines you take, including prescription and over-the-counter medicines, vitamins, and herbal supplements. How will I receive TECENTRIQ? . Your healthcare provider will give you TECENTRIQ into your vein through an intravenous (IV) line over 30 to 60 minutes. Gildardo Pounds is usually given every 3 weeks. . Your healthcare provider will decide how many treatments you need. . Your healthcare provider will test your blood to check you for certain side effects. If you miss any appointments, call your healthcare provider as soon as possible to reschedule your appointment. What are the possible side effects of TECENTRIQ? TECENTRIQ can cause serious side effects, including: . See "What is the most important information I should know about TECENTRIQ?" The most common side effects of TECENTRIQ in people with urothelial carcinoma include: . feeling tired .  decreased appetite . nausea . urinary tract infection . fever . constipation The most common side effects of TECENTRIQ in people with non-small cell lung cancer include: . feeling tired . decreased appetite . shortness of breath . cough . nausea . constipation TECENTRIQ may cause fertility problems in females, which may affect the ability to have children. Talk to your healthcare provider if you have concerns about fertility. These are not all the possible side effects of TECENTRIQ. Ask your healthcare provider or pharmacist for more information. Call your doctor for medical advice about side effects. You may report side effects to FDA at 1-800-FDA-1088. General information about the safe and effective use of TECENTRIQ. Medicines are sometimes prescribed for purposes other than those listed in a Medication Guide. If you would like more information about TECENTRIQ, talk with your healthcare provider. You can ask your healthcare provider for information about TECENTRIQ that is written for health professionals. What are the ingredients in [place]? Active ingredient: atezolizumab Inactive ingredients: glacial acetic acid, L-histidine, sucrose, polysorbate 20 Manufactured by: [employer] A Member of the [employer], [address], [place] [address] [place] License No. [id number] TECENTRIQ is a Data processing manager, [employer]. For more information, call (908)512-7779 or go to www.[employer].uy. This Medication Guide has been approved by the U.S. Food and Drug Administration. Revised: 01/2015

## 2015-05-05 ENCOUNTER — Inpatient Hospital Stay: Payer: BLUE CROSS/BLUE SHIELD

## 2015-05-12 ENCOUNTER — Inpatient Hospital Stay: Payer: BLUE CROSS/BLUE SHIELD

## 2015-05-12 ENCOUNTER — Inpatient Hospital Stay: Payer: BLUE CROSS/BLUE SHIELD | Attending: Internal Medicine

## 2015-05-12 VITALS — BP 103/67 | HR 63 | Temp 97.4°F

## 2015-05-12 DIAGNOSIS — B1920 Unspecified viral hepatitis C without hepatic coma: Secondary | ICD-10-CM | POA: Diagnosis not present

## 2015-05-12 DIAGNOSIS — M129 Arthropathy, unspecified: Secondary | ICD-10-CM | POA: Insufficient documentation

## 2015-05-12 DIAGNOSIS — G8929 Other chronic pain: Secondary | ICD-10-CM | POA: Diagnosis not present

## 2015-05-12 DIAGNOSIS — Z79899 Other long term (current) drug therapy: Secondary | ICD-10-CM | POA: Insufficient documentation

## 2015-05-12 DIAGNOSIS — R0602 Shortness of breath: Secondary | ICD-10-CM | POA: Diagnosis not present

## 2015-05-12 DIAGNOSIS — C3411 Malignant neoplasm of upper lobe, right bronchus or lung: Secondary | ICD-10-CM | POA: Insufficient documentation

## 2015-05-12 DIAGNOSIS — Z8781 Personal history of (healed) traumatic fracture: Secondary | ICD-10-CM | POA: Diagnosis not present

## 2015-05-12 DIAGNOSIS — J189 Pneumonia, unspecified organism: Secondary | ICD-10-CM

## 2015-05-12 DIAGNOSIS — Z5112 Encounter for antineoplastic immunotherapy: Secondary | ICD-10-CM | POA: Insufficient documentation

## 2015-05-12 DIAGNOSIS — F17210 Nicotine dependence, cigarettes, uncomplicated: Secondary | ICD-10-CM | POA: Insufficient documentation

## 2015-05-12 DIAGNOSIS — Z87442 Personal history of urinary calculi: Secondary | ICD-10-CM | POA: Diagnosis not present

## 2015-05-12 LAB — COMPREHENSIVE METABOLIC PANEL
ALT: 21 U/L (ref 17–63)
AST: 26 U/L (ref 15–41)
Albumin: 3.5 g/dL (ref 3.5–5.0)
Alkaline Phosphatase: 41 U/L (ref 38–126)
Anion gap: 5 (ref 5–15)
BILIRUBIN TOTAL: 0.8 mg/dL (ref 0.3–1.2)
BUN: 16 mg/dL (ref 6–20)
CO2: 27 mmol/L (ref 22–32)
CREATININE: 1.05 mg/dL (ref 0.61–1.24)
Calcium: 8.5 mg/dL — ABNORMAL LOW (ref 8.9–10.3)
Chloride: 104 mmol/L (ref 101–111)
Glucose, Bld: 118 mg/dL — ABNORMAL HIGH (ref 65–99)
Potassium: 4.3 mmol/L (ref 3.5–5.1)
Sodium: 136 mmol/L (ref 135–145)
TOTAL PROTEIN: 7.6 g/dL (ref 6.5–8.1)

## 2015-05-12 LAB — CBC WITH DIFFERENTIAL/PLATELET
BASOS ABS: 0 10*3/uL (ref 0–0.1)
Basophils Relative: 1 %
Eosinophils Absolute: 0.2 10*3/uL (ref 0–0.7)
Eosinophils Relative: 4 %
HEMATOCRIT: 36 % — AB (ref 40.0–52.0)
Hemoglobin: 12 g/dL — ABNORMAL LOW (ref 13.0–18.0)
LYMPHS ABS: 1.9 10*3/uL (ref 1.0–3.6)
LYMPHS PCT: 42 %
MCH: 29.4 pg (ref 26.0–34.0)
MCHC: 33.4 g/dL (ref 32.0–36.0)
MCV: 87.9 fL (ref 80.0–100.0)
MONO ABS: 0.7 10*3/uL (ref 0.2–1.0)
Monocytes Relative: 17 %
NEUTROS ABS: 1.6 10*3/uL (ref 1.4–6.5)
Neutrophils Relative %: 36 %
Platelets: 152 10*3/uL (ref 150–440)
RBC: 4.1 MIL/uL — AB (ref 4.40–5.90)
RDW: 12.7 % (ref 11.5–14.5)
WBC: 4.4 10*3/uL (ref 3.8–10.6)

## 2015-05-12 LAB — TSH: TSH: 2.172 u[IU]/mL (ref 0.350–4.500)

## 2015-05-12 MED ORDER — SODIUM CHLORIDE 0.9% FLUSH
10.0000 mL | INTRAVENOUS | Status: DC | PRN
  Administered 2015-05-12: 10 mL
  Filled 2015-05-12: qty 10

## 2015-05-12 MED ORDER — SODIUM CHLORIDE 0.9 % IV SOLN
Freq: Once | INTRAVENOUS | Status: AC
  Administered 2015-05-12: 15:00:00 via INTRAVENOUS
  Filled 2015-05-12: qty 1000

## 2015-05-12 MED ORDER — HEPARIN SOD (PORK) LOCK FLUSH 100 UNIT/ML IV SOLN
500.0000 [IU] | Freq: Once | INTRAVENOUS | Status: AC | PRN
  Administered 2015-05-12: 500 [IU]
  Filled 2015-05-12: qty 5

## 2015-05-12 MED ORDER — SODIUM CHLORIDE 0.9 % IV SOLN
1200.0000 mg | Freq: Once | INTRAVENOUS | Status: AC
  Administered 2015-05-12: 1200 mg via INTRAVENOUS
  Filled 2015-05-12: qty 20

## 2015-05-26 ENCOUNTER — Ambulatory Visit: Payer: No Typology Code available for payment source

## 2015-05-26 ENCOUNTER — Inpatient Hospital Stay: Payer: BLUE CROSS/BLUE SHIELD

## 2015-05-26 ENCOUNTER — Inpatient Hospital Stay (HOSPITAL_BASED_OUTPATIENT_CLINIC_OR_DEPARTMENT_OTHER): Payer: BLUE CROSS/BLUE SHIELD | Admitting: Internal Medicine

## 2015-05-26 ENCOUNTER — Encounter: Payer: Self-pay | Admitting: Internal Medicine

## 2015-05-26 VITALS — BP 135/78 | HR 69 | Temp 97.1°F | Resp 18 | Wt 117.7 lb

## 2015-05-26 DIAGNOSIS — B1920 Unspecified viral hepatitis C without hepatic coma: Secondary | ICD-10-CM | POA: Diagnosis not present

## 2015-05-26 DIAGNOSIS — R52 Pain, unspecified: Secondary | ICD-10-CM

## 2015-05-26 DIAGNOSIS — C3411 Malignant neoplasm of upper lobe, right bronchus or lung: Secondary | ICD-10-CM | POA: Diagnosis not present

## 2015-05-26 DIAGNOSIS — M549 Dorsalgia, unspecified: Secondary | ICD-10-CM

## 2015-05-26 DIAGNOSIS — Z8781 Personal history of (healed) traumatic fracture: Secondary | ICD-10-CM

## 2015-05-26 DIAGNOSIS — Z79899 Other long term (current) drug therapy: Secondary | ICD-10-CM | POA: Diagnosis not present

## 2015-05-26 DIAGNOSIS — G8929 Other chronic pain: Secondary | ICD-10-CM | POA: Diagnosis not present

## 2015-05-26 DIAGNOSIS — R0602 Shortness of breath: Secondary | ICD-10-CM

## 2015-05-26 DIAGNOSIS — Z87442 Personal history of urinary calculi: Secondary | ICD-10-CM

## 2015-05-26 DIAGNOSIS — C3412 Malignant neoplasm of upper lobe, left bronchus or lung: Secondary | ICD-10-CM

## 2015-05-26 DIAGNOSIS — M129 Arthropathy, unspecified: Secondary | ICD-10-CM

## 2015-05-26 DIAGNOSIS — J189 Pneumonia, unspecified organism: Secondary | ICD-10-CM

## 2015-05-26 DIAGNOSIS — F17210 Nicotine dependence, cigarettes, uncomplicated: Secondary | ICD-10-CM

## 2015-05-26 LAB — CBC WITH DIFFERENTIAL/PLATELET
BASOS PCT: 1 %
Basophils Absolute: 0 10*3/uL (ref 0–0.1)
EOS ABS: 0.1 10*3/uL (ref 0–0.7)
EOS PCT: 3 %
HCT: 39.1 % — ABNORMAL LOW (ref 40.0–52.0)
Hemoglobin: 12.9 g/dL — ABNORMAL LOW (ref 13.0–18.0)
LYMPHS ABS: 1.3 10*3/uL (ref 1.0–3.6)
Lymphocytes Relative: 29 %
MCH: 29 pg (ref 26.0–34.0)
MCHC: 33.1 g/dL (ref 32.0–36.0)
MCV: 87.7 fL (ref 80.0–100.0)
MONO ABS: 1.3 10*3/uL — AB (ref 0.2–1.0)
Monocytes Relative: 28 %
NEUTROS ABS: 1.8 10*3/uL (ref 1.4–6.5)
Neutrophils Relative %: 39 %
PLATELETS: 152 10*3/uL (ref 150–440)
RBC: 4.46 MIL/uL (ref 4.40–5.90)
RDW: 12.5 % (ref 11.5–14.5)
WBC: 4.5 10*3/uL (ref 3.8–10.6)

## 2015-05-26 LAB — COMPREHENSIVE METABOLIC PANEL
ALT: 19 U/L (ref 17–63)
ANION GAP: 5 (ref 5–15)
AST: 28 U/L (ref 15–41)
Albumin: 3.6 g/dL (ref 3.5–5.0)
Alkaline Phosphatase: 36 U/L — ABNORMAL LOW (ref 38–126)
BUN: 11 mg/dL (ref 6–20)
CHLORIDE: 102 mmol/L (ref 101–111)
CO2: 25 mmol/L (ref 22–32)
Calcium: 8.4 mg/dL — ABNORMAL LOW (ref 8.9–10.3)
Creatinine, Ser: 1.05 mg/dL (ref 0.61–1.24)
GFR calc Af Amer: >60 mL/min (ref >60)
Glucose, Bld: 96 mg/dL (ref 65–99)
Potassium: 4 mmol/L (ref 3.5–5.1)
SODIUM: 132 mmol/L — AB (ref 135–145)
Total Bilirubin: 0.6 mg/dL (ref 0.3–1.2)
Total Protein: 7.8 g/dL (ref 6.5–8.1)

## 2015-05-26 MED ORDER — OXYCODONE HCL 10 MG PO TABS: ORAL_TABLET | ORAL | Status: DC

## 2015-05-26 MED ORDER — SODIUM CHLORIDE 0.9 % IV SOLN
Freq: Once | INTRAVENOUS | Status: AC
  Administered 2015-05-26: 14:00:00 via INTRAVENOUS
  Filled 2015-05-26: qty 1000

## 2015-05-26 MED ORDER — SODIUM CHLORIDE 0.9% FLUSH
10.0000 mL | INTRAVENOUS | Status: AC | PRN
  Administered 2015-05-26: 10 mL
  Filled 2015-05-26: qty 10

## 2015-05-26 MED ORDER — HEPARIN SOD (PORK) LOCK FLUSH 100 UNIT/ML IV SOLN
500.0000 [IU] | Freq: Once | INTRAVENOUS | Status: AC | PRN
  Administered 2015-05-26: 500 [IU]
  Filled 2015-05-26: qty 5

## 2015-05-26 MED ORDER — ATEZOLIZUMAB CHEMO INJECTION 1200 MG/20ML
1200.0000 mg | Freq: Once | INTRAVENOUS | Status: AC
  Administered 2015-05-26: 1200 mg via INTRAVENOUS
  Filled 2015-05-26: qty 20

## 2015-05-26 NOTE — Progress Notes (Signed)
SCP visit completed.  Treatment summary reviewed and given to patient.  ASCO answers booklet reviewed and given to patient.  CARE program and other resources the cancer center offers discussed with patient and wife.

## 2015-05-26 NOTE — Progress Notes (Signed)
[place] OFFICE PROGRESS NOTE  Patient Care Team: Tracie Harrier, MD as PCP - General (Internal Medicine)   SUMMARY OF ONCOLOGIC HISTORY:  # MARCH 2016- LUNG CA- RUL- ADENO CA [mol testing-NA sec to poor samples] STAGE III s/p chemo-RT; [SEP 2016-CT Bx for mol testing; neg for malignancy]; CT-29th  NOV 2016-  Stable/ improved right upper lobe lesion;   # CT JAN 2016- stable right upper lobe lung nodule ~ 2.5cm; but progressive RML ~1.8cm; Start Tecentriq q 3W  # Hepatitis C- follow up [facility]?   # Chronic pain- on oxycodone.jan 2017-Bone scan- neg.      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  Patient continues to complain of chronic pain- neck; right chest wall; back "all over the body".  He states that he lost his pain pills/"fell in the bathroom".   He has chronic cough without hemoptysis.Has chronic shortness of breath which is not any worse. Appetite is fair. Denies any headaches vision changes or double vision.  Denies any nausea vomiting.  REVIEW OF SYSTEMS:  Difficult to assess review of systems given patient did not want to talk much. PAST MEDICAL HISTORY :  Past Medical History  Diagnosis Date  . Lung cancer ([place])   . Arthritis   . Kidney stones   . Chicken pox     PAST SURGICAL HISTORY :   Past Surgical History  Procedure Laterality Date  . Orif femur fracture Right 2010  . Hand surgery Right 2010    ORIF of right index finder, 2nd carpometacarpal joint dislocation right hand, proximal right humerus  . Amputation of index finger  2010    FAMILY HISTORY :  No family history on file.  SOCIAL HISTORY:   Social History  Substance Use Topics  . Smoking status: Current Every Day Smoker --  2.00 packs/day for 20 years  . Smokeless tobacco: Never Used  . Alcohol Use: 0.0 oz/week    0 Standard drinks or equivalent per week    ALLERGIES:  has No Known Allergies.  MEDICATIONS:  Current Outpatient Prescriptions  Medication Sig Dispense Refill  . Oxycodone HCl 10 MG TABS Take 1 once every 8-12 hours as needed for pain. 90 tablet 0  . Aspirin-Salicylamide-Caffeine (BC HEADACHE POWDER PO) Take by mouth as needed (I take goodies BC powder intermittently.). Reported on 05/26/2015     No current facility-administered medications for this visit.    PHYSICAL EXAMINATION: ECOG PERFORMANCE STATUS: 0 - Asymptomatic  BP 135/78 mmHg  Pulse 69  Temp(Src) 97.1 F (36.2 C) (Tympanic)  Resp 18  Wt 117 lb 11.6 oz (53.4 kg)  Filed Weights   05/26/15 1300  Weight: 117 lb 11.6 oz (53.4 kg)    GENERAL:  Thin built/ moderately nourished; Alert, no distress and comfortable. Accompanied by his wife. EYES: no pallor or icterus OROPHARYNX: no thrush or ulceration; poor dentition  NECK: supple, no masses felt LYMPH:  no palpable lymphadenopathy in the cervical, axillary or inguinal regions LUNGS:  Decreased breath sounds bilaterally to auscultation. No wheeze or crackles HEART/CVS: regular rate & rhythm and no murmurs; No lower extremity edema ABDOMEN:abdomen soft, non-tender and normal bowel sounds Musculoskeletal:no cyanosis of digits and no clubbing  PSYCH: alert & oriented x  3 with fluent speech NEURO: no focal motor/sensory deficits SKIN:  no rashes or significant lesions  LABORATORY DATA:  I have reviewed the data as listed    Component Value Date/Time   NA 132* 05/26/2015 1250   NA 136 07/27/2014 0856   K 4.0 05/26/2015 1250   K 3.9 07/27/2014 0856   CL 102 05/26/2015 1250   CL 104 07/27/2014 0856   CO2 25 05/26/2015 1250   CO2 27 07/27/2014 0856   GLUCOSE 96 05/26/2015 1250   GLUCOSE 151* 07/27/2014 0856   BUN 11 05/26/2015 1250   BUN 10 07/27/2014 0856   CREATININE  1.05 05/26/2015 1250   CREATININE 0.86 07/27/2014 0856   CREATININE 0.77 07/06/2014   CALCIUM 8.4* 05/26/2015 1250   CALCIUM 8.6* 07/27/2014 0856   PROT 7.8 05/26/2015 1250   PROT 8.8* 06/28/2014 1512   ALBUMIN 3.6 05/26/2015 1250   ALBUMIN 4.0 06/28/2014 1512   AST 28 05/26/2015 1250   AST 37 06/28/2014 1512   ALT 19 05/26/2015 1250   ALT 43 06/28/2014 1512   ALKPHOS 36* 05/26/2015 1250   ALKPHOS 53 06/28/2014 1512   BILITOT 0.6 05/26/2015 1250   BILITOT 0.3 06/28/2014 1512   GFRNONAA >60 05/26/2015 1250   GFRNONAA >60 07/27/2014 0856   GFRAA >60 05/26/2015 1250   GFRAA >60 07/27/2014 0856    No results found for: SPEP, UPEP  Lab Results  Component Value Date   WBC 4.5 05/26/2015   NEUTROABS 1.8 05/26/2015   HGB 12.9* 05/26/2015   HCT 39.1* 05/26/2015   MCV 87.7 05/26/2015   PLT 152 05/26/2015      Chemistry      Component Value Date/Time   NA 132* 05/26/2015 1250   NA 136 07/27/2014 0856   K 4.0 05/26/2015 1250   K 3.9 07/27/2014 0856   CL 102 05/26/2015 1250   CL 104 07/27/2014 0856   CO2 25 05/26/2015 1250   CO2 27 07/27/2014 0856   BUN 11 05/26/2015 1250   BUN 10 07/27/2014 0856   CREATININE 1.05 05/26/2015 1250   CREATININE 0.86 07/27/2014 0856   CREATININE 0.77 07/06/2014      Component Value Date/Time   CALCIUM 8.4* 05/26/2015 1250   CALCIUM 8.6* 07/27/2014 0856   ALKPHOS 36* 05/26/2015 1250   ALKPHOS 53 06/28/2014 1512   AST 28 05/26/2015 1250   AST 37 06/28/2014 1512   ALT 19 05/26/2015 1250   ALT 43 06/28/2014 1512   BILITOT 0.6 05/26/2015 1250   BILITOT 0.3 06/28/2014 1512       ASSESSMENT & PLAN:   # ADENO CA LUNG CA RUL-STAGE III  Status post chemoradiation  Finished Summer 2016. CT scan-  January 2017- shows stable right upper lobe lesion; progressive right middle lobe lesion 1.8 cm.  Patient on second line therapy Tecentriq q 3 weeks;  Status post cycle #1.  I reviewed the images with the patient and his wife in detail again.  #   Patient tolerated the treatment fairly well;  Proceed with cycle #2 today.  Labs within normal limits. We will plan to get CT scan after 3-4 cycles.   # Active smoker-  Patient seems not interested in quitting smoking.  #  Chronic pain- unclear etiology. likely unrelated to malignancy.  Bone scan was negative. Prescription for oxycodone given.  I had a long discussion with him that he should find a PCP/  Or  Pain management doctor for his pain control.   #  25  minutes face-to-face with the patient with more than 50% of time spent on counseling and coordination.       Cammie Sickle, MD 05/26/2015 1:34 PM

## 2015-05-31 ENCOUNTER — Ambulatory Visit: Payer: No Typology Code available for payment source

## 2015-06-02 ENCOUNTER — Ambulatory Visit: Payer: No Typology Code available for payment source | Admitting: Internal Medicine

## 2015-06-02 ENCOUNTER — Other Ambulatory Visit: Payer: No Typology Code available for payment source

## 2015-06-15 ENCOUNTER — Telehealth: Payer: Self-pay | Admitting: *Deleted

## 2015-06-15 NOTE — Telephone Encounter (Signed)
LM INFORMED THE PT THAT I WAS CALLING TO GET HIM SCHEDULED. I GAVE MY NAME AND NUMBER AND ASKED THE PT TO PLEASE RETURN MY CALL.Marland KitchenMarland KitchenTD

## 2015-06-16 ENCOUNTER — Inpatient Hospital Stay (HOSPITAL_BASED_OUTPATIENT_CLINIC_OR_DEPARTMENT_OTHER): Payer: BLUE CROSS/BLUE SHIELD | Admitting: Internal Medicine

## 2015-06-16 ENCOUNTER — Inpatient Hospital Stay: Payer: BLUE CROSS/BLUE SHIELD

## 2015-06-16 ENCOUNTER — Inpatient Hospital Stay: Payer: BLUE CROSS/BLUE SHIELD | Attending: Internal Medicine

## 2015-06-16 VITALS — BP 102/52 | HR 65 | Temp 98.0°F | Resp 16 | Ht 69.0 in | Wt 117.0 lb

## 2015-06-16 DIAGNOSIS — Z87442 Personal history of urinary calculi: Secondary | ICD-10-CM | POA: Insufficient documentation

## 2015-06-16 DIAGNOSIS — Z923 Personal history of irradiation: Secondary | ICD-10-CM | POA: Insufficient documentation

## 2015-06-16 DIAGNOSIS — F17210 Nicotine dependence, cigarettes, uncomplicated: Secondary | ICD-10-CM | POA: Insufficient documentation

## 2015-06-16 DIAGNOSIS — R0602 Shortness of breath: Secondary | ICD-10-CM

## 2015-06-16 DIAGNOSIS — Z5111 Encounter for antineoplastic chemotherapy: Secondary | ICD-10-CM | POA: Diagnosis not present

## 2015-06-16 DIAGNOSIS — C3412 Malignant neoplasm of upper lobe, left bronchus or lung: Secondary | ICD-10-CM

## 2015-06-16 DIAGNOSIS — C342 Malignant neoplasm of middle lobe, bronchus or lung: Secondary | ICD-10-CM

## 2015-06-16 DIAGNOSIS — C3411 Malignant neoplasm of upper lobe, right bronchus or lung: Secondary | ICD-10-CM

## 2015-06-16 DIAGNOSIS — R05 Cough: Secondary | ICD-10-CM | POA: Diagnosis not present

## 2015-06-16 DIAGNOSIS — G8929 Other chronic pain: Secondary | ICD-10-CM | POA: Insufficient documentation

## 2015-06-16 DIAGNOSIS — Z79899 Other long term (current) drug therapy: Secondary | ICD-10-CM | POA: Insufficient documentation

## 2015-06-16 DIAGNOSIS — Z7982 Long term (current) use of aspirin: Secondary | ICD-10-CM | POA: Diagnosis not present

## 2015-06-16 DIAGNOSIS — M1990 Unspecified osteoarthritis, unspecified site: Secondary | ICD-10-CM | POA: Diagnosis not present

## 2015-06-16 LAB — COMPREHENSIVE METABOLIC PANEL
ALK PHOS: 43 U/L (ref 38–126)
ALT: 26 U/L (ref 17–63)
AST: 32 U/L (ref 15–41)
Albumin: 3.6 g/dL (ref 3.5–5.0)
Anion gap: 3 — ABNORMAL LOW (ref 5–15)
BUN: 15 mg/dL (ref 6–20)
CALCIUM: 8.3 mg/dL — AB (ref 8.9–10.3)
CO2: 25 mmol/L (ref 22–32)
CREATININE: 1.03 mg/dL (ref 0.61–1.24)
Chloride: 106 mmol/L (ref 101–111)
Glucose, Bld: 108 mg/dL — ABNORMAL HIGH (ref 65–99)
Potassium: 4.2 mmol/L (ref 3.5–5.1)
Sodium: 134 mmol/L — ABNORMAL LOW (ref 135–145)
Total Bilirubin: 0.5 mg/dL (ref 0.3–1.2)
Total Protein: 7.7 g/dL (ref 6.5–8.1)

## 2015-06-16 LAB — CBC WITH DIFFERENTIAL/PLATELET
BASOS ABS: 0 10*3/uL (ref 0–0.1)
Basophils Relative: 1 %
Eosinophils Absolute: 0.1 10*3/uL (ref 0–0.7)
Eosinophils Relative: 2 %
HCT: 35.4 % — ABNORMAL LOW (ref 40.0–52.0)
HEMOGLOBIN: 11.9 g/dL — AB (ref 13.0–18.0)
LYMPHS ABS: 2.1 10*3/uL (ref 1.0–3.6)
LYMPHS PCT: 53 %
MCH: 29.3 pg (ref 26.0–34.0)
MCHC: 33.7 g/dL (ref 32.0–36.0)
MCV: 87.1 fL (ref 80.0–100.0)
Monocytes Absolute: 0.7 10*3/uL (ref 0.2–1.0)
Monocytes Relative: 17 %
NEUTROS ABS: 1.1 10*3/uL — AB (ref 1.4–6.5)
NEUTROS PCT: 27 %
Platelets: 156 10*3/uL (ref 150–440)
RBC: 4.06 MIL/uL — AB (ref 4.40–5.90)
RDW: 12.9 % (ref 11.5–14.5)
WBC: 4 10*3/uL (ref 3.8–10.6)

## 2015-06-16 MED ORDER — SODIUM CHLORIDE 0.9 % IV SOLN
Freq: Once | INTRAVENOUS | Status: AC
  Administered 2015-06-16: 15:00:00 via INTRAVENOUS
  Filled 2015-06-16: qty 1000

## 2015-06-16 MED ORDER — HEPARIN SOD (PORK) LOCK FLUSH 100 UNIT/ML IV SOLN
500.0000 [IU] | Freq: Once | INTRAVENOUS | Status: AC
  Administered 2015-06-16: 500 [IU] via INTRAVENOUS
  Filled 2015-06-16: qty 5

## 2015-06-16 MED ORDER — SODIUM CHLORIDE 0.9 % IV SOLN
1200.0000 mg | Freq: Once | INTRAVENOUS | Status: AC
  Administered 2015-06-16: 1200 mg via INTRAVENOUS
  Filled 2015-06-16: qty 20

## 2015-06-16 MED ORDER — SODIUM CHLORIDE 0.9% FLUSH
10.0000 mL | INTRAVENOUS | Status: DC | PRN
  Administered 2015-06-16: 10 mL via INTRAVENOUS
  Filled 2015-06-16: qty 10

## 2015-06-16 NOTE — Progress Notes (Signed)
[place] OFFICE PROGRESS NOTE  Patient Care Team: Tracie Harrier, MD as PCP - General (Internal Medicine)   SUMMARY OF ONCOLOGIC HISTORY:  # MARCH 2016- LUNG CA- RUL- ADENO CA [mol testing-NA sec to poor samples] STAGE III s/p chemo-RT; [SEP 2016-CT Bx for mol testing; neg for malignancy]; CT-29th  NOV 2016-  Stable/ improved right upper lobe lesion;   # CT JAN 2016- stable right upper lobe lung nodule ~ 2.5cm; but progressive RML ~1.8cm; Start Tecentriq q 3W  # Hepatitis C- follow up [facility]?   # Chronic pain- on oxycodone.jan 2017-Bone scan- neg.      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  Patient has chronic cough chronic shortness of breath or any worse. Denies any hemoptysis. Appetite good. Chronic pain stable. He has not lost any weight. Appetite is fair. Denies any headaches vision changes or double vision.  Denies any nausea vomiting. No diarrhea. No skin rash.  REVIEW OF SYSTEMS:  Difficult to assess review of systems given patient did not want to talk much. PAST MEDICAL HISTORY :  Past Medical History  Diagnosis Date  . Lung cancer ([place])   . Arthritis   . Kidney stones   . Chicken pox     PAST SURGICAL HISTORY :   Past Surgical History  Procedure Laterality Date  . Orif femur fracture Right 2010  . Hand surgery Right 2010    ORIF of right index finder, 2nd carpometacarpal joint dislocation right hand, proximal right humerus  . Amputation of index finger  2010    FAMILY HISTORY :  No family history on file.  SOCIAL HISTORY:   Social History  Substance Use Topics  . Smoking status: Current Every Day Smoker -- 2.00 packs/day for 20 years    Types: Cigarettes  . Smokeless tobacco: Never Used  . Alcohol Use: 0.0 oz/week    0  Standard drinks or equivalent per week    ALLERGIES:  has No Known Allergies.  MEDICATIONS:  Current Outpatient Prescriptions  Medication Sig Dispense Refill  . Aspirin-Salicylamide-Caffeine (BC HEADACHE POWDER PO) Take by mouth as needed (I take goodies BC powder intermittently.). Reported on 05/26/2015    . Oxycodone HCl 10 MG TABS Take 1 once every 8-12 hours as needed for pain. 90 tablet 0   No current facility-administered medications for this visit.   Facility-Administered Medications Ordered in Other Visits  Medication Dose Route Frequency Provider Last Rate Last Dose  . heparin lock flush 100 unit/mL  500 Units Intravenous Once Cammie Sickle, MD      . sodium chloride flush (NS) 0.9 % injection 10 mL  10 mL Intracatheter PRN Cammie Sickle, MD   10 mL at 05/26/15 1300  . sodium chloride flush (NS) 0.9 % injection 10 mL  10 mL Intravenous PRN Cammie Sickle, MD        PHYSICAL EXAMINATION: ECOG PERFORMANCE STATUS: 0 - Asymptomatic  BP 102/52 mmHg  Pulse 65  Temp(Src) 98 F (36.7 C) (Tympanic)  Resp 16  Ht '5\' 9"'$  (1.753 m)  Wt 117 lb (53.071 kg)  BMI 17.27 kg/m2  Filed Weights   06/16/15 1427  Weight: 117 lb (53.071 kg)    GENERAL:  Thin built/ moderately nourished; Alert, no distress and  comfortable. Accompanied by his wife. EYES: no pallor or icterus OROPHARYNX: no thrush or ulceration; poor dentition  NECK: supple, no masses felt LYMPH:  no palpable lymphadenopathy in the cervical, axillary or inguinal regions LUNGS:  Decreased breath sounds bilaterally to auscultation. No wheeze or crackles HEART/CVS: regular rate & rhythm and no murmurs; No lower extremity edema ABDOMEN:abdomen soft, non-tender and normal bowel sounds Musculoskeletal:no cyanosis of digits and no clubbing  PSYCH: alert & oriented x 3 with fluent speech NEURO: no focal motor/sensory deficits SKIN:  no rashes or significant lesions  LABORATORY DATA:  I have reviewed the data  as listed    Component Value Date/Time   NA 134* 06/16/2015 1414   NA 136 07/27/2014 0856   K 4.2 06/16/2015 1414   K 3.9 07/27/2014 0856   CL 106 06/16/2015 1414   CL 104 07/27/2014 0856   CO2 25 06/16/2015 1414   CO2 27 07/27/2014 0856   GLUCOSE 108* 06/16/2015 1414   GLUCOSE 151* 07/27/2014 0856   BUN 15 06/16/2015 1414   BUN 10 07/27/2014 0856   CREATININE 1.03 06/16/2015 1414   CREATININE 0.86 07/27/2014 0856   CREATININE 0.77 07/06/2014   CALCIUM 8.3* 06/16/2015 1414   CALCIUM 8.6* 07/27/2014 0856   PROT 7.7 06/16/2015 1414   PROT 8.8* 06/28/2014 1512   ALBUMIN 3.6 06/16/2015 1414   ALBUMIN 4.0 06/28/2014 1512   AST 32 06/16/2015 1414   AST 37 06/28/2014 1512   ALT 26 06/16/2015 1414   ALT 43 06/28/2014 1512   ALKPHOS 43 06/16/2015 1414   ALKPHOS 53 06/28/2014 1512   BILITOT 0.5 06/16/2015 1414   BILITOT 0.3 06/28/2014 1512   GFRNONAA >60 06/16/2015 1414   GFRNONAA >60 07/27/2014 0856   GFRAA >60 06/16/2015 1414   GFRAA >60 07/27/2014 0856    No results found for: SPEP, UPEP  Lab Results  Component Value Date   WBC 4.0 06/16/2015   NEUTROABS 1.1* 06/16/2015   HGB 11.9* 06/16/2015   HCT 35.4* 06/16/2015   MCV 87.1 06/16/2015   PLT 156 06/16/2015      Chemistry      Component Value Date/Time   NA 134* 06/16/2015 1414   NA 136 07/27/2014 0856   K 4.2 06/16/2015 1414   K 3.9 07/27/2014 0856   CL 106 06/16/2015 1414   CL 104 07/27/2014 0856   CO2 25 06/16/2015 1414   CO2 27 07/27/2014 0856   BUN 15 06/16/2015 1414   BUN 10 07/27/2014 0856   CREATININE 1.03 06/16/2015 1414   CREATININE 0.86 07/27/2014 0856   CREATININE 0.77 07/06/2014      Component Value Date/Time   CALCIUM 8.3* 06/16/2015 1414   CALCIUM 8.6* 07/27/2014 0856   ALKPHOS 43 06/16/2015 1414   ALKPHOS 53 06/28/2014 1512   AST 32 06/16/2015 1414   AST 37 06/28/2014 1512   ALT 26 06/16/2015 1414   ALT 43 06/28/2014 1512   BILITOT 0.5 06/16/2015 1414   BILITOT 0.3 06/28/2014 1512        ASSESSMENT & PLAN:   # ADENO CA LUNG CA RUL-STAGE III  Status post chemoradiation  Finished Summer 2016. CT scan-  January 2017- shows stable right upper lobe lesion; progressive right middle lobe lesion 1.8 cm.  Patient on second line therapy Tecentriq q 3 weeks;  Status post cycle #2.    #  Patient tolerated the treatment fairly well;  Proceed with cycle #3 today.  Labs within normal limits. We will plan  to get CT scan after this cycle.   # Active smoker-  Patient seems not interested in quitting smoking.  #  Chronic pain- unclear etiology. likely unrelated to malignancy.  Bone scan was negative.   # Patient will see me back in 3 weeks/labs/infusion; CT scan 1 week prior.  #  25  minutes face-to-face with the patient with more than 50% of time spent on counseling and coordination.       Cammie Sickle, MD 06/16/2015 2:43 PM

## 2015-06-16 NOTE — Progress Notes (Signed)
ANC =1.1.  MD stated ok to go ahead with treatment today.

## 2015-06-16 NOTE — Progress Notes (Signed)
Spoke with Dr. Rogue Bussing about patient receiving treatment with Neutrophil count of 1.1 and he will get treatment today.  LJ

## 2015-06-22 ENCOUNTER — Emergency Department: Payer: BLUE CROSS/BLUE SHIELD

## 2015-06-22 ENCOUNTER — Emergency Department
Admission: EM | Admit: 2015-06-22 | Discharge: 2015-06-22 | Disposition: A | Discharge status: Home / Self Care | Payer: BLUE CROSS/BLUE SHIELD | Attending: Student | Admitting: Student

## 2015-06-22 ENCOUNTER — Encounter: Payer: Self-pay | Admitting: Emergency Medicine

## 2015-06-22 DIAGNOSIS — F17210 Nicotine dependence, cigarettes, uncomplicated: Secondary | ICD-10-CM | POA: Insufficient documentation

## 2015-06-22 DIAGNOSIS — B349 Viral infection, unspecified: Secondary | ICD-10-CM | POA: Diagnosis not present

## 2015-06-22 DIAGNOSIS — R509 Fever, unspecified: Secondary | ICD-10-CM | POA: Diagnosis present

## 2015-06-22 LAB — CBC WITH DIFFERENTIAL/PLATELET
BASOS PCT: 0 %
Basophils Absolute: 0 10*3/uL (ref 0–0.1)
EOS ABS: 0 10*3/uL (ref 0–0.7)
Eosinophils Relative: 0 %
HEMATOCRIT: 36.4 % — AB (ref 40.0–52.0)
HEMOGLOBIN: 12 g/dL — AB (ref 13.0–18.0)
LYMPHS ABS: 1.8 10*3/uL (ref 1.0–3.6)
Lymphocytes Relative: 31 %
MCH: 28.6 pg (ref 26.0–34.0)
MCHC: 33 g/dL (ref 32.0–36.0)
MCV: 86.7 fL (ref 80.0–100.0)
MONO ABS: 0.9 10*3/uL (ref 0.2–1.0)
MONOS PCT: 16 %
Neutro Abs: 3.1 10*3/uL (ref 1.4–6.5)
Neutrophils Relative %: 53 %
Platelets: 148 10*3/uL — ABNORMAL LOW (ref 150–440)
RBC: 4.2 MIL/uL — AB (ref 4.40–5.90)
RDW: 13.2 % (ref 11.5–14.5)
WBC: 5.9 10*3/uL (ref 3.8–10.6)

## 2015-06-22 LAB — URINALYSIS COMPLETE WITH MICROSCOPIC (ARMC ONLY)
BACTERIA UA: NONE SEEN
Bilirubin Urine: NEGATIVE
Glucose, UA: NEGATIVE mg/dL
HGB URINE DIPSTICK: NEGATIVE
Ketones, ur: NEGATIVE mg/dL
Leukocytes, UA: NEGATIVE
NITRITE: NEGATIVE
PROTEIN: NEGATIVE mg/dL
SPECIFIC GRAVITY, URINE: 1.006 (ref 1.005–1.030)
Squamous Epithelial / LPF: NONE SEEN
pH: 8 (ref 5.0–8.0)

## 2015-06-22 LAB — RAPID INFLUENZA A&B ANTIGENS (ARMC ONLY)
INFLUENZA A (ARMC): NEGATIVE
INFLUENZA B (ARMC): NEGATIVE

## 2015-06-22 LAB — COMPREHENSIVE METABOLIC PANEL
ALBUMIN: 3.7 g/dL (ref 3.5–5.0)
ALK PHOS: 37 U/L — AB (ref 38–126)
ALT: 40 U/L (ref 17–63)
AST: 48 U/L — AB (ref 15–41)
Anion gap: 3 — ABNORMAL LOW (ref 5–15)
BUN: 10 mg/dL (ref 6–20)
CALCIUM: 8 mg/dL — AB (ref 8.9–10.3)
CO2: 23 mmol/L (ref 22–32)
CREATININE: 1.08 mg/dL (ref 0.61–1.24)
Chloride: 100 mmol/L — ABNORMAL LOW (ref 101–111)
GFR calc Af Amer: >60 mL/min (ref >60)
GFR calc non Af Amer: >60 mL/min (ref >60)
GLUCOSE: 100 mg/dL — AB (ref 65–99)
Potassium: 4 mmol/L (ref 3.5–5.1)
SODIUM: 126 mmol/L — AB (ref 135–145)
Total Bilirubin: 0.4 mg/dL (ref 0.3–1.2)
Total Protein: 7.5 g/dL (ref 6.5–8.1)

## 2015-06-22 LAB — LACTIC ACID, PLASMA: Lactic Acid, Venous: 0.6 mmol/L (ref 0.5–2.0)

## 2015-06-22 MED ORDER — IBUPROFEN 600 MG PO TABS
600.0000 mg | ORAL_TABLET | Freq: Once | ORAL | Status: AC
  Administered 2015-06-22: 600 mg via ORAL
  Filled 2015-06-22: qty 1

## 2015-06-22 MED ORDER — SODIUM CHLORIDE 0.9 % IV BOLUS (SEPSIS)
1000.0000 mL | Freq: Once | INTRAVENOUS | Status: AC
  Administered 2015-06-22: 1000 mL via INTRAVENOUS

## 2015-06-22 MED ORDER — ACETAMINOPHEN 325 MG PO TABS
650.0000 mg | ORAL_TABLET | Freq: Once | ORAL | Status: AC | PRN
  Administered 2015-06-22: 650 mg via ORAL

## 2015-06-22 MED ORDER — ACETAMINOPHEN 325 MG PO TABS
ORAL_TABLET | ORAL | Status: AC
  Filled 2015-06-22: qty 2

## 2015-06-22 NOTE — ED Notes (Signed)
Patient denies pain and is resting comfortably.  

## 2015-06-22 NOTE — ED Notes (Signed)
Family at bedside. 

## 2015-06-22 NOTE — ED Notes (Signed)
Fever and cough since yesterday.

## 2015-06-22 NOTE — ED Notes (Signed)
Report to [place], Therapist, sports

## 2015-06-22 NOTE — ED Notes (Signed)
Pt currently being treated lung cancer and will be moved to ER Room 15.

## 2015-06-22 NOTE — ED Notes (Signed)
Report given to Ally RN.

## 2015-06-22 NOTE — ED Provider Notes (Addendum)
[facility] Emergency Department Provider Note  ____________________________________________  Time seen: Approximately 8:44 PM  I have reviewed the triage vital signs and the nursing notes.   HISTORY  Chief Complaint Fever    HPI Mario [name] is a 55 y.o. male with history of lung cancer status post chemoradiation which finished in late 2016 who presents with cough, sneezing, runny nose, nasal congestion and fever that began yesterday, gradual onset, intermittent, currently mild to moderate, no modifying factors. He has not tried any palliative measures at home. No chest pain or shortness of breath, no abdominal pain, vomiting, diarrhea or dysuria. His wife is also sick with upper respiratory tract infection symptoms.   Past Medical History  Diagnosis Date  . Lung cancer ([place])   . Arthritis   . Kidney stones   . Chicken pox     Patient Active Problem List   Diagnosis Date Noted  . Lung cancer ([place]) 08/21/2014  . Current tobacco use 05/26/2014  . CALCANEAL FRACTURE 01/21/2008  . ANKLE SPRAIN, RIGHT 01/21/2008    Past Surgical History  Procedure Laterality Date  . Orif femur fracture Right 2010  . Hand surgery Right 2010    ORIF of right index finder, 2nd carpometacarpal joint dislocation right hand, proximal right humerus  . Amputation of index finger  2010    Current Outpatient Rx  Name  Route  Sig  Dispense  Refill  . Aspirin-Salicylamide-Caffeine (BC HEADACHE POWDER PO)   Oral   Take by mouth as needed (I take goodies BC powder intermittently.). Reported on 05/26/2015         . Oxycodone HCl 10 MG TABS      Take 1 once every 8-12 hours as needed for pain.   90 tablet   0     Allergies Review of patient's allergies indicates no known allergies.  No family history on file.  Social History Social History  Substance Use Topics  . Smoking status: Current Every Day Smoker -- 2.00 packs/day for 20 years    Types: Cigarettes   . Smokeless tobacco: Never Used  . Alcohol Use: 0.0 oz/week    0 Standard drinks or equivalent per week    Review of Systems Constitutional: + fever/chills Eyes: No visual changes. ENT: No sore throat. Cardiovascular: Denies chest pain. Respiratory: Denies shortness of breath. Gastrointestinal: No abdominal pain.  No nausea, no vomiting.  No diarrhea.  No constipation. Genitourinary: Negative for dysuria. Musculoskeletal: Negative for back pain. Skin: Negative for rash. Neurological: Negative for headaches, focal weakness or numbness.  10-point ROS otherwise negative.  ____________________________________________   PHYSICAL EXAM:  VITAL SIGNS: ED Triage Vitals  Enc Vitals Group     BP 06/22/15 1757 144/72 mmHg     Pulse Rate 06/22/15 1757 97     Resp 06/22/15 1757 18     Temp 06/22/15 1801 103.2 F (39.6 C)     Temp Source 06/22/15 1801 Oral     SpO2 06/22/15 1757 100 %     Weight 06/22/15 1757 120 lb (54.432 kg)     Height 06/22/15 1757 '5\' 9"'$  (1.753 m)     Head Cir --      Peak Flow --      Pain Score 06/22/15 1849 0     Pain Loc --      Pain Edu? --      Excl. in [place]? --     Constitutional: Alert and oriented. Nontoxic appearing and in no acute  distress. Eyes: Conjunctivae are normal. PERRL. EOMI. Head: Atraumatic. Nose: No congestion/rhinnorhea. Mouth/Throat: Mucous membranes are moist.  Oropharynx non-erythematous. Neck: No stridor.  Supple without meningismus. Cardiovascular: Normal rate, regular rhythm. Grossly normal heart sounds.  Good peripheral circulation. Respiratory: Normal respiratory effort.  No retractions. Lungs CTAB. Gastrointestinal: Soft and nontender. No distention. No CVA tenderness. Genitourinary: deferred Musculoskeletal: No lower extremity tenderness nor edema.  No joint effusions. Neurologic:  Normal speech and language. No gross focal neurologic deficits are appreciated. No gait instability. Skin:  Skin is warm, dry and intact. No  rash noted. Psychiatric: Mood and affect are normal. Speech and behavior are normal.  ____________________________________________   LABS (all labs ordered are listed, but only abnormal results are displayed)  Labs Reviewed  COMPREHENSIVE METABOLIC PANEL - Abnormal; Notable for the following:    Sodium 126 (*)    Chloride 100 (*)    Glucose, Bld 100 (*)    Calcium 8.0 (*)    AST 48 (*)    Alkaline Phosphatase 37 (*)    Anion gap 3 (*)    All other components within normal limits  CBC WITH DIFFERENTIAL/PLATELET - Abnormal; Notable for the following:    RBC 4.20 (*)    Hemoglobin 12.0 (*)    HCT 36.4 (*)    Platelets 148 (*)    All other components within normal limits  URINALYSIS COMPLETEWITH MICROSCOPIC ([facility] ONLY) - Abnormal; Notable for the following:    Color, Urine YELLOW (*)    APPearance CLEAR (*)    All other components within normal limits  RAPID INFLUENZA A&B ANTIGENS ([facility] ONLY)  CULTURE, BLOOD (ROUTINE X 2)  CULTURE, BLOOD (ROUTINE X 2)  URINE CULTURE  LACTIC ACID, PLASMA  LACTIC ACID, PLASMA   ____________________________________________  EKG  ED ECG REPORT I, Joanne Gavel, the attending physician, personally viewed and interpreted this ECG.   Date: 06/22/2015  EKG Time: 18:42  Rate: 79  Rhythm: normal sinus rhythm  Axis: normal  Intervals:none  ST&T Change: No acute ST elevation.Q waves in V1 and V2 are chronic when compared to EKG obtained 04/07/2010.  ____________________________________________  RADIOLOGY  CXR IMPRESSION: Stable right upper lung opacity, consistent with known treated lung carcinoma. See prior CT report. No acute findings or significant change. ____________________________________________   PROCEDURES  Procedure(s) performed: None  Critical Care performed: No  ____________________________________________   INITIAL IMPRESSION / ASSESSMENT AND PLAN / ED COURSE  Pertinent labs & imaging results that were available  during my care of the patient were reviewed by me and considered in my medical decision making (see chart for details).  DONEVIN SAINSBURY is a 55 y.o. male with history of lung cancer status post chemoradiation which finished in late 2016 who presents with cough, sneezing, runny nose, nasal congestion and fever that began yesterday. On exam, he is generally well-appearing and in no acute distress. Initially febrile with temperature 103.2 however his initial reference her rate was reassuring. Supple rupture related been documented in the 30s however these appear to be spurious as he is resting comfortably with no increased work of breathing and no tachypnea at this time. No tachycardia. Suspect viral syndrome given his wife who is sick with URI symptoms and is actively sniffling and coughing at the bedside. Labs reviewed and CMP is notable for hyponatremia with sodium of 126 however the patient denies any generalized weakness, we'll give IV fluids. CBC with mild anemia. Influenza negative. Urinalysis is not consistent with infection. Chest x-ray with  no acute cardiopulmonary abnormality. Lactic acid pending. Reassess for disposition.  ----------------------------------------- 11:19 PM on 06/22/2015 ----------------------------------------- Reassuring venous lactic acid is 0.6. I doubt that the patient's symptoms today represent a serious bacterial illness/sepsis. He is requesting discharge at this time stating that he feels much better. He has removed all his monitor leads and BP cuff, dressed himself and is adamant that he be discharged now. I discussed with him that I think his symptoms are likely secondary to viral syndrome. We discussed supportive care. We also discussed his sodium level. Previously his sodium level has been as low as 132. I discussed with him that he needs to follow-up quickly with his primary care doctor. He will call in the morning to arrange follow-up appointment. We discussed  meticulous return precautions and he and his wife at bedside are comfortable with the discharge plan. DC home.  ____________________________________________   FINAL CLINICAL IMPRESSION(S) / ED DIAGNOSES  Final diagnoses:  Viral syndrome      Joanne Gavel, MD 06/22/15 2411  Joanne Gavel, MD 06/22/15 2324

## 2015-06-23 ENCOUNTER — Telehealth: Payer: Self-pay | Admitting: *Deleted

## 2015-06-23 NOTE — Telephone Encounter (Signed)
Spoke with patient. He states that he 'feels much better this morning" "My granddaughters were all sick this week with gi bug. I think I may have gotten sick from them." Instructed pt per Dr. Aletha Halim orders to keep his upcoming apts with Dr. Rogue Bussing. No need at this time to f/u any sooner with Dr. Rogue Bussing. Pt thanked me for returning his phone call.

## 2015-06-23 NOTE — Telephone Encounter (Signed)
-----   Message from [place] sent at 06/23/2015  9:18 AM EDT ----- Patient went to the hospital yesterday for a fever, called and left a message and wants Dr B to look at his chart and see if he needs to come in....please call patient back!

## 2015-06-23 NOTE — Telephone Encounter (Deleted)
Pt states that she continues to have several episodes of nausea/vomiting on a weekly basis. Pt also reports persistent headache/dizziness. Pt using zofran prn for nausea. She is requesting a RF on this drug.  I spoke with Dr. Rogue Bussing. V/o given to 1. Schedule MRI of brain to r/o brain mets as soon as possible per md order.   2. Schedule pt to see [facility] gastro-Dr. Donnella Sham or Lyla Glassing to work up persistent n&V.   Pt notified of md orders. Pt was very distressed about remembering the details of the apts. She asked that we contact patient's daughter Owen,Britney at [phone] for these appointments. I spoke with Britney, who prefers that apts made before 2pm if possible. Karsten Fells has to pick up her kids at school. She prefers am apt if possible. This msg was provided to our schedulers, who will contact [place] with the appointments.  Approximately 40 mins was spent on the phone with the patient to review the plan of care. She also had multiple questions about upcoming ct scan appointments and the location of her test. She expressed being "personally stressed with understanding why I need to go, where I need to these tests." I reassured the patient that I would review these appointments with her daughter, Karsten Fells.

## 2015-06-24 LAB — URINE CULTURE: Culture: NO GROWTH

## 2015-06-27 LAB — CULTURE, BLOOD (ROUTINE X 2)
CULTURE: NO GROWTH
Culture: NO GROWTH

## 2015-06-30 ENCOUNTER — Ambulatory Visit
Admission: RE | Admit: 2015-06-30 | Discharge: 2015-06-30 | Disposition: A | Discharge status: Home / Self Care | Payer: BLUE CROSS/BLUE SHIELD | Source: Ambulatory Visit | Attending: Internal Medicine | Admitting: Internal Medicine

## 2015-06-30 DIAGNOSIS — R918 Other nonspecific abnormal finding of lung field: Secondary | ICD-10-CM | POA: Insufficient documentation

## 2015-06-30 DIAGNOSIS — C342 Malignant neoplasm of middle lobe, bronchus or lung: Secondary | ICD-10-CM | POA: Diagnosis not present

## 2015-06-30 DIAGNOSIS — J439 Emphysema, unspecified: Secondary | ICD-10-CM | POA: Diagnosis not present

## 2015-06-30 MED ORDER — IOPAMIDOL (ISOVUE-300) INJECTION 61%
75.0000 mL | Freq: Once | INTRAVENOUS | Status: AC | PRN
  Administered 2015-06-30: 75 mL via INTRAVENOUS

## 2015-07-07 ENCOUNTER — Inpatient Hospital Stay: Payer: BLUE CROSS/BLUE SHIELD | Attending: Internal Medicine

## 2015-07-07 ENCOUNTER — Inpatient Hospital Stay: Payer: BLUE CROSS/BLUE SHIELD

## 2015-07-07 ENCOUNTER — Inpatient Hospital Stay (HOSPITAL_BASED_OUTPATIENT_CLINIC_OR_DEPARTMENT_OTHER): Payer: BLUE CROSS/BLUE SHIELD | Admitting: Internal Medicine

## 2015-07-07 ENCOUNTER — Other Ambulatory Visit: Payer: Self-pay | Admitting: *Deleted

## 2015-07-07 VITALS — BP 97/52 | HR 73 | Temp 97.4°F | Resp 22 | Wt 114.0 lb

## 2015-07-07 DIAGNOSIS — F17210 Nicotine dependence, cigarettes, uncomplicated: Secondary | ICD-10-CM | POA: Diagnosis not present

## 2015-07-07 DIAGNOSIS — C3411 Malignant neoplasm of upper lobe, right bronchus or lung: Secondary | ICD-10-CM

## 2015-07-07 DIAGNOSIS — C342 Malignant neoplasm of middle lobe, bronchus or lung: Secondary | ICD-10-CM

## 2015-07-07 DIAGNOSIS — R634 Abnormal weight loss: Secondary | ICD-10-CM | POA: Diagnosis not present

## 2015-07-07 DIAGNOSIS — M129 Arthropathy, unspecified: Secondary | ICD-10-CM | POA: Diagnosis not present

## 2015-07-07 DIAGNOSIS — G8929 Other chronic pain: Secondary | ICD-10-CM | POA: Insufficient documentation

## 2015-07-07 DIAGNOSIS — C801 Malignant (primary) neoplasm, unspecified: Secondary | ICD-10-CM

## 2015-07-07 DIAGNOSIS — Z923 Personal history of irradiation: Secondary | ICD-10-CM | POA: Insufficient documentation

## 2015-07-07 DIAGNOSIS — M549 Dorsalgia, unspecified: Secondary | ICD-10-CM

## 2015-07-07 DIAGNOSIS — R05 Cough: Secondary | ICD-10-CM

## 2015-07-07 DIAGNOSIS — C3412 Malignant neoplasm of upper lobe, left bronchus or lung: Secondary | ICD-10-CM | POA: Insufficient documentation

## 2015-07-07 DIAGNOSIS — J189 Pneumonia, unspecified organism: Secondary | ICD-10-CM

## 2015-07-07 DIAGNOSIS — Z87442 Personal history of urinary calculi: Secondary | ICD-10-CM

## 2015-07-07 DIAGNOSIS — Z9221 Personal history of antineoplastic chemotherapy: Secondary | ICD-10-CM

## 2015-07-07 DIAGNOSIS — R0602 Shortness of breath: Secondary | ICD-10-CM | POA: Diagnosis not present

## 2015-07-07 DIAGNOSIS — B1920 Unspecified viral hepatitis C without hepatic coma: Secondary | ICD-10-CM | POA: Diagnosis not present

## 2015-07-07 LAB — CBC WITH DIFFERENTIAL/PLATELET
BASOS ABS: 0 10*3/uL (ref 0–0.1)
BASOS PCT: 1 %
Eosinophils Absolute: 0.1 10*3/uL (ref 0–0.7)
Eosinophils Relative: 1 %
HEMATOCRIT: 33.6 % — AB (ref 40.0–52.0)
HEMOGLOBIN: 11.3 g/dL — AB (ref 13.0–18.0)
Lymphocytes Relative: 48 %
Lymphs Abs: 2.5 10*3/uL (ref 1.0–3.6)
MCH: 29 pg (ref 26.0–34.0)
MCHC: 33.5 g/dL (ref 32.0–36.0)
MCV: 86.3 fL (ref 80.0–100.0)
MONOS PCT: 13 %
Monocytes Absolute: 0.7 10*3/uL (ref 0.2–1.0)
NEUTROS ABS: 2 10*3/uL (ref 1.4–6.5)
NEUTROS PCT: 37 %
Platelets: 253 10*3/uL (ref 150–440)
RBC: 3.89 MIL/uL — ABNORMAL LOW (ref 4.40–5.90)
RDW: 13.2 % (ref 11.5–14.5)
WBC: 5.2 10*3/uL (ref 3.8–10.6)

## 2015-07-07 LAB — COMPREHENSIVE METABOLIC PANEL
ALBUMIN: 3.3 g/dL — AB (ref 3.5–5.0)
ALK PHOS: 39 U/L (ref 38–126)
ALT: 57 U/L (ref 17–63)
AST: 50 U/L — AB (ref 15–41)
Anion gap: 3 — ABNORMAL LOW (ref 5–15)
BILIRUBIN TOTAL: 0.4 mg/dL (ref 0.3–1.2)
BUN: 17 mg/dL (ref 6–20)
CALCIUM: 8.7 mg/dL — AB (ref 8.9–10.3)
CO2: 27 mmol/L (ref 22–32)
CREATININE: 0.97 mg/dL (ref 0.61–1.24)
Chloride: 107 mmol/L (ref 101–111)
GFR calc Af Amer: >60 mL/min (ref >60)
GFR calc non Af Amer: >60 mL/min (ref >60)
GLUCOSE: 104 mg/dL — AB (ref 65–99)
Potassium: 4.2 mmol/L (ref 3.5–5.1)
Sodium: 137 mmol/L (ref 135–145)
TOTAL PROTEIN: 7.8 g/dL (ref 6.5–8.1)

## 2015-07-07 MED ORDER — HEPARIN SOD (PORK) LOCK FLUSH 100 UNIT/ML IV SOLN
INTRAVENOUS | Status: AC
  Filled 2015-07-07: qty 5

## 2015-07-07 MED ORDER — SODIUM CHLORIDE 0.9% FLUSH
10.0000 mL | INTRAVENOUS | Status: AC | PRN
  Administered 2015-07-07: 10 mL via INTRAVENOUS
  Filled 2015-07-07: qty 10

## 2015-07-07 MED ORDER — PREDNISONE 20 MG PO TABS: ORAL_TABLET | ORAL | Status: DC

## 2015-07-07 MED ORDER — ALBUTEROL SULFATE HFA 108 (90 BASE) MCG/ACT IN AERS: 2.0000 | INHALATION_SPRAY | Freq: Four times a day (QID) | RESPIRATORY_TRACT | Status: DC | PRN

## 2015-07-07 MED ORDER — OXYCODONE HCL 10 MG PO TABS: ORAL_TABLET | ORAL | Status: DC

## 2015-07-07 MED ORDER — HEPARIN SOD (PORK) LOCK FLUSH 100 UNIT/ML IV SOLN
500.0000 [IU] | Freq: Once | INTRAVENOUS | Status: AC
  Administered 2015-07-07: 500 [IU] via INTRAVENOUS

## 2015-07-07 NOTE — Progress Notes (Signed)
.Mario [name] OFFICE PROGRESS NOTE  Patient Care Team: Tracie Harrier, MD as PCP - General (Internal Medicine)   SUMMARY OF ONCOLOGIC HISTORY:  # MARCH 2016- LUNG CA- RUL- ADENO CA [mol testing-NA sec to poor samples] STAGE III s/p chemo-RT; [SEP 2016-CT Bx for mol testing; neg for malignancy]; CT-29th  NOV 2016-  Stable/ improved right upper lobe lesion;   # RECURRENT- CT JAN 2016- Stable right upper lobe lung nodule ~ 2.5cm; but progressive RML ~1.8cm; Start Tecentriq q 3W x3 ; March 30th CT 2017- Improved Lung Lesions; RML- 1.6x1.0; RUL ~3cm.   # ? Pneumonitis ? From Tecentriq- Start Prednisone  # Hepatitis C- follow up [facility]?   # Chronic pain- on oxycodone.jan 2017-Bone scan- neg.     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   Patient complains of worsening cough in the last few days. Also complains of chronic shortness of breath denies any hemoptysis. No nausea no vomiting or diarrhea.  He has poor appetite. Lost 7 pounds in the last visit. He complains of pain in his back for a few days after infusion. He was given a prescription of oxycodone at last visit- which she states it was misplaced.  Denies any headaches vision changes or double vision.  Denies any nausea vomiting.  No skin rash.  REVIEW OF SYSTEMS:  Difficult to assess review of systems given patient did not want to talk much. PAST MEDICAL HISTORY :  Past Medical History  Diagnosis Date  . Arthritis   . Kidney stones   . Chicken pox   . Lung cancer ([place]) 2015    PAST SURGICAL HISTORY :   Past Surgical History  Procedure Laterality Date  . Orif femur fracture Right 2010  . Hand surgery Right 2010    ORIF of right index finder, 2nd carpometacarpal joint dislocation  right hand, proximal right humerus  . Amputation of index finger  2010    FAMILY HISTORY :  No family history on file.  SOCIAL HISTORY:   Social History  Substance Use Topics  . Smoking status: Current Every Day Smoker -- 2.00 packs/day for 20 years    Types: Cigarettes  . Smokeless tobacco: Never Used  . Alcohol Use: 0.0 oz/week    0 Standard drinks or equivalent per week    ALLERGIES:  has No Known Allergies.  MEDICATIONS:  Current Outpatient Prescriptions  Medication Sig Dispense Refill  . Aspirin-Salicylamide-Caffeine (BC HEADACHE POWDER PO) Take by mouth as needed (I take goodies BC powder intermittently.). Reported on 07/07/2015    . Oxycodone HCl 10 MG TABS Take 1 once every 8-12 hours as needed for pain. 90 tablet 0   No current facility-administered medications for this visit.   Facility-Administered Medications Ordered in Other Visits  Medication Dose Route Frequency Provider Last Rate Last Dose  . sodium chloride flush (NS) 0.9 % injection 10 mL  10 mL Intracatheter PRN Cammie Sickle, MD   10 mL at 05/26/15 1300    PHYSICAL EXAMINATION: ECOG PERFORMANCE STATUS: 0 - Asymptomatic  BP 97/52 mmHg  Pulse 73  Temp(Src) 97.4 F (36.3 C) (Tympanic)  Resp 22  Wt 113 lb 15.7 oz (51.7 kg)  Filed Weights   07/07/15 1436  Weight: 113 lb 15.7  oz (51.7 kg)    GENERAL:  Thin built/ moderately nourished; Alert, no distress and comfortable. Accompanied by his wife. EYES: no pallor or icterus OROPHARYNX: no thrush or ulceration; poor dentition  NECK: supple, no masses felt LYMPH:  no palpable lymphadenopathy in the cervical, axillary or inguinal regions LUNGS:  Decreased breath sounds bilaterally to auscultation. No wheeze or crackles HEART/CVS: regular rate & rhythm and no murmurs; No lower extremity edema ABDOMEN:abdomen soft, non-tender and normal bowel sounds Musculoskeletal:no cyanosis of digits and no clubbing  PSYCH: alert & oriented x 3 with fluent  speech NEURO: no focal motor/sensory deficits SKIN:  no rashes or significant lesions  LABORATORY DATA:  I have reviewed the data as listed    Component Value Date/Time   NA 137 07/07/2015 1423   NA 136 07/27/2014 0856   K 4.2 07/07/2015 1423   K 3.9 07/27/2014 0856   CL 107 07/07/2015 1423   CL 104 07/27/2014 0856   CO2 27 07/07/2015 1423   CO2 27 07/27/2014 0856   GLUCOSE 104* 07/07/2015 1423   GLUCOSE 151* 07/27/2014 0856   BUN 17 07/07/2015 1423   BUN 10 07/27/2014 0856   CREATININE 0.97 07/07/2015 1423   CREATININE 0.86 07/27/2014 0856   CREATININE 0.77 07/06/2014   CALCIUM 8.7* 07/07/2015 1423   CALCIUM 8.6* 07/27/2014 0856   PROT 7.8 07/07/2015 1423   PROT 8.8* 06/28/2014 1512   ALBUMIN 3.3* 07/07/2015 1423   ALBUMIN 4.0 06/28/2014 1512   AST 50* 07/07/2015 1423   AST 37 06/28/2014 1512   ALT 57 07/07/2015 1423   ALT 43 06/28/2014 1512   ALKPHOS 39 07/07/2015 1423   ALKPHOS 53 06/28/2014 1512   BILITOT 0.4 07/07/2015 1423   BILITOT 0.3 06/28/2014 1512   GFRNONAA >60 07/07/2015 1423   GFRNONAA >60 07/27/2014 0856   GFRAA >60 07/07/2015 1423   GFRAA >60 07/27/2014 0856    No results found for: SPEP, UPEP  Lab Results  Component Value Date   WBC 5.2 07/07/2015   NEUTROABS 2.0 07/07/2015   HGB 11.3* 07/07/2015   HCT 33.6* 07/07/2015   MCV 86.3 07/07/2015   PLT 253 07/07/2015      Chemistry      Component Value Date/Time   NA 137 07/07/2015 1423   NA 136 07/27/2014 0856   K 4.2 07/07/2015 1423   K 3.9 07/27/2014 0856   CL 107 07/07/2015 1423   CL 104 07/27/2014 0856   CO2 27 07/07/2015 1423   CO2 27 07/27/2014 0856   BUN 17 07/07/2015 1423   BUN 10 07/27/2014 0856   CREATININE 0.97 07/07/2015 1423   CREATININE 0.86 07/27/2014 0856   CREATININE 0.77 07/06/2014      Component Value Date/Time   CALCIUM 8.7* 07/07/2015 1423   CALCIUM 8.6* 07/27/2014 0856   ALKPHOS 39 07/07/2015 1423   ALKPHOS 53 06/28/2014 1512   AST 50* 07/07/2015 1423    AST 37 06/28/2014 1512   ALT 57 07/07/2015 1423   ALT 43 06/28/2014 1512   BILITOT 0.4 07/07/2015 1423   BILITOT 0.3 06/28/2014 1512     Lungs/Pleura: Previously noted right upper lobe pulmonary nodule abutting the major fissure has decreased in size, currently measuring 1.6 x 1.0 cm (axial image 22 of series 3), previously 1.3 x 1.8 cm on 04/27/2015. Previously noted pleural-based mass in the periphery of the right upper lobe near the apex appears slightly less bulky than the prior examination, currently a more sessile appearing 3.1  x 1.4 cm mass-like opacity (image 14 of series 3). Today's examination demonstrates extensive new areas of ground-glass attenuation nodularity throughout the lungs bilaterally, typically in a peribronchovascular distribution. This is most extensive in the lower lungs. Mild diffuse bronchial wall thickening with mild centrilobular and moderate paraseptal emphysema. No pleural effusions.  ASSESSMENT & PLAN:   # ADENO CA LUNG CA RUL- RECURRENCE- Based on CT January 2017- shows stable right upper lobe lesion; progressive right middle lobe lesion 1.8 cm.  S/p 3 cycles of Tecentriq. CT April 2017- slight improvement of the both lesions- however shows bilateral pneumonitis-like changes [C discussion below].   # Hold cycle #4 [C discussion below]. The other option for the patient would be is to get radiation for his lung lesions.  # worsening cough/ with bilateral infiltrates- question related to Tecentriq. This is less likely from progressive malignancy. Recommend holding treatment today. Start prednisone 60 mg once a day x one week and then 20 mg prednisone once a day [total 30 pills]   # weight loss- 7 pounds the last visit. Recommend ensure/boost.  # Active smoker-  Patient seems not interested in quitting smoking.as  #  Chronic pain- unclear etiology. likely unrelated to malignancy.  Bone scan was negative. Patient was given a prescription for oxycodone at  last visit; which he says was misplaced it. Discussed that if this continues to be an issue- and this is unlikely a non-cancer-related pain- he had to go to the pain clinic. A prescription for oxycodone was given.  # Patient will see me back in 3 weeks/labs/infusion;     Cammie Sickle, MD 07/07/2015 3:05 PM

## 2015-07-07 NOTE — Progress Notes (Signed)
Patient ambulates without assistance, brought to exam room 6, accompanied by his wife.  Patient denies pain or discomfort, vitals documented.  Medication record updated, information provided by patient.

## 2015-07-07 NOTE — Progress Notes (Signed)
RN spoke with Pain Management to f/u on referral. Apt for mgmt of chronic back pain not r/t to cancer dx.  Pt's caregiver states that she lost her boyfriend's narcotic rx bottle during "moving to different house."  Dr. Rogue Bussing RF narcotic this one time; however, pt needs to keep apt with pain mgmt in May.

## 2015-07-28 ENCOUNTER — Inpatient Hospital Stay: Payer: BLUE CROSS/BLUE SHIELD

## 2015-07-28 ENCOUNTER — Inpatient Hospital Stay: Payer: BLUE CROSS/BLUE SHIELD | Admitting: Internal Medicine

## 2015-07-31 ENCOUNTER — Emergency Department: Payer: BLUE CROSS/BLUE SHIELD

## 2015-07-31 ENCOUNTER — Encounter: Payer: Self-pay | Admitting: Emergency Medicine

## 2015-07-31 ENCOUNTER — Emergency Department
Admission: EM | Admit: 2015-07-31 | Discharge: 2015-07-31 | Disposition: A | Discharge status: Home / Self Care | Payer: BLUE CROSS/BLUE SHIELD | Attending: Emergency Medicine | Admitting: Emergency Medicine

## 2015-07-31 DIAGNOSIS — Y998 Other external cause status: Secondary | ICD-10-CM | POA: Diagnosis not present

## 2015-07-31 DIAGNOSIS — Y9389 Activity, other specified: Secondary | ICD-10-CM | POA: Insufficient documentation

## 2015-07-31 DIAGNOSIS — T1490XA Injury, unspecified, initial encounter: Secondary | ICD-10-CM

## 2015-07-31 DIAGNOSIS — S62609A Fracture of unspecified phalanx of unspecified finger, initial encounter for closed fracture: Secondary | ICD-10-CM

## 2015-07-31 DIAGNOSIS — S6992XA Unspecified injury of left wrist, hand and finger(s), initial encounter: Secondary | ICD-10-CM | POA: Diagnosis present

## 2015-07-31 DIAGNOSIS — Y929 Unspecified place or not applicable: Secondary | ICD-10-CM | POA: Insufficient documentation

## 2015-07-31 DIAGNOSIS — M1990 Unspecified osteoarthritis, unspecified site: Secondary | ICD-10-CM | POA: Insufficient documentation

## 2015-07-31 DIAGNOSIS — W230XXA Caught, crushed, jammed, or pinched between moving objects, initial encounter: Secondary | ICD-10-CM | POA: Diagnosis not present

## 2015-07-31 DIAGNOSIS — S61213A Laceration without foreign body of left middle finger without damage to nail, initial encounter: Secondary | ICD-10-CM

## 2015-07-31 DIAGNOSIS — Z7982 Long term (current) use of aspirin: Secondary | ICD-10-CM | POA: Insufficient documentation

## 2015-07-31 DIAGNOSIS — F17210 Nicotine dependence, cigarettes, uncomplicated: Secondary | ICD-10-CM | POA: Diagnosis not present

## 2015-07-31 DIAGNOSIS — Z79899 Other long term (current) drug therapy: Secondary | ICD-10-CM | POA: Diagnosis not present

## 2015-07-31 DIAGNOSIS — R52 Pain, unspecified: Secondary | ICD-10-CM

## 2015-07-31 DIAGNOSIS — C3490 Malignant neoplasm of unspecified part of unspecified bronchus or lung: Secondary | ICD-10-CM | POA: Diagnosis not present

## 2015-07-31 DIAGNOSIS — S60032A Contusion of left middle finger without damage to nail, initial encounter: Secondary | ICD-10-CM

## 2015-07-31 DIAGNOSIS — S62633A Displaced fracture of distal phalanx of left middle finger, initial encounter for closed fracture: Secondary | ICD-10-CM | POA: Insufficient documentation

## 2015-07-31 MED ORDER — CEPHALEXIN 500 MG PO CAPS: 500.0000 mg | ORAL_CAPSULE | Freq: Three times a day (TID) | ORAL | Status: DC

## 2015-07-31 MED ORDER — HYDROCODONE-ACETAMINOPHEN 5-325 MG PO TABS: 1.0000 | ORAL_TABLET | Freq: Four times a day (QID) | ORAL | Status: DC | PRN

## 2015-07-31 MED ORDER — HYDROCODONE-ACETAMINOPHEN 5-325 MG PO TABS
1.0000 | ORAL_TABLET | Freq: Once | ORAL | Status: AC
  Administered 2015-07-31: 1 via ORAL
  Filled 2015-07-31: qty 1

## 2015-07-31 NOTE — ED Notes (Signed)
Patient is presenting to the ED with obvious deformity to middle finger in his left hand.  Patient reports getting hand shut in a door on Friday.  Patient is complaining of pain to finger.  Patient is in no obvious distress at this time.

## 2015-07-31 NOTE — ED Notes (Signed)
Patient states that on Friday his left hand was accidentally shut in the door. Patient has been having pain and swelling since then.

## 2015-07-31 NOTE — ED Provider Notes (Signed)
[facility] Emergency Department Provider Note  ____________________________________________  Time seen: Approximately 1:23 PM  I have reviewed the triage vital signs and the nursing notes.   HISTORY  Chief Complaint Hand Pain    HPI Mario [name] is a 55 y.o. male , NAD, presents emergency department with left middle finger pain after it was slammed in a door on Friday. States he was working on a door and it accidentally was closed shutting his finger in. Has had pain and swelling to the finger since that time. Also notes a laceration to the pad of the middle finger which is now beginning to heal. Notes he has no hand or wrist pain. If he tries to move the left middle finger, pain can radiate into the hand. Denies any numbness, weakness, tingling.   Past Medical History  Diagnosis Date  . Arthritis   . Kidney stones   . Chicken pox   . Lung cancer [facility]) 2015    Patient Active Problem List   Diagnosis Date Noted  . Lung cancer ([place]) 08/21/2014  . Current tobacco use 05/26/2014  . CALCANEAL FRACTURE 01/21/2008  . ANKLE SPRAIN, RIGHT 01/21/2008    Past Surgical History  Procedure Laterality Date  . Orif femur fracture Right 2010  . Hand surgery Right 2010    ORIF of right index finder, 2nd carpometacarpal joint dislocation right hand, proximal right humerus  . Amputation of index finger  2010    Current Outpatient Rx  Name  Route  Sig  Dispense  Refill  . albuterol (PROVENTIL HFA;VENTOLIN HFA) 108 (90 Base) MCG/ACT inhaler   Inhalation   Inhale 2 puffs into the lungs every 6 (six) hours as needed for wheezing or shortness of breath.   1 Inhaler   2   . Aspirin-Salicylamide-Caffeine (BC HEADACHE POWDER PO)   Oral   Take by mouth as needed (I take goodies BC powder intermittently.). Reported on 07/07/2015         . cephALEXin (KEFLEX) 500 MG capsule   Oral   Take 1 capsule (500 mg total) by mouth 3 (three) times daily.   21 capsule   0   . HYDROcodone-acetaminophen (NORCO) 5-325 MG tablet   Oral   Take 1 tablet by mouth every 6 (six) hours as needed for severe pain.   12 tablet   0   . Oxycodone HCl 10 MG TABS      Take 1 once every 8-12 hours as needed for pain.   60 tablet   0   . predniSONE (DELTASONE) 20 MG tablet      Take 3 pills once a day with food for 7 days; and then one pill once a day with food.   30 tablet   0     Allergies Review of patient's allergies indicates no known allergies.  No family history on file.  Social History Social History  Substance Use Topics  . Smoking status: Current Every Day Smoker -- 2.00 packs/day for 20 years    Types: Cigarettes  . Smokeless tobacco: Never Used  . Alcohol Use: 0.0 oz/week    0 Standard drinks or equivalent per week     Review of Systems  Constitutional: No fatigue Cardiovascular: No chest pain. Respiratory: No shortness of breath.  Musculoskeletal: Positive left middle finger pain. Skin: Has a laceration, redness, swelling left middle finger. Negative for rash. Neurological: Negative for headaches, focal weakness or numbness. No tingling 10-point ROS otherwise negative.  ____________________________________________  PHYSICAL EXAM:  VITAL SIGNS: ED Triage Vitals  Enc Vitals Group     BP 07/31/15 1123 113/66 mmHg     Pulse Rate 07/31/15 1123 59     Resp 07/31/15 1123 20     Temp 07/31/15 1123 98.3 F (36.8 C)     Temp Source 07/31/15 1123 Oral     SpO2 07/31/15 1123 98 %     Weight 07/31/15 1114 120 lb (54.432 kg)     Height 07/31/15 1114 '5\' 9"'$  (1.753 m)     Head Cir --      Peak Flow --      Pain Score 07/31/15 1114 10     Pain Loc --      Pain Edu? --      Excl. in [place]? --      Constitutional: Alert and oriented. Well appearing and in no acute distress. Eyes: Conjunctivae are normal.  Head: Atraumatic. Cardiovascular:   Good peripheral circulation with 2+ pulses noted in the left upper extremity. Capillary refill  less than 3 seconds in all digits of the left hand. Respiratory: Normal respiratory effort without tachypnea or retractions.  Musculoskeletal:  Pain to palpation about the complete left middle finger. PIP is bent at a 90 angle. Diffuse swelling is noted through the left middle finger. Full range of motion passively of the left middle finger with moderate pain at the PIP and DIP. No laxity of the MCP, DIP, PIP of the left middle finger. No pain to palpation about the left ring finger and has full range of motion without pain. Neurologic:  Normal speech and language. No gross focal neurologic deficits are appreciated. Sensation grossly intact about the left fingers, hand, wrist. Skin:  Superficial laceration noted to the pad of the left middle finger measuring approximately 0.5cm. no surrounding erythema or pain to palpation. Skin is warm, dry. No rash noted. Psychiatric: Mood and affect are normal. Speech and behavior are normal. Patient exhibits appropriate insight and judgement.   ____________________________________________   LABS  None ____________________________________________  EKG  None ____________________________________________  RADIOLOGY I have personally viewed and evaluated these images (plain radiographs) as part of my medical decision making, as well as reviewing the written report by the radiologist.  Dg Finger Middle Left  07/31/2015  CLINICAL DATA:  Slammed left fingers inner door 2 days ago, pain in middle finger EXAM: LEFT MIDDLE FINGER 2+V COMPARISON:  None. FINDINGS: Oblique fracture third distal phalanx. Fracture involves the distal tip of the phalanx with minimal displacement. There is also minimal deformity of the dorsal base of the distal phalanx. Defect in the tip of the fourth distal phalanx is also noted which demonstrates a more chronic appearance. IMPRESSION: Minimally displaced fracture third distal phalanx, presumably acute. Correlate with symptoms for fourth  finger. Defect distal phalanx may reflect the sequela of a prior fracture unless there is currently pain to suggest acute fracture of the fourth finger as well. Electronically Signed   By: Skipper Cliche M.D.   On: 07/31/2015 12:58    ____________________________________________    PROCEDURES  Procedure(s) performed: None    Medications  HYDROcodone-acetaminophen (NORCO/VICODIN) 5-325 MG per tablet 1 tablet (1 tablet Oral Given 07/31/15 1349)     ____________________________________________   INITIAL IMPRESSION / ASSESSMENT AND PLAN / ED COURSE  Pertinent imaging results that were available during my care of the patient were reviewed by me and considered in my medical decision making (see chart for details).  Patient's diagnosis is consistent  with fracture of left middle finger, contusion of left middle finger and superficial laceration to left middle finger. Patient was placed in a finger splint on the left middle finger as well as the left ring finger buddy taped to the middle finger. Patient tolerated the splinting well and had good perfusion after the splint was applied. Patient will be discharged home with prescriptions for Norco and Keflex to take as directed. Patient is to follow up with Dr. Sabra Heck in orthopedics in 48 hours for recheck. Patient is given ED precautions to return to the ED for any worsening or new symptoms.      ____________________________________________  FINAL CLINICAL IMPRESSION(S) / ED DIAGNOSES  Final diagnoses:  Finger fracture, left, closed, initial encounter  Contusion of left middle finger, initial encounter  Laceration of left middle finger w/o foreign body w/o damage to nail, initial encounter      NEW MEDICATIONS STARTED DURING THIS VISIT:  Discharge Medication List as of 07/31/2015  2:09 PM    START taking these medications   Details  cephALEXin (KEFLEX) 500 MG capsule Take 1 capsule (500 mg total) by mouth 3 (three) times daily.,  Starting 07/31/2015, Until Discontinued, Print    HYDROcodone-acetaminophen (NORCO) 5-325 MG tablet Take 1 tablet by mouth every 6 (six) hours as needed for severe pain., Starting 07/31/2015, Until Discontinued, [place], PA-C 07/31/15 [place], MD 07/31/15 [phone]

## 2015-07-31 NOTE — Discharge Instructions (Signed)
Cryotherapy Cryotherapy is when you put ice on your injury. Ice helps lessen pain and puffiness (swelling) after an injury. Ice works the best when you start using it in the first 24 to 48 hours after an injury. HOME CARE  Put a dry or damp towel between the ice pack and your skin.  You may press gently on the ice pack.  Leave the ice on for no more than 10 to 20 minutes at a time.  Check your skin after 5 minutes to make sure your skin is okay.  Rest at least 20 minutes between ice pack uses.  Stop using ice when your skin loses feeling (numbness).  Do not use ice on someone who cannot tell you when it hurts. This includes small children and people with memory problems (dementia). GET HELP RIGHT AWAY IF:  You have white spots on your skin.  Your skin turns blue or pale.  Your skin feels waxy or hard.  Your puffiness gets worse. MAKE SURE YOU:   Understand these instructions.  Will watch your condition.  Will get help right away if you are not doing well or get worse.   This information is not intended to replace advice given to you by your health care provider. Make sure you discuss any questions you have with your health care provider.   Document Released: 09/05/2007 Document Revised: 06/11/2011 Document Reviewed: 11/09/2010 Elsevier Interactive Patient Education 2016 [place] A contusion is a deep bruise. Contusions happen when an injury causes bleeding under the skin. Symptoms of bruising include pain, swelling, and discolored skin. The skin may turn blue, purple, or yellow. HOME CARE   Rest the injured area.  If told, put ice on the injured area.  Put ice in a plastic bag.  Place a towel between your skin and the bag.  Leave the ice on for 20 minutes, 2-3 times per day.  If told, put light pressure (compression) on the injured area using an elastic bandage. Make sure the bandage is not too tight. Remove it and put it back on as told by your  doctor.  If possible, raise (elevate) the injured area above the level of your heart while you are sitting or lying down.  Take over-the-counter and prescription medicines only as told by your doctor. GET HELP IF:  Your symptoms do not get better after several days of treatment.  Your symptoms get worse.  You have trouble moving the injured area. GET HELP RIGHT AWAY IF:   You have very bad pain.  You have a loss of feeling (numbness) in a hand or foot.  Your hand or foot turns pale or cold.   This information is not intended to replace advice given to you by your health care provider. Make sure you discuss any questions you have with your health care provider.   Document Released: 09/05/2007 Document Revised: 12/08/2014 Document Reviewed: 08/04/2014 Elsevier Interactive Patient Education 2016 [employer]  Finger Fracture Finger fractures are breaks in the bones of the fingers. There are many types of fractures. There are also different ways of treating these fractures. Your doctor will talk with you about the best way to treat your fracture. Injury is the main cause of broken fingers. This includes:  Injuries while playing sports.  Workplace injuries.  Falls. HOME CARE  Follow your doctor's instructions for:  Activities.  Exercises.  Physical therapy.  Take medicines only as told by your doctor for pain, discomfort, or fever. GET  HELP IF: You have pain or swelling that limits:  The motion of your fingers.  The use of your fingers. GET HELP RIGHT AWAY IF:  You cannot feel your fingers, or your fingers become numb.   This information is not intended to replace advice given to you by your health care provider. Make sure you discuss any questions you have with your health care provider.   Document Released: 09/05/2007 Document Revised: 04/09/2014 Document Reviewed: 10/29/2012 Elsevier Interactive Patient Education 2016 [place],  Adult A laceration is a cut that goes through all layers of the skin. The cut also goes into the tissue that is right under the skin. Some cuts heal on their own. Others need to be closed with stitches (sutures), staples, skin adhesive strips, or wound glue. Taking care of your cut lowers your risk of infection and helps your cut to heal better. HOW TO TAKE CARE OF YOUR CUT For stitches or staples:  Keep the wound clean and dry.  If you were given a bandage (dressing), you should change it at least one time per day or as told by your doctor. You should also change it if it gets wet or dirty.  Keep the wound completely dry for the first 24 hours or as told by your doctor. After that time, you may take a shower or a bath. However, make sure that the wound is not soaked in water until after the stitches or staples have been removed.  Clean the wound one time each day or as told by your doctor:  Wash the wound with soap and water.  Rinse the wound with water until all of the soap comes off.  Pat the wound dry with a clean towel. Do not rub the wound.  After you clean the wound, put a thin layer of antibiotic ointment on it as told by your doctor. This ointment:  Helps to prevent infection.  Keeps the bandage from sticking to the wound.  Have your stitches or staples removed as told by your doctor. If your doctor used skin adhesive strips:   Keep the wound clean and dry.  If you were given a bandage, you should change it at least one time per day or as told by your doctor. You should also change it if it gets dirty or wet.  Do not get the skin adhesive strips wet. You can take a shower or a bath, but be careful to keep the wound dry.  If the wound gets wet, pat it dry with a clean towel. Do not rub the wound.  Skin adhesive strips fall off on their own. You can trim the strips as the wound heals. Do not remove any strips that are still stuck to the wound. They will fall off after a  while. If your doctor used wound glue:  Try to keep your wound dry, but you may briefly wet it in the shower or bath. Do not soak the wound in water, such as by swimming.  After you take a shower or a bath, gently pat the wound dry with a clean towel. Do not rub the wound.  Do not do any activities that will make you really sweaty until the skin glue has fallen off on its own.  Do not apply liquid, cream, or ointment medicine to your wound while the skin glue is still on.  If you were given a bandage, you should change it at least one time per day or  as told by your doctor. You should also change it if it gets dirty or wet.  If a bandage is placed over the wound, do not let the tape for the bandage touch the skin glue.  Do not pick at the glue. The skin glue usually stays on for 5-10 days. Then, it falls off of the skin. General Instructions  To help prevent scarring, make sure to cover your wound with sunscreen whenever you are outside after stitches are removed, after adhesive strips are removed, or when wound glue stays in place and the wound is healed. Make sure to wear a sunscreen of at least 30 SPF.  Take over-the-counter and prescription medicines only as told by your doctor.  If you were given antibiotic medicine or ointment, take or apply it as told by your doctor. Do not stop using the antibiotic even if your wound is getting better.  Do not scratch or pick at the wound.  Keep all follow-up visits as told by your doctor. This is important.  Check your wound every day for signs of infection. Watch for:  Redness, swelling, or pain.  Fluid, blood, or pus.  Raise (elevate) the injured area above the level of your heart while you are sitting or lying down, if possible. GET HELP IF:  You got a tetanus shot and you have any of these problems at the injection site:  Swelling.  Very bad pain.  Redness.  Bleeding.  You have a fever.  A wound that was closed breaks  open.  You notice a bad smell coming from your wound or your bandage.  You notice something coming out of the wound, such as wood or glass.  Medicine does not help your pain.  You have more redness, swelling, or pain at the site of your wound.  You have fluid, blood, or pus coming from your wound.  You notice a change in the color of your skin near your wound.  You need to change the bandage often because fluid, blood, or pus is coming from the wound.  You start to have a new rash.  You start to have numbness around the wound. GET HELP RIGHT AWAY IF:  You have very bad swelling around the wound.  Your pain suddenly gets worse and is very bad.  You notice painful lumps near the wound or on skin that is anywhere on your body.  You have a red streak going away from your wound.  The wound is on your hand or foot and you cannot move a finger or toe like you usually can.  The wound is on your hand or foot and you notice that your fingers or toes look pale or bluish.   This information is not intended to replace advice given to you by your health care provider. Make sure you discuss any questions you have with your health care provider.   Document Released: 09/05/2007 Document Revised: 08/03/2014 Document Reviewed: 03/15/2014 Elsevier Interactive Patient Education [employer].

## 2015-08-02 ENCOUNTER — Telehealth: Payer: Self-pay | Admitting: *Deleted

## 2015-08-02 NOTE — Telephone Encounter (Signed)
Patient has 2 refills on inhaler

## 2015-08-02 NOTE — Telephone Encounter (Signed)
-----   Message from [place] sent at 08/02/2015 11:08 AM EDT ----- Regarding: refill Patient stated he needs refill called into [facility] for his inhaler/steroid.

## 2015-08-05 ENCOUNTER — Inpatient Hospital Stay: Payer: BLUE CROSS/BLUE SHIELD | Attending: Internal Medicine

## 2015-08-05 ENCOUNTER — Inpatient Hospital Stay: Payer: BLUE CROSS/BLUE SHIELD

## 2015-08-05 ENCOUNTER — Inpatient Hospital Stay (HOSPITAL_BASED_OUTPATIENT_CLINIC_OR_DEPARTMENT_OTHER): Payer: BLUE CROSS/BLUE SHIELD | Admitting: Internal Medicine

## 2015-08-05 VITALS — BP 121/64 | HR 76 | Temp 97.7°F | Resp 18 | Wt 116.0 lb

## 2015-08-05 DIAGNOSIS — C342 Malignant neoplasm of middle lobe, bronchus or lung: Secondary | ICD-10-CM

## 2015-08-05 DIAGNOSIS — B1920 Unspecified viral hepatitis C without hepatic coma: Secondary | ICD-10-CM | POA: Diagnosis not present

## 2015-08-05 DIAGNOSIS — Z87442 Personal history of urinary calculi: Secondary | ICD-10-CM

## 2015-08-05 DIAGNOSIS — M129 Arthropathy, unspecified: Secondary | ICD-10-CM | POA: Insufficient documentation

## 2015-08-05 DIAGNOSIS — F17210 Nicotine dependence, cigarettes, uncomplicated: Secondary | ICD-10-CM | POA: Insufficient documentation

## 2015-08-05 DIAGNOSIS — C3412 Malignant neoplasm of upper lobe, left bronchus or lung: Secondary | ICD-10-CM

## 2015-08-05 DIAGNOSIS — Z923 Personal history of irradiation: Secondary | ICD-10-CM

## 2015-08-05 DIAGNOSIS — Z8781 Personal history of (healed) traumatic fracture: Secondary | ICD-10-CM

## 2015-08-05 DIAGNOSIS — R634 Abnormal weight loss: Secondary | ICD-10-CM | POA: Insufficient documentation

## 2015-08-05 DIAGNOSIS — Z9221 Personal history of antineoplastic chemotherapy: Secondary | ICD-10-CM | POA: Diagnosis not present

## 2015-08-05 DIAGNOSIS — G8929 Other chronic pain: Secondary | ICD-10-CM | POA: Insufficient documentation

## 2015-08-05 DIAGNOSIS — J189 Pneumonia, unspecified organism: Secondary | ICD-10-CM

## 2015-08-05 DIAGNOSIS — R7989 Other specified abnormal findings of blood chemistry: Secondary | ICD-10-CM

## 2015-08-05 DIAGNOSIS — Z79899 Other long term (current) drug therapy: Secondary | ICD-10-CM | POA: Diagnosis not present

## 2015-08-05 DIAGNOSIS — C3411 Malignant neoplasm of upper lobe, right bronchus or lung: Secondary | ICD-10-CM | POA: Insufficient documentation

## 2015-08-05 DIAGNOSIS — C7801 Secondary malignant neoplasm of right lung: Secondary | ICD-10-CM | POA: Insufficient documentation

## 2015-08-05 DIAGNOSIS — Z5112 Encounter for antineoplastic immunotherapy: Secondary | ICD-10-CM | POA: Diagnosis not present

## 2015-08-05 LAB — COMPREHENSIVE METABOLIC PANEL
ALBUMIN: 3.8 g/dL (ref 3.5–5.0)
ALT: 107 U/L — ABNORMAL HIGH (ref 17–63)
ANION GAP: 6 (ref 5–15)
AST: 113 U/L — ABNORMAL HIGH (ref 15–41)
Alkaline Phosphatase: 38 U/L (ref 38–126)
BILIRUBIN TOTAL: 0.7 mg/dL (ref 0.3–1.2)
BUN: 18 mg/dL (ref 6–20)
CHLORIDE: 109 mmol/L (ref 101–111)
CO2: 25 mmol/L (ref 22–32)
Calcium: 8.8 mg/dL — ABNORMAL LOW (ref 8.9–10.3)
Creatinine, Ser: 1.09 mg/dL (ref 0.61–1.24)
GFR calc Af Amer: >60 mL/min (ref >60)
GFR calc non Af Amer: >60 mL/min (ref >60)
GLUCOSE: 129 mg/dL — AB (ref 65–99)
POTASSIUM: 4.1 mmol/L (ref 3.5–5.1)
SODIUM: 140 mmol/L (ref 135–145)
TOTAL PROTEIN: 7.9 g/dL (ref 6.5–8.1)

## 2015-08-05 LAB — CBC WITH DIFFERENTIAL/PLATELET
BASOS ABS: 0 10*3/uL (ref 0–0.1)
BASOS PCT: 0 %
EOS ABS: 0 10*3/uL (ref 0–0.7)
EOS PCT: 0 %
HCT: 35.8 % — ABNORMAL LOW (ref 40.0–52.0)
Hemoglobin: 12 g/dL — ABNORMAL LOW (ref 13.0–18.0)
Lymphocytes Relative: 13 %
Lymphs Abs: 0.6 10*3/uL — ABNORMAL LOW (ref 1.0–3.6)
MCH: 29.7 pg (ref 26.0–34.0)
MCHC: 33.6 g/dL (ref 32.0–36.0)
MCV: 88.3 fL (ref 80.0–100.0)
MONO ABS: 0.1 10*3/uL — AB (ref 0.2–1.0)
MONOS PCT: 3 %
Neutro Abs: 4.2 10*3/uL (ref 1.4–6.5)
Neutrophils Relative %: 84 %
PLATELETS: 223 10*3/uL (ref 150–440)
RBC: 4.06 MIL/uL — ABNORMAL LOW (ref 4.40–5.90)
RDW: 14.3 % (ref 11.5–14.5)
WBC: 5 10*3/uL (ref 3.8–10.6)

## 2015-08-05 MED ORDER — SODIUM CHLORIDE 0.9% FLUSH
10.0000 mL | INTRAVENOUS | Status: DC | PRN
  Administered 2015-08-05: 10 mL via INTRAVENOUS
  Filled 2015-08-05: qty 10

## 2015-08-05 MED ORDER — SODIUM CHLORIDE 0.9 % IV SOLN
Freq: Once | INTRAVENOUS | Status: AC
  Administered 2015-08-05: 12:00:00 via INTRAVENOUS
  Filled 2015-08-05: qty 1000

## 2015-08-05 MED ORDER — HEPARIN SOD (PORK) LOCK FLUSH 100 UNIT/ML IV SOLN
500.0000 [IU] | Freq: Once | INTRAVENOUS | Status: AC
  Administered 2015-08-05: 500 [IU] via INTRAVENOUS
  Filled 2015-08-05: qty 5

## 2015-08-05 MED ORDER — OXYCODONE HCL 10 MG PO TABS: ORAL_TABLET | ORAL | Status: DC

## 2015-08-05 MED ORDER — SODIUM CHLORIDE 0.9 % IV SOLN
1200.0000 mg | Freq: Once | INTRAVENOUS | Status: AC
  Administered 2015-08-05: 1200 mg via INTRAVENOUS
  Filled 2015-08-05: qty 20

## 2015-08-05 NOTE — Progress Notes (Signed)
.[facility] OFFICE PROGRESS NOTE  Patient Care Team: Tracie Harrier, MD as PCP - General (Internal Medicine) Mohammed Kindle, MD as Attending Physician (Pain Medicine)   SUMMARY OF ONCOLOGIC HISTORY:  # MARCH 2016- LUNG CA- RUL- ADENO CA [mol testing-NA sec to poor samples] STAGE III s/p chemo-RT; [SEP 2016-CT Bx for mol testing; neg for malignancy]; CT-29th  NOV 2016-  Stable/ improved right upper lobe lesion;   # RECURRENT- CT JAN 2016- Stable right upper lobe lung nodule ~ 2.5cm; but progressive RML ~1.8cm; Start Tecentriq q 3W x3 ; March 30th CT 2017- Improved Lung Lesions; RML- 1.6x1.0; RUL ~3cm.   # Hepatitis C- follow up [facility]?   # Chronic pain- on oxycodone.jan 2017-Bone scan- neg.     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  Patient has been on prednisone for 2 weeks- noted to have improvement of his mild shortness of breath and cough. Appetite is improved. No nausea no vomiting or diarrhea.   He complains of pain in his back for a few days after infusion.  Denies any headaches vision changes or double vision.  Denies any nausea vomiting.  No skin rash.  REVIEW OF SYSTEMS:  No headaches or vision changes. No tingling and numbness.  PAST MEDICAL HISTORY :  Past Medical History  Diagnosis Date  . Arthritis   . Kidney stones   . Chicken pox   . Lung cancer ([place]) 2015    PAST SURGICAL HISTORY :   Past Surgical History  Procedure Laterality Date  . Orif femur fracture Right 2010  . Hand surgery Right 2010    ORIF of right index finder, 2nd carpometacarpal joint dislocation right hand, proximal right humerus  . Amputation of index finger  2010    FAMILY HISTORY :  No family history on file.  SOCIAL HISTORY:   Social History  Substance Use Topics  . Smoking  status: Current Every Day Smoker -- 2.00 packs/day for 20 years    Types: Cigarettes  . Smokeless tobacco: Never Used  . Alcohol Use: 0.0 oz/week    0 Standard drinks or equivalent per week    ALLERGIES:  has No Known Allergies.  MEDICATIONS:  Current Outpatient Prescriptions  Medication Sig Dispense Refill  . albuterol (PROVENTIL HFA;VENTOLIN HFA) 108 (90 Base) MCG/ACT inhaler Inhale 2 puffs into the lungs every 6 (six) hours as needed for wheezing or shortness of breath. 1 Inhaler 2  . Aspirin-Salicylamide-Caffeine (BC HEADACHE POWDER PO) Take by mouth as needed (I take goodies BC powder intermittently.). Reported on 07/07/2015    . cephALEXin (KEFLEX) 500 MG capsule Take 1 capsule (500 mg total) by mouth 3 (three) times daily. 21 capsule 0  . HYDROcodone-acetaminophen (NORCO) 5-325 MG tablet Take 1 tablet by mouth every 6 (six) hours as needed for severe pain. 12 tablet 0  . Oxycodone HCl 10 MG TABS Take 1 once every 8-12 hours as needed for pain. 60 tablet 0  . predniSONE (DELTASONE) 20 MG tablet Take 3 pills once a day with food for 7 days; and then one pill once a day with food. 30 tablet 0   No current facility-administered medications for this visit.   Facility-Administered Medications Ordered in Other Visits  Medication Dose Route Frequency Provider Last Rate Last Dose  . heparin lock  flush 100 unit/mL  500 Units Intravenous Once Cammie Sickle, MD      . sodium chloride flush (NS) 0.9 % injection 10 mL  10 mL Intracatheter PRN Cammie Sickle, MD   10 mL at 05/26/15 1300  . sodium chloride flush (NS) 0.9 % injection 10 mL  10 mL Intravenous PRN Cammie Sickle, MD   10 mL at 07/07/15 1425  . sodium chloride flush (NS) 0.9 % injection 10 mL  10 mL Intravenous PRN Cammie Sickle, MD   10 mL at 08/05/15 1104    PHYSICAL EXAMINATION: ECOG PERFORMANCE STATUS: 0 - Asymptomatic  BP 121/64 mmHg  Pulse 76  Temp(Src) 97.7 F (36.5 C) (Tympanic)  Resp 18  Wt  115 lb 15.4 oz (52.6 kg)  Filed Weights   08/05/15 1120  Weight: 115 lb 15.4 oz (52.6 kg)    GENERAL:  Thin built/ moderately nourished; Alert, no distress and comfortable. He is alone.  EYES: no pallor or icterus OROPHARYNX: no thrush or ulceration; poor dentition  NECK: supple, no masses felt LYMPH:  no palpable lymphadenopathy in the cervical, axillary or inguinal regions LUNGS:  Decreased breath sounds bilaterally to auscultation. No wheeze or crackles HEART/CVS: regular rate & rhythm and no murmurs; No lower extremity edema ABDOMEN:abdomen soft, non-tender and normal bowel sounds Musculoskeletal:no cyanosis of digits and no clubbing  PSYCH: alert & oriented x 3 with fluent speech NEURO: no focal motor/sensory deficits SKIN:  no rashes or significant lesions  LABORATORY DATA:  I have reviewed the data as listed    Component Value Date/Time   NA 137 07/07/2015 1423   NA 136 07/27/2014 0856   K 4.2 07/07/2015 1423   K 3.9 07/27/2014 0856   CL 107 07/07/2015 1423   CL 104 07/27/2014 0856   CO2 27 07/07/2015 1423   CO2 27 07/27/2014 0856   GLUCOSE 104* 07/07/2015 1423   GLUCOSE 151* 07/27/2014 0856   BUN 17 07/07/2015 1423   BUN 10 07/27/2014 0856   CREATININE 0.97 07/07/2015 1423   CREATININE 0.86 07/27/2014 0856   CREATININE 0.77 07/06/2014   CALCIUM 8.7* 07/07/2015 1423   CALCIUM 8.6* 07/27/2014 0856   PROT 7.8 07/07/2015 1423   PROT 8.8* 06/28/2014 1512   ALBUMIN 3.3* 07/07/2015 1423   ALBUMIN 4.0 06/28/2014 1512   AST 50* 07/07/2015 1423   AST 37 06/28/2014 1512   ALT 57 07/07/2015 1423   ALT 43 06/28/2014 1512   ALKPHOS 39 07/07/2015 1423   ALKPHOS 53 06/28/2014 1512   BILITOT 0.4 07/07/2015 1423   BILITOT 0.3 06/28/2014 1512   GFRNONAA >60 07/07/2015 1423   GFRNONAA >60 07/27/2014 0856   GFRAA >60 07/07/2015 1423   GFRAA >60 07/27/2014 0856    No results found for: SPEP, UPEP  Lab Results  Component Value Date   WBC 5.0 08/05/2015   NEUTROABS 4.2  08/05/2015   HGB 12.0* 08/05/2015   HCT 35.8* 08/05/2015   MCV 88.3 08/05/2015   PLT 223 08/05/2015      Chemistry      Component Value Date/Time   NA 137 07/07/2015 1423   NA 136 07/27/2014 0856   K 4.2 07/07/2015 1423   K 3.9 07/27/2014 0856   CL 107 07/07/2015 1423   CL 104 07/27/2014 0856   CO2 27 07/07/2015 1423   CO2 27 07/27/2014 0856   BUN 17 07/07/2015 1423   BUN 10 07/27/2014 0856   CREATININE 0.97 07/07/2015 1423  CREATININE 0.86 07/27/2014 0856   CREATININE 0.77 07/06/2014      Component Value Date/Time   CALCIUM 8.7* 07/07/2015 1423   CALCIUM 8.6* 07/27/2014 0856   ALKPHOS 39 07/07/2015 1423   ALKPHOS 53 06/28/2014 1512   AST 50* 07/07/2015 1423   AST 37 06/28/2014 1512   ALT 57 07/07/2015 1423   ALT 43 06/28/2014 1512   BILITOT 0.4 07/07/2015 1423   BILITOT 0.3 06/28/2014 1512     Lungs/Pleura: Previously noted right upper lobe pulmonary nodule abutting the major fissure has decreased in size, currently measuring 1.6 x 1.0 cm (axial image 22 of series 3), previously 1.3 x 1.8 cm on 04/27/2015. Previously noted pleural-based mass in the periphery of the right upper lobe near the apex appears slightly less bulky than the prior examination, currently a more sessile appearing 3.1 x 1.4 cm mass-like opacity (image 14 of series 3). Today's examination demonstrates extensive new areas of ground-glass attenuation nodularity throughout the lungs bilaterally, typically in a peribronchovascular distribution. This is most extensive in the lower lungs. Mild diffuse bronchial wall thickening with mild centrilobular and moderate paraseptal emphysema. No pleural effusions.  ASSESSMENT & PLAN:   # ADENO CA LUNG CA RUL- RECURRENCE- Based on CT January 2017- shows stable right upper lobe lesion; progressive right middle lobe lesion 1.8 cm.  S/p 3 cycles of Tecentriq. CT April 2017- slight improvement of the both lesions  # Proceed with #4 today. CBC within normal  limits. CMP slightly elevated LFTs [C discussion below]  # CT April 2017- Given the possible  mild symptomts/ pneumonitis-like changes on the restaging CT scan. Status post prednisone for the 2 weeks.Diiscussed the importance of informing us if patient has worsening shortness of breath in the next few weeks.  # AST ALT 2-3 times elevated. Patient admits to drinking alcohol over the weekend.    # weight loss- stable.  Recommend ensure/boost.  # Active smoker-  Patient seems not interested in quitting smoking.as  #  Chronic pain- unclear etiology. likely unrelated to malignancy.  Bone scan was negative.  Prescription for oxycodone 10 mg every 8-12 hours was given. Patient has appointment with pain clinic on May 18.   # Patient will see me back in 3 weeks/labs/infusion;     Cammie Sickle, MD 08/05/2015 11:33 AM

## 2015-08-09 ENCOUNTER — Ambulatory Visit: Payer: BLUE CROSS/BLUE SHIELD | Admitting: Pain Medicine

## 2015-08-18 ENCOUNTER — Ambulatory Visit: Payer: BLUE CROSS/BLUE SHIELD | Admitting: Pain Medicine

## 2015-08-26 ENCOUNTER — Inpatient Hospital Stay: Payer: BLUE CROSS/BLUE SHIELD

## 2015-08-26 ENCOUNTER — Inpatient Hospital Stay (HOSPITAL_BASED_OUTPATIENT_CLINIC_OR_DEPARTMENT_OTHER): Payer: BLUE CROSS/BLUE SHIELD | Admitting: Internal Medicine

## 2015-08-26 ENCOUNTER — Other Ambulatory Visit: Payer: Self-pay | Admitting: Internal Medicine

## 2015-08-26 VITALS — BP 112/63 | HR 72 | Temp 97.6°F | Resp 18 | Wt 114.9 lb

## 2015-08-26 DIAGNOSIS — B1920 Unspecified viral hepatitis C without hepatic coma: Secondary | ICD-10-CM

## 2015-08-26 DIAGNOSIS — R7989 Other specified abnormal findings of blood chemistry: Secondary | ICD-10-CM | POA: Diagnosis not present

## 2015-08-26 DIAGNOSIS — C342 Malignant neoplasm of middle lobe, bronchus or lung: Secondary | ICD-10-CM

## 2015-08-26 DIAGNOSIS — M129 Arthropathy, unspecified: Secondary | ICD-10-CM

## 2015-08-26 DIAGNOSIS — Z923 Personal history of irradiation: Secondary | ICD-10-CM

## 2015-08-26 DIAGNOSIS — J189 Pneumonia, unspecified organism: Secondary | ICD-10-CM

## 2015-08-26 DIAGNOSIS — C3411 Malignant neoplasm of upper lobe, right bronchus or lung: Secondary | ICD-10-CM | POA: Diagnosis not present

## 2015-08-26 DIAGNOSIS — C3412 Malignant neoplasm of upper lobe, left bronchus or lung: Secondary | ICD-10-CM

## 2015-08-26 DIAGNOSIS — Z8781 Personal history of (healed) traumatic fracture: Secondary | ICD-10-CM

## 2015-08-26 DIAGNOSIS — Z79899 Other long term (current) drug therapy: Secondary | ICD-10-CM

## 2015-08-26 DIAGNOSIS — Z87442 Personal history of urinary calculi: Secondary | ICD-10-CM

## 2015-08-26 DIAGNOSIS — Z9221 Personal history of antineoplastic chemotherapy: Secondary | ICD-10-CM

## 2015-08-26 DIAGNOSIS — R634 Abnormal weight loss: Secondary | ICD-10-CM | POA: Diagnosis not present

## 2015-08-26 DIAGNOSIS — G8929 Other chronic pain: Secondary | ICD-10-CM

## 2015-08-26 DIAGNOSIS — C7801 Secondary malignant neoplasm of right lung: Secondary | ICD-10-CM

## 2015-08-26 DIAGNOSIS — R945 Abnormal results of liver function studies: Secondary | ICD-10-CM

## 2015-08-26 DIAGNOSIS — F17210 Nicotine dependence, cigarettes, uncomplicated: Secondary | ICD-10-CM

## 2015-08-26 LAB — COMPREHENSIVE METABOLIC PANEL
ALT: 310 U/L — AB (ref 17–63)
AST: 318 U/L — ABNORMAL HIGH (ref 15–41)
Albumin: 3.6 g/dL (ref 3.5–5.0)
Alkaline Phosphatase: 50 U/L (ref 38–126)
Anion gap: 6 (ref 5–15)
BUN: 20 mg/dL (ref 6–20)
CALCIUM: 8.8 mg/dL — AB (ref 8.9–10.3)
CO2: 25 mmol/L (ref 22–32)
CREATININE: 1.14 mg/dL (ref 0.61–1.24)
Chloride: 107 mmol/L (ref 101–111)
Glucose, Bld: 137 mg/dL — ABNORMAL HIGH (ref 65–99)
Potassium: 3.4 mmol/L — ABNORMAL LOW (ref 3.5–5.1)
SODIUM: 138 mmol/L (ref 135–145)
Total Bilirubin: 0.8 mg/dL (ref 0.3–1.2)
Total Protein: 7.3 g/dL (ref 6.5–8.1)

## 2015-08-26 LAB — CBC WITH DIFFERENTIAL/PLATELET
BASOS ABS: 0 10*3/uL (ref 0–0.1)
BASOS PCT: 1 %
Eosinophils Absolute: 0.1 10*3/uL (ref 0–0.7)
Eosinophils Relative: 1 %
HCT: 37.1 % — ABNORMAL LOW (ref 40.0–52.0)
Hemoglobin: 12.5 g/dL — ABNORMAL LOW (ref 13.0–18.0)
Lymphocytes Relative: 44 %
Lymphs Abs: 2.8 10*3/uL (ref 1.0–3.6)
MCH: 29.9 pg (ref 26.0–34.0)
MCHC: 33.8 g/dL (ref 32.0–36.0)
MCV: 88.4 fL (ref 80.0–100.0)
Monocytes Absolute: 0.6 10*3/uL (ref 0.2–1.0)
Monocytes Relative: 9 %
NEUTROS ABS: 2.9 10*3/uL (ref 1.4–6.5)
NEUTROS PCT: 45 %
PLATELETS: 176 10*3/uL (ref 150–440)
RBC: 4.19 MIL/uL — AB (ref 4.40–5.90)
RDW: 13.7 % (ref 11.5–14.5)
WBC: 6.4 10*3/uL (ref 3.8–10.6)

## 2015-08-26 MED ORDER — HEPARIN SOD (PORK) LOCK FLUSH 100 UNIT/ML IV SOLN
500.0000 [IU] | Freq: Once | INTRAVENOUS | Status: AC
  Administered 2015-08-26: 500 [IU] via INTRAVENOUS
  Filled 2015-08-26: qty 5

## 2015-08-26 MED ORDER — SODIUM CHLORIDE 0.9% FLUSH
10.0000 mL | INTRAVENOUS | Status: DC | PRN
  Administered 2015-08-26: 10 mL via INTRAVENOUS
  Filled 2015-08-26: qty 10

## 2015-08-26 NOTE — Progress Notes (Signed)
.[facility] OFFICE PROGRESS NOTE  Patient Care Team: Tracie Harrier, MD as PCP - General (Internal Medicine) Mohammed Kindle, MD as Attending Physician (Pain Medicine)   SUMMARY OF ONCOLOGIC HISTORY:  # MARCH 2016- LUNG CA- RUL- ADENO CA [mol testing-NA sec to poor samples] STAGE III s/p chemo-RT; [SEP 2016-CT Bx for mol testing; neg for malignancy]; CT-29th  NOV 2016-  Stable/ improved right upper lobe lesion;   # RECURRENT- CT JAN 2016- Stable right upper lobe lung nodule ~ 2.5cm; but progressive RML ~1.8cm; Start Tecentriq q 3W x3 ; March 30th CT 2017- Improved Lung Lesions; RML- 1.6x1.0; RUL ~3cm.   # Hepatitis C- follow up [facility]?   # Chronic pain- on oxycodone.jan 2017-Bone scan- neg.     INTERVAL HISTORY:  Vague historian.  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   Has chronic shortness of breath not any worse. Chronic cough not any worse. Denies abdominal pain. Denies any significant diarrhea. He admits to drinking alcohol. No nausea or vomiting.  REVIEW OF SYSTEMS:  No headaches or vision changes. No tingling and numbness.  PAST MEDICAL HISTORY :  Past Medical History  Diagnosis Date  . Arthritis   . Kidney stones   . Chicken pox   . Lung cancer ([place]) 2015    PAST SURGICAL HISTORY :   Past Surgical History  Procedure Laterality Date  . Orif femur fracture Right 2010  . Hand surgery Right 2010    ORIF of right index finder, 2nd carpometacarpal joint dislocation right hand, proximal right humerus  . Amputation of index finger  2010    FAMILY HISTORY :  No family history on file.  SOCIAL HISTORY:   Social History  Substance Use Topics  . Smoking status: Current Every Day Smoker -- 2.00 packs/day for 20 years    Types: Cigarettes  . Smokeless tobacco: Never Used  . Alcohol  Use: 0.0 oz/week    0 Standard drinks or equivalent per week    ALLERGIES:  has No Known Allergies.  MEDICATIONS:  Current Outpatient Prescriptions  Medication Sig Dispense Refill  . albuterol (PROVENTIL HFA;VENTOLIN HFA) 108 (90 Base) MCG/ACT inhaler Inhale 2 puffs into the lungs every 6 (six) hours as needed for wheezing or shortness of breath. 1 Inhaler 2  . Aspirin-Salicylamide-Caffeine (BC HEADACHE POWDER PO) Take by mouth as needed (I take goodies BC powder intermittently.). Reported on 07/07/2015    . cephALEXin (KEFLEX) 500 MG capsule Take 1 capsule (500 mg total) by mouth 3 (three) times daily. 21 capsule 0  . HYDROcodone-acetaminophen (NORCO) 5-325 MG tablet Take 1 tablet by mouth every 6 (six) hours as needed for severe pain. 12 tablet 0  . Oxycodone HCl 10 MG TABS Take 1 once every 8-12 hours as needed for pain. 60 tablet 0  . predniSONE (DELTASONE) 20 MG tablet Take 3 pills once a day with food for 7 days; and then one pill once a day with food. 30 tablet 0   No current facility-administered medications for this visit.   Facility-Administered Medications Ordered in Other Visits  Medication Dose Route Frequency Provider Last Rate Last Dose  . sodium chloride flush (NS) 0.9 % injection 10 mL  10 mL Intracatheter PRN Cammie Sickle, MD   10 mL at 05/26/15 1300  . sodium chloride flush (NS) 0.9 %  injection 10 mL  10 mL Intravenous PRN Cammie Sickle, MD   10 mL at 07/07/15 1425    PHYSICAL EXAMINATION: ECOG PERFORMANCE STATUS: 0 - Asymptomatic  BP 112/63 mmHg  Pulse 72  Temp(Src) 97.6 F (36.4 C) (Tympanic)  Resp 18  Wt 114 lb 13.8 oz (52.1 kg)  Filed Weights   08/26/15 1415  Weight: 114 lb 13.8 oz (52.1 kg)    GENERAL:  Thin built/ moderately nourished; Alert, no distress and comfortable. He is alone.  EYES: no pallor or icterus OROPHARYNX: no thrush or ulceration; poor dentition  NECK: supple, no masses felt LYMPH:  no palpable lymphadenopathy in the  cervical, axillary or inguinal regions LUNGS:  Decreased breath sounds bilaterally to auscultation. No wheeze or crackles HEART/CVS: regular rate & rhythm and no murmurs; No lower extremity edema ABDOMEN:abdomen soft, non-tender and normal bowel sounds Musculoskeletal:no cyanosis of digits and no clubbing  PSYCH: alert & oriented x 3 with fluent speech NEURO: no focal motor/sensory deficits SKIN:  no rashes or significant lesions  LABORATORY DATA:  I have reviewed the data as listed    Component Value Date/Time   NA 140 08/05/2015 1104   NA 136 07/27/2014 0856   K 4.1 08/05/2015 1104   K 3.9 07/27/2014 0856   CL 109 08/05/2015 1104   CL 104 07/27/2014 0856   CO2 25 08/05/2015 1104   CO2 27 07/27/2014 0856   GLUCOSE 129* 08/05/2015 1104   GLUCOSE 151* 07/27/2014 0856   BUN 18 08/05/2015 1104   BUN 10 07/27/2014 0856   CREATININE 1.09 08/05/2015 1104   CREATININE 0.86 07/27/2014 0856   CREATININE 0.77 07/06/2014   CALCIUM 8.8* 08/05/2015 1104   CALCIUM 8.6* 07/27/2014 0856   PROT 7.9 08/05/2015 1104   PROT 8.8* 06/28/2014 1512   ALBUMIN 3.8 08/05/2015 1104   ALBUMIN 4.0 06/28/2014 1512   AST 113* 08/05/2015 1104   AST 37 06/28/2014 1512   ALT 107* 08/05/2015 1104   ALT 43 06/28/2014 1512   ALKPHOS 38 08/05/2015 1104   ALKPHOS 53 06/28/2014 1512   BILITOT 0.7 08/05/2015 1104   BILITOT 0.3 06/28/2014 1512   GFRNONAA >60 08/05/2015 1104   GFRNONAA >60 07/27/2014 0856   GFRAA >60 08/05/2015 1104   GFRAA >60 07/27/2014 0856    No results found for: SPEP, UPEP  Lab Results  Component Value Date   WBC 6.4 08/26/2015   NEUTROABS 2.9 08/26/2015   HGB 12.5* 08/26/2015   HCT 37.1* 08/26/2015   MCV 88.4 08/26/2015   PLT 176 08/26/2015      Chemistry      Component Value Date/Time   NA 140 08/05/2015 1104   NA 136 07/27/2014 0856   K 4.1 08/05/2015 1104   K 3.9 07/27/2014 0856   CL 109 08/05/2015 1104   CL 104 07/27/2014 0856   CO2 25 08/05/2015 1104   CO2 27  07/27/2014 0856   BUN 18 08/05/2015 1104   BUN 10 07/27/2014 0856   CREATININE 1.09 08/05/2015 1104   CREATININE 0.86 07/27/2014 0856   CREATININE 0.77 07/06/2014      Component Value Date/Time   CALCIUM 8.8* 08/05/2015 1104   CALCIUM 8.6* 07/27/2014 0856   ALKPHOS 38 08/05/2015 1104   ALKPHOS 53 06/28/2014 1512   AST 113* 08/05/2015 1104   AST 37 06/28/2014 1512   ALT 107* 08/05/2015 1104   ALT 43 06/28/2014 1512   BILITOT 0.7 08/05/2015 1104   BILITOT 0.3 06/28/2014 1512  Lungs/Pleura: Previously noted right upper lobe pulmonary nodule abutting the major fissure has decreased in size, currently measuring 1.6 x 1.0 cm (axial image 22 of series 3), previously 1.3 x 1.8 cm on 04/27/2015. Previously noted pleural-based mass in the periphery of the right upper lobe near the apex appears slightly less bulky than the prior examination, currently a more sessile appearing 3.1 x 1.4 cm mass-like opacity (image 14 of series 3). Today's examination demonstrates extensive new areas of ground-glass attenuation nodularity throughout the lungs bilaterally, typically in a peribronchovascular distribution. This is most extensive in the lower lungs. Mild diffuse bronchial wall thickening with mild centrilobular and moderate paraseptal emphysema. No pleural effusions.  ASSESSMENT & PLAN:   # ADENO CA LUNG CA RUL- RECURRENCE- Based on CT January 2017- shows stable right upper lobe lesion; progressive right middle lobe lesion 1.8 cm.  S/p 3 cycles of Tecentriq. CT April 2017- slight improvement of the both lesions. Currently s/p 4 treatments.   # HOLD treatment #5 today- given the elevated LFTs [C discussion below]  # AST ALT 6-7 times elevated. Patient admits to drinking alcohol; recommend alcohol cessation. Question transaminitis related to immunotherapy.  Given the elevated LFTs recommend checking LFTs in 1 week. If not getting any better recommend starting steroids.  # Active smoker-   Patient seems not interested in quitting smoking.as  #  Chronic pain-  likely unrelated to malignancy. Patient missed an appointment with pain clinic. Recommend make an appointment as soon as possible. He will not get any further pain medications from [place].  # Patient will see me back in 3 weeks/labs/infusion;  # 25 minutes face-to-face with the patient discussing the above plan of care; more than 50% of time spent on prognosis/ natural history; counseling and coordination.       Cammie Sickle, MD 08/26/2015 2:27 PM

## 2015-08-26 NOTE — Progress Notes (Signed)
ALT: 310. AST: 318. MD, Dr. Rogue Bussing, notified via telephone. Per Dr. Rogue Bussing order: hold Tecentriq treatment today. Advised patient on refraining from alcohol use. Patient to return to clinic in one week.

## 2015-09-02 ENCOUNTER — Inpatient Hospital Stay: Payer: BLUE CROSS/BLUE SHIELD | Attending: Internal Medicine

## 2015-09-02 DIAGNOSIS — Z5111 Encounter for antineoplastic chemotherapy: Secondary | ICD-10-CM | POA: Insufficient documentation

## 2015-09-02 DIAGNOSIS — Z79899 Other long term (current) drug therapy: Secondary | ICD-10-CM | POA: Insufficient documentation

## 2015-09-02 DIAGNOSIS — Z8781 Personal history of (healed) traumatic fracture: Secondary | ICD-10-CM | POA: Insufficient documentation

## 2015-09-02 DIAGNOSIS — R0602 Shortness of breath: Secondary | ICD-10-CM | POA: Insufficient documentation

## 2015-09-02 DIAGNOSIS — M129 Arthropathy, unspecified: Secondary | ICD-10-CM | POA: Insufficient documentation

## 2015-09-02 DIAGNOSIS — C3411 Malignant neoplasm of upper lobe, right bronchus or lung: Secondary | ICD-10-CM | POA: Insufficient documentation

## 2015-09-02 DIAGNOSIS — R7989 Other specified abnormal findings of blood chemistry: Secondary | ICD-10-CM | POA: Insufficient documentation

## 2015-09-02 DIAGNOSIS — B1920 Unspecified viral hepatitis C without hepatic coma: Secondary | ICD-10-CM | POA: Insufficient documentation

## 2015-09-02 DIAGNOSIS — Z87442 Personal history of urinary calculi: Secondary | ICD-10-CM | POA: Insufficient documentation

## 2015-09-02 DIAGNOSIS — F17210 Nicotine dependence, cigarettes, uncomplicated: Secondary | ICD-10-CM | POA: Insufficient documentation

## 2015-09-02 DIAGNOSIS — G8929 Other chronic pain: Secondary | ICD-10-CM | POA: Insufficient documentation

## 2015-09-09 ENCOUNTER — Ambulatory Visit: Payer: BLUE CROSS/BLUE SHIELD | Admitting: Internal Medicine

## 2015-09-09 ENCOUNTER — Other Ambulatory Visit: Payer: BLUE CROSS/BLUE SHIELD

## 2015-09-09 ENCOUNTER — Ambulatory Visit: Payer: BLUE CROSS/BLUE SHIELD

## 2015-09-16 ENCOUNTER — Inpatient Hospital Stay: Payer: BLUE CROSS/BLUE SHIELD | Admitting: Internal Medicine

## 2015-09-16 ENCOUNTER — Inpatient Hospital Stay: Payer: BLUE CROSS/BLUE SHIELD

## 2015-09-19 ENCOUNTER — Inpatient Hospital Stay: Payer: BLUE CROSS/BLUE SHIELD

## 2015-09-19 ENCOUNTER — Inpatient Hospital Stay (HOSPITAL_BASED_OUTPATIENT_CLINIC_OR_DEPARTMENT_OTHER): Payer: BLUE CROSS/BLUE SHIELD | Admitting: Internal Medicine

## 2015-09-19 ENCOUNTER — Encounter: Payer: Self-pay | Admitting: Pain Medicine

## 2015-09-19 ENCOUNTER — Ambulatory Visit: Payer: BLUE CROSS/BLUE SHIELD | Attending: Pain Medicine | Admitting: Pain Medicine

## 2015-09-19 VITALS — BP 108/54 | HR 68 | Temp 98.2°F | Resp 14 | Ht 69.0 in | Wt 122.0 lb

## 2015-09-19 VITALS — BP 126/79 | HR 73 | Temp 95.4°F | Resp 18 | Wt 117.9 lb

## 2015-09-19 DIAGNOSIS — M129 Arthropathy, unspecified: Secondary | ICD-10-CM

## 2015-09-19 DIAGNOSIS — M533 Sacrococcygeal disorders, not elsewhere classified: Secondary | ICD-10-CM | POA: Insufficient documentation

## 2015-09-19 DIAGNOSIS — X58XXXA Exposure to other specified factors, initial encounter: Secondary | ICD-10-CM | POA: Insufficient documentation

## 2015-09-19 DIAGNOSIS — G8929 Other chronic pain: Secondary | ICD-10-CM

## 2015-09-19 DIAGNOSIS — R945 Abnormal results of liver function studies: Principal | ICD-10-CM

## 2015-09-19 DIAGNOSIS — Z87442 Personal history of urinary calculi: Secondary | ICD-10-CM | POA: Insufficient documentation

## 2015-09-19 DIAGNOSIS — M1651 Unilateral post-traumatic osteoarthritis, right hip: Secondary | ICD-10-CM | POA: Diagnosis not present

## 2015-09-19 DIAGNOSIS — F17210 Nicotine dependence, cigarettes, uncomplicated: Secondary | ICD-10-CM

## 2015-09-19 DIAGNOSIS — C342 Malignant neoplasm of middle lobe, bronchus or lung: Secondary | ICD-10-CM

## 2015-09-19 DIAGNOSIS — R0602 Shortness of breath: Secondary | ICD-10-CM | POA: Diagnosis not present

## 2015-09-19 DIAGNOSIS — R7989 Other specified abnormal findings of blood chemistry: Secondary | ICD-10-CM

## 2015-09-19 DIAGNOSIS — K219 Gastro-esophageal reflux disease without esophagitis: Secondary | ICD-10-CM | POA: Diagnosis not present

## 2015-09-19 DIAGNOSIS — Z5111 Encounter for antineoplastic chemotherapy: Secondary | ICD-10-CM | POA: Insufficient documentation

## 2015-09-19 DIAGNOSIS — S8290XA Unspecified fracture of unspecified lower leg, initial encounter for closed fracture: Secondary | ICD-10-CM

## 2015-09-19 DIAGNOSIS — M5136 Other intervertebral disc degeneration, lumbar region: Secondary | ICD-10-CM | POA: Diagnosis not present

## 2015-09-19 DIAGNOSIS — B1920 Unspecified viral hepatitis C without hepatic coma: Secondary | ICD-10-CM | POA: Diagnosis not present

## 2015-09-19 DIAGNOSIS — M25519 Pain in unspecified shoulder: Secondary | ICD-10-CM | POA: Diagnosis present

## 2015-09-19 DIAGNOSIS — Z79899 Other long term (current) drug therapy: Secondary | ICD-10-CM | POA: Diagnosis not present

## 2015-09-19 DIAGNOSIS — C3491 Malignant neoplasm of unspecified part of right bronchus or lung: Secondary | ICD-10-CM | POA: Insufficient documentation

## 2015-09-19 DIAGNOSIS — Z8781 Personal history of (healed) traumatic fracture: Secondary | ICD-10-CM

## 2015-09-19 DIAGNOSIS — C3411 Malignant neoplasm of upper lobe, right bronchus or lung: Secondary | ICD-10-CM | POA: Insufficient documentation

## 2015-09-19 DIAGNOSIS — C3401 Malignant neoplasm of right main bronchus: Secondary | ICD-10-CM

## 2015-09-19 DIAGNOSIS — S68110A Complete traumatic metacarpophalangeal amputation of right index finger, initial encounter: Secondary | ICD-10-CM | POA: Insufficient documentation

## 2015-09-19 DIAGNOSIS — M19111 Post-traumatic osteoarthritis, right shoulder: Secondary | ICD-10-CM

## 2015-09-19 DIAGNOSIS — S8291XA Unspecified fracture of right lower leg, initial encounter for closed fracture: Secondary | ICD-10-CM

## 2015-09-19 HISTORY — DX: Other specified abnormal findings of blood chemistry: R79.89

## 2015-09-19 HISTORY — DX: Other chronic pain: G89.29

## 2015-09-19 HISTORY — DX: Unspecified fracture of unspecified lower leg, initial encounter for closed fracture: S82.90XA

## 2015-09-19 HISTORY — DX: Malignant neoplasm of upper lobe, right bronchus or lung: C34.11

## 2015-09-19 LAB — CBC WITH DIFFERENTIAL/PLATELET
Basophils Absolute: 0.1 10*3/uL (ref 0–0.1)
Basophils Relative: 1 %
EOS PCT: 2 %
Eosinophils Absolute: 0.1 10*3/uL (ref 0–0.7)
HCT: 38 % — ABNORMAL LOW (ref 40.0–52.0)
HEMOGLOBIN: 13 g/dL (ref 13.0–18.0)
LYMPHS ABS: 2.6 10*3/uL (ref 1.0–3.6)
LYMPHS PCT: 46 %
MCH: 30.3 pg (ref 26.0–34.0)
MCHC: 34.1 g/dL (ref 32.0–36.0)
MCV: 89.1 fL (ref 80.0–100.0)
Monocytes Absolute: 1 10*3/uL (ref 0.2–1.0)
Monocytes Relative: 17 %
NEUTROS ABS: 2 10*3/uL (ref 1.4–6.5)
Neutrophils Relative %: 34 %
Platelets: 196 10*3/uL (ref 150–440)
RBC: 4.27 MIL/uL — AB (ref 4.40–5.90)
RDW: 13.6 % (ref 11.5–14.5)
WBC: 5.8 10*3/uL (ref 3.8–10.6)

## 2015-09-19 LAB — COMPREHENSIVE METABOLIC PANEL
ALK PHOS: 46 U/L (ref 38–126)
ALT: 51 U/L (ref 17–63)
ANION GAP: 4 — AB (ref 5–15)
AST: 43 U/L — ABNORMAL HIGH (ref 15–41)
Albumin: 3.8 g/dL (ref 3.5–5.0)
BUN: 14 mg/dL (ref 6–20)
CALCIUM: 8.7 mg/dL — AB (ref 8.9–10.3)
CO2: 25 mmol/L (ref 22–32)
Chloride: 107 mmol/L (ref 101–111)
Creatinine, Ser: 0.87 mg/dL (ref 0.61–1.24)
GFR calc non Af Amer: >60 mL/min (ref >60)
Glucose, Bld: 101 mg/dL — ABNORMAL HIGH (ref 65–99)
POTASSIUM: 4 mmol/L (ref 3.5–5.1)
SODIUM: 136 mmol/L (ref 135–145)
TOTAL PROTEIN: 8.4 g/dL — AB (ref 6.5–8.1)
Total Bilirubin: 0.9 mg/dL (ref 0.3–1.2)

## 2015-09-19 MED ORDER — SODIUM CHLORIDE 0.9 % IV SOLN
Freq: Once | INTRAVENOUS | Status: AC
  Administered 2015-09-19: 15:00:00 via INTRAVENOUS
  Filled 2015-09-19: qty 1000

## 2015-09-19 MED ORDER — HEPARIN SOD (PORK) LOCK FLUSH 100 UNIT/ML IV SOLN
500.0000 [IU] | Freq: Once | INTRAVENOUS | Status: AC
  Administered 2015-09-19: 500 [IU] via INTRAVENOUS

## 2015-09-19 MED ORDER — HEPARIN SOD (PORK) LOCK FLUSH 100 UNIT/ML IV SOLN
INTRAVENOUS | Status: AC
  Filled 2015-09-19: qty 5

## 2015-09-19 MED ORDER — SODIUM CHLORIDE 0.9 % IV SOLN
1200.0000 mg | Freq: Once | INTRAVENOUS | Status: AC
  Administered 2015-09-19: 1200 mg via INTRAVENOUS
  Filled 2015-09-19: qty 20

## 2015-09-19 MED ORDER — SODIUM CHLORIDE 0.9% FLUSH
10.0000 mL | Freq: Once | INTRAVENOUS | Status: AC
  Administered 2015-09-19: 10 mL via INTRAVENOUS
  Filled 2015-09-19: qty 10

## 2015-09-19 NOTE — Assessment & Plan Note (Signed)
Patient's bone scans/CT scan does not reveal any metastatic lesions to the bone. The etiology of patient's chronic pain is unclear. Patient has been evaluated at pain clinic. Await recommendations. I would defer pain management to pain clinic; unless patient has evidence of metastatic disease/cancer causing pain. No prescriptions were given today.

## 2015-09-19 NOTE — Assessment & Plan Note (Signed)
LFTs within normal limits; labs-CBC CMP unremarkable. Proceed with treatment today.

## 2015-09-19 NOTE — Assessment & Plan Note (Addendum)
Recurrent adenocarcinoma lung- currently on Tecentriq; status post 3 cycles improved response. Patient is currently status post 4 cycles. Clinically no evidence of progression noted. We'll reevaluate with a CT scan next 1 to 2 cycles.

## 2015-09-19 NOTE — Progress Notes (Signed)
.[facility] OFFICE PROGRESS NOTE  Patient Care Team: Tracie Harrier, MD as PCP - General (Internal Medicine) Mohammed Kindle, MD as Attending Physician (Pain Medicine)   SUMMARY OF ONCOLOGIC HISTORY:  Oncology History   # MARCH 2016- LUNG CA- RUL- ADENO CA [mol testing-NA sec to poor samples] STAGE III s/p chemo-RT; [SEP 2016-CT Bx for mol testing; neg for malignancy]; CT-29th  NOV 2016-  Stable/ improved right upper lobe lesion;   # RECURRENT- CT JAN 2016- Stable right upper lobe lung nodule ~ 2.5cm; but progressive RML ~1.8cm; Start Tecentriq q 3W x3 ; March 30th CT 2017- Improved Lung Lesions; RML- 1.6x1.0; RUL ~3cm.   # Hepatitis C- follow up [facility]?   # Chronic pain- on oxycodone.jan 2017-Bone scan- neg.       Cancer of upper lobe of right lung ([place])   09/19/2015 Initial Diagnosis Cancer of upper lobe of right lung ([place])     INTERVAL HISTORY:  Vague historian.  55 year old male patient with above history of  Adenocarcinoma/Recurrent currently on Tecentriq q 3W is here for follow-up.  Patient's last cycle of Tecentriq was held because of elevated LFTs. He states he has not been drinking alcohol.  Patient's appetite is fair. Chronic shortness of breath. On a cough. No hemoptysis. No abdominal pain nausea vomiting.  Patient has been to the pain clinic this morning.    REVIEW OF SYSTEMS:  No headaches or vision changes. No tingling and numbness.  PAST MEDICAL HISTORY :  Past Medical History  Diagnosis Date  . Arthritis   . Kidney stones   . Chicken pox   . Lung cancer ([place]) 2015    PAST SURGICAL HISTORY :   Past Surgical History  Procedure Laterality Date  . Orif femur fracture Right 2010  . Hand surgery Right 2010    ORIF of right index finder, 2nd carpometacarpal joint dislocation right hand, proximal right humerus  . Amputation of index finger  2010  . Shoulder surgery Right 2010    pins and rods in shoulder    FAMILY HISTORY :  No family  history on file.  SOCIAL HISTORY:   Social History  Substance Use Topics  . Smoking status: Current Every Day Smoker -- 2.00 packs/day for 20 years    Types: Cigarettes  . Smokeless tobacco: Never Used  . Alcohol Use: 0.0 oz/week    0 Standard drinks or equivalent per week    ALLERGIES:  has No Known Allergies.  MEDICATIONS:  Current Outpatient Prescriptions  Medication Sig Dispense Refill  . albuterol (PROVENTIL HFA;VENTOLIN HFA) 108 (90 Base) MCG/ACT inhaler Inhale 2 puffs into the lungs every 6 (six) hours as needed for wheezing or shortness of breath. 1 Inhaler 2  . Aspirin-Salicylamide-Caffeine (BC HEADACHE POWDER PO) Take by mouth as needed (I take goodies BC powder intermittently.). Reported on 07/07/2015    . HYDROcodone-acetaminophen (NORCO) 5-325 MG tablet Take 1 tablet by mouth every 6 (six) hours as needed for severe pain. 12 tablet 0  . Oxycodone HCl 10 MG TABS Take 1 once every 8-12 hours as needed for pain. 60 tablet 0   No current facility-administered medications for this visit.   Facility-Administered Medications Ordered in Other Visits  Medication Dose Route Frequency Provider Last Rate Last Dose  . sodium chloride flush (NS) 0.9 % injection 10 mL  10 mL Intracatheter PRN Cammie Sickle, MD   10 mL at 05/26/15 1300  . sodium chloride flush (NS) 0.9 % injection 10  mL  10 mL Intravenous PRN Cammie Sickle, MD   10 mL at 07/07/15 1425    PHYSICAL EXAMINATION: ECOG PERFORMANCE STATUS: 0 - Asymptomatic  BP 126/79 mmHg  Pulse 73  Temp(Src) 95.4 F (35.2 C) (Tympanic)  Resp 18  Wt 117 lb 15.1 oz (53.5 kg)  SpO2   Filed Weights   09/19/15 1413  Weight: 117 lb 15.1 oz (53.5 kg)    GENERAL:  Thin built/ moderately nourished; Alert, no distress and comfortable. He is alone.  EYES: no pallor or icterus OROPHARYNX: no thrush or ulceration; poor dentition  NECK: supple, no masses felt LYMPH:  no palpable lymphadenopathy in the cervical, axillary or  inguinal regions LUNGS:  Decreased breath sounds bilaterally to auscultation. No wheeze or crackles HEART/CVS: regular rate & rhythm and no murmurs; No lower extremity edema ABDOMEN:abdomen soft, non-tender and normal bowel sounds Musculoskeletal:no cyanosis of digits and no clubbing  PSYCH: alert & oriented x 3 with fluent speech NEURO: no focal motor/sensory deficits SKIN:  no rashes or significant lesions  LABORATORY DATA:  I have reviewed the data as listed    Component Value Date/Time   NA 136 09/19/2015 1352   NA 136 07/27/2014 0856   K 4.0 09/19/2015 1352   K 3.9 07/27/2014 0856   CL 107 09/19/2015 1352   CL 104 07/27/2014 0856   CO2 25 09/19/2015 1352   CO2 27 07/27/2014 0856   GLUCOSE 101* 09/19/2015 1352   GLUCOSE 151* 07/27/2014 0856   BUN 14 09/19/2015 1352   BUN 10 07/27/2014 0856   CREATININE 0.87 09/19/2015 1352   CREATININE 0.86 07/27/2014 0856   CREATININE 0.77 07/06/2014   CALCIUM 8.7* 09/19/2015 1352   CALCIUM 8.6* 07/27/2014 0856   PROT 8.4* 09/19/2015 1352   PROT 8.8* 06/28/2014 1512   ALBUMIN 3.8 09/19/2015 1352   ALBUMIN 4.0 06/28/2014 1512   AST 43* 09/19/2015 1352   AST 37 06/28/2014 1512   ALT 51 09/19/2015 1352   ALT 43 06/28/2014 1512   ALKPHOS 46 09/19/2015 1352   ALKPHOS 53 06/28/2014 1512   BILITOT 0.9 09/19/2015 1352   BILITOT 0.3 06/28/2014 1512   GFRNONAA >60 09/19/2015 1352   GFRNONAA >60 07/27/2014 0856   GFRAA >60 09/19/2015 1352   GFRAA >60 07/27/2014 0856    No results found for: SPEP, UPEP  Lab Results  Component Value Date   WBC 5.8 09/19/2015   NEUTROABS 2.0 09/19/2015   HGB 13.0 09/19/2015   HCT 38.0* 09/19/2015   MCV 89.1 09/19/2015   PLT 196 09/19/2015      Chemistry      Component Value Date/Time   NA 136 09/19/2015 1352   NA 136 07/27/2014 0856   K 4.0 09/19/2015 1352   K 3.9 07/27/2014 0856   CL 107 09/19/2015 1352   CL 104 07/27/2014 0856   CO2 25 09/19/2015 1352   CO2 27 07/27/2014 0856   BUN 14  09/19/2015 1352   BUN 10 07/27/2014 0856   CREATININE 0.87 09/19/2015 1352   CREATININE 0.86 07/27/2014 0856   CREATININE 0.77 07/06/2014      Component Value Date/Time   CALCIUM 8.7* 09/19/2015 1352   CALCIUM 8.6* 07/27/2014 0856   ALKPHOS 46 09/19/2015 1352   ALKPHOS 53 06/28/2014 1512   AST 43* 09/19/2015 1352   AST 37 06/28/2014 1512   ALT 51 09/19/2015 1352   ALT 43 06/28/2014 1512   BILITOT 0.9 09/19/2015 1352   BILITOT 0.3 06/28/2014 1512  Lungs/Pleura: Previously noted right upper lobe pulmonary nodule abutting the major fissure has decreased in size, currently measuring 1.6 x 1.0 cm (axial image 22 of series 3), previously 1.3 x 1.8 cm on 04/27/2015. Previously noted pleural-based mass in the periphery of the right upper lobe near the apex appears slightly less bulky than the prior examination, currently a more sessile appearing 3.1 x 1.4 cm mass-like opacity (image 14 of series 3). Today's examination demonstrates extensive new areas of ground-glass attenuation nodularity throughout the lungs bilaterally, typically in a peribronchovascular distribution. This is most extensive in the lower lungs. Mild diffuse bronchial wall thickening with mild centrilobular and moderate paraseptal emphysema. No pleural effusions.  ASSESSMENT & PLAN:   Cancer of upper lobe of right lung ([place]) Recurrent adenocarcinoma lung- currently on Tecentriq; status post 3 cycles improved response. Patient is currently status post 4 cycles. Clinically no evidence of progression noted. We'll reevaluate with a CT scan next 1 to 2 cycles.  Encounter for antineoplastic chemotherapy LFTs within normal limits; labs-CBC CMP unremarkable. Proceed with treatment today.  Elevated LFTs 3 weeks ago LFTs were elevated up to ~300s/ question secondary to alcohol. History of hepatitis C. As likely secondary to Tecentriq. Since improved proceed with Tecentriq if this continues to be a problem than  immunotherapy has to be held/need for steroids.   Chronic pain Patient's bone scans/CT scan does not reveal any metastatic lesions to the bone. The etiology of patient's chronic pain is unclear. Patient has been evaluated at pain clinic. Await recommendations. I would defer pain management to pain clinic; unless patient has evidence of metastatic disease/cancer causing pain. No prescriptions were given today.    Patient will follow-up with me in approximately 3 Tecentriq infusion/labs.  Cammie Sickle, MD 09/19/2015 6:35 PM
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
## 2015-09-19 NOTE — Patient Instructions (Addendum)
PLAN  Continue present medications  Shoulder injection to be performed at time of return appointment  F/U PCP Dr. Ginette Pitman for evaluation of  BP and general medical  condition  F/U surgical evaluation. May consider pending follow-up evaluations  F/U neurological evaluation. May consider pending follow-up evaluations  F/U oncological evaluation with Dr.B as planned and as scheduled  May consider radiofrequency rhizolysis or intraspinal procedures pending response to present treatment and F/U evaluation   Patient to call [facility] Center should patient have concerns prior to scheduled return appointment.   Trigger Point Injection Trigger points are areas where you have muscle pain. A trigger point injection is a shot given in the trigger point to relieve that pain. A trigger point might feel like a knot in your muscle. It hurts to press on a trigger point. Sometimes the pain spreads out (radiates) to other parts of the body. For example, pressing on a trigger point in your shoulder might cause pain in your arm or neck. You might have one trigger point. Or, you might have more than one. People often have trigger points in their upper back and lower back. They also occur often in the neck and shoulders. Pain from a trigger point lasts for a long time. It can make it hard to keep moving. You might not be able to do the exercise or physical therapy that could help you deal with the pain. A trigger point injection may help. It does not work for everyone. But, it may relieve your pain for a few days or a few months. A trigger point injection does not cure long-lasting (chronic) pain. LET YOUR CAREGIVER KNOW ABOUT:  Any allergies (especially to latex, lidocaine, or steroids).  Blood-thinning medicines that you take. These drugs can lead to bleeding or bruising after an injection. They include:  Aspirin.  Ibuprofen.  Clopidogrel.  Warfarin.  Other medicines you take. This includes all  vitamins, herbs, eyedrops, over-the-counter medicines, and creams.  Use of steroids.  Recent infections.  Past problems with numbing medicines.  Bleeding problems.  Surgeries you have had.  Other health problems. RISKS AND COMPLICATIONS A trigger point injection is a safe treatment. However, problems may develop, such as:  Minor side effects usually go away in 1 to 2 days. These may include:  Soreness.  Bruising.  Stiffness.  More serious problems are rare. But, they may include:  Bleeding under the skin (hematoma).  Skin infection.  Breaking off of the needle under your skin.  Lung puncture.  The trigger point injection may not work for you. BEFORE THE PROCEDURE You may need to stop taking any medicine that thins your blood. This is to prevent bleeding and bruising. Usually these medicines are stopped several days before the injection. No other preparation is needed. PROCEDURE  A trigger point injection can be given in your caregiver's office or in a clinic. Each injection takes 2 minutes or less.  Your caregiver will feel for trigger points. The caregiver may use a marker to circle the area for the injection.  The skin over the trigger point will be washed with a germ-killing (antiseptic) solution.  The caregiver pinches the spot for the injection.  Then, a very thin needle is used for the shot. You may feel pain or a twitching feeling when the needle enters the trigger point.  A numbing solution may be injected into the trigger point. Sometimes a drug to keep down swelling, redness, and warmth (inflammation) is also injected.  Your  caregiver moves the needle around the trigger zone until the tightness and twitching goes away.  After the injection, your caregiver may put gentle pressure over the injection site.  Then it is covered with a bandage. AFTER THE PROCEDURE  You can go right home after the injection.  The bandage can be taken off after a few  hours.  You may feel sore and stiff for 1 to 2 days.  Go back to your regular activities slowly. Your caregiver may ask you to stretch your muscles. Do not do anything that takes extra energy for a few days.  Follow your caregiver's instructions to manage and treat other pain.   This information is not intended to replace advice given to you by your health care provider. Make sure you discuss any questions you have with your health care provider.   Document Released: 03/08/2011 Document Revised: 07/14/2012 Document Reviewed: 03/08/2011 Elsevier Interactive Patient Education [employer].

## 2015-09-19 NOTE — Progress Notes (Signed)
Pt came back to cancer center at 1640 requesting narcotics rx to be written for his 'painful' trigger finger'. Pt just evaluated by orthopedics per patient.  RN explained per md advice that Dr. Rogue Bussing would not prescribe any narcotics as patient will now be under the care of pain management. I explained this to the patient. Pt gave verbal understanding and stated, "well then, I will just have to taken an over the counter goodies powder."

## 2015-09-19 NOTE — Assessment & Plan Note (Signed)
3 weeks ago LFTs were elevated up to ~300s/ question secondary to alcohol. History of hepatitis C. As likely secondary to Tecentriq. Since improved proceed with Tecentriq if this continues to be a problem than immunotherapy has to be held/need for steroids.

## 2015-10-07 ENCOUNTER — Other Ambulatory Visit: Payer: Self-pay | Admitting: *Deleted

## 2015-10-07 DIAGNOSIS — C3411 Malignant neoplasm of upper lobe, right bronchus or lung: Secondary | ICD-10-CM

## 2015-10-09 ENCOUNTER — Other Ambulatory Visit: Payer: Self-pay | Admitting: Pain Medicine

## 2015-10-10 ENCOUNTER — Inpatient Hospital Stay: Payer: BLUE CROSS/BLUE SHIELD | Admitting: Internal Medicine

## 2015-10-10 ENCOUNTER — Encounter: Payer: Self-pay | Admitting: Pain Medicine

## 2015-10-10 ENCOUNTER — Inpatient Hospital Stay: Payer: BLUE CROSS/BLUE SHIELD

## 2015-10-10 ENCOUNTER — Ambulatory Visit: Payer: BLUE CROSS/BLUE SHIELD | Attending: Pain Medicine | Admitting: Pain Medicine

## 2015-10-10 VITALS — BP 129/59 | HR 54 | Temp 98.1°F | Resp 16 | Ht 69.0 in | Wt 120.0 lb

## 2015-10-10 DIAGNOSIS — M25511 Pain in right shoulder: Secondary | ICD-10-CM | POA: Insufficient documentation

## 2015-10-10 DIAGNOSIS — C3401 Malignant neoplasm of right main bronchus: Secondary | ICD-10-CM

## 2015-10-10 DIAGNOSIS — M19111 Post-traumatic osteoarthritis, right shoulder: Secondary | ICD-10-CM

## 2015-10-10 DIAGNOSIS — M19011 Primary osteoarthritis, right shoulder: Secondary | ICD-10-CM | POA: Insufficient documentation

## 2015-10-10 DIAGNOSIS — S8291XA Unspecified fracture of right lower leg, initial encounter for closed fracture: Secondary | ICD-10-CM

## 2015-10-10 MED ORDER — TRIAMCINOLONE ACETONIDE 40 MG/ML IJ SUSP
40.0000 mg | Freq: Once | INTRAMUSCULAR | Status: AC
  Administered 2015-10-10: 40 mg
  Filled 2015-10-10: qty 1

## 2015-10-10 MED ORDER — BUPIVACAINE HCL (PF) 0.25 % IJ SOLN
30.0000 mL | Freq: Once | INTRAMUSCULAR | Status: AC
  Administered 2015-10-10: 30 mL
  Filled 2015-10-10: qty 30

## 2015-10-10 NOTE — Patient Instructions (Addendum)
PLAN  Continue present medications  F/U PCP Dr. Ginette Pitman for evaluation of  BP and general medical  condition  F/U surgical evaluation. May consider pending follow-up evaluations  F/U neurological evaluation. May consider pending follow-up evaluations  F/U oncological evaluation with Dr.B as planned and as scheduled  May consider radiofrequency rhizolysis or intraspinal procedures pending response to present treatment and F/U evaluation   Patient to call Pain Management Center should patient have concerns prior to scheduled return appointment. Pain Management Discharge Instructions  General Discharge Instructions :  If you need to reach your doctor call: Monday-Friday 8:00 am - 4:00 pm at [phone] or toll free [phone].  After clinic hours [phone] to have operator reach doctor.  Bring all of your medication bottles to all your appointments in the pain clinic.  To cancel or reschedule your appointment with Pain Management please remember to call 24 hours in advance to avoid a fee.  Refer to the educational materials which you have been given on: General Risks, I had my Procedure. Discharge Instructions, Post Sedation.  Post Procedure Instructions:  The drugs you were given will stay in your system until tomorrow, so for the next 24 hours you should not drive, make any legal decisions or drink any alcoholic beverages.  You may eat anything you prefer, but it is better to start with liquids then soups and crackers, and gradually work up to solid foods.  Please notify your doctor immediately if you have any unusual bleeding, trouble breathing or pain that is not related to your normal pain.  Depending on the type of procedure that was done, some parts of your body may feel week and/or numb.  This usually clears up by tonight or the next day.  Walk with the use of an assistive device or accompanied by an adult for the 24 hours.  You may use ice on the affected area for  the first 24 hours.  Put ice in a Ziploc bag and cover with a towel and place against area 15 minutes on 15 minutes off.  You may switch to heat after 24 hours.GENERAL RISKS AND COMPLICATIONS  What are the risk, side effects and possible complications? Generally speaking, most procedures are safe.  However, with any procedure there are risks, side effects, and the possibility of complications.  The risks and complications are dependent upon the sites that are lesioned, or the type of nerve block to be performed.  The closer the procedure is to the spine, the more serious the risks are.  Great care is taken when placing the radio frequency needles, block needles or lesioning probes, but sometimes complications can occur. 1. Infection: Any time there is an injection through the skin, there is a risk of infection.  This is why sterile conditions are used for these blocks.  There are four possible types of infection. 1. Localized skin infection. 2. Central Nervous System Infection-This can be in the form of Meningitis, which can be deadly. 3. Epidural Infections-This can be in the form of an epidural abscess, which can cause pressure inside of the spine, causing compression of the spinal cord with subsequent paralysis. This would require an emergency surgery to decompress, and there are no guarantees that the patient would recover from the paralysis. 4. Discitis-This is an infection of the intervertebral discs.  It occurs in about 1% of discography procedures.  It is difficult to treat and it may lead to surgery.        2. Pain: the  needles have to go through skin and soft tissues, will cause soreness.       3. Damage to internal structures:  The nerves to be lesioned may be near blood vessels or    other nerves which can be potentially damaged.       4. Bleeding: Bleeding is more common if the patient is taking blood thinners such as  aspirin, Coumadin, Ticiid, Plavix, etc., or if he/she have some genetic  predisposition  such as hemophilia. Bleeding into the spinal canal can cause compression of the spinal  cord with subsequent paralysis.  This would require an emergency surgery to  decompress and there are no guarantees that the patient would recover from the  paralysis.       5. Pneumothorax:  Puncturing of a lung is a possibility, every time a needle is introduced in  the area of the chest or upper back.  Pneumothorax refers to free air around the  collapsed lung(s), inside of the thoracic cavity (chest cavity).  Another two possible  complications related to a similar event would include: Hemothorax and Chylothorax.   These are variations of the Pneumothorax, where instead of air around the collapsed  lung(s), you may have blood or chyle, respectively.       6. Spinal headaches: They may occur with any procedures in the area of the spine.       7. Persistent CSF (Cerebro-Spinal Fluid) leakage: This is a rare problem, but may occur  with prolonged intrathecal or epidural catheters either due to the formation of a fistulous  track or a dural tear.       8. Nerve damage: By working so close to the spinal cord, there is always a possibility of  nerve damage, which could be as serious as a permanent spinal cord injury with  paralysis.       9. Death:  Although rare, severe deadly allergic reactions known as "Anaphylactic  reaction" can occur to any of the medications used.      10. Worsening of the symptoms:  We can always make thing worse.  What are the chances of something like this happening? Chances of any of this occuring are extremely low.  By statistics, you have more of a chance of getting killed in a motor vehicle accident: while driving to the hospital than any of the above occurring .  Nevertheless, you should be aware that they are possibilities.  In general, it is similar to taking a shower.  Everybody knows that you can slip, hit your head and get killed.  Does that mean that you should not  shower again?  Nevertheless always keep in mind that statistics do not mean anything if you happen to be on the wrong side of them.  Even if a procedure has a 1 (one) in a 1,000,000 (million) chance of going wrong, it you happen to be that one..Also, keep in mind that by statistics, you have more of a chance of having something go wrong when taking medications.  Who should not have this procedure? If you are on a blood thinning medication (e.g. Coumadin, Plavix, see list of "Blood Thinners"), or if you have an active infection going on, you should not have the procedure.  If you are taking any blood thinners, please inform your physician.  How should I prepare for this procedure?  Do not eat or drink anything at least six hours prior to the procedure.  Bring a driver with you .  It cannot be a taxi.  Come accompanied by an adult that can drive you back, and that is strong enough to help you if your legs get weak or numb from the local anesthetic.  Take all of your medicines the morning of the procedure with just enough water to swallow them.  If you have diabetes, make sure that you are scheduled to have your procedure done first thing in the morning, whenever possible.  If you have diabetes, take only half of your insulin dose and notify our nurse that you have done so as soon as you arrive at the clinic.  If you are diabetic, but only take blood sugar pills (oral hypoglycemic), then do not take them on the morning of your procedure.  You may take them after you have had the procedure.  Do not take aspirin or any aspirin-containing medications, at least eleven (11) days prior to the procedure.  They may prolong bleeding.  Wear loose fitting clothing that may be easy to take off and that you would not mind if it got stained with Betadine or blood.  Do not wear any jewelry or perfume  Remove any nail coloring.  It will interfere with some of our monitoring equipment.  NOTE: Remember that  this is not meant to be interpreted as a complete list of all possible complications.  Unforeseen problems may occur.  BLOOD THINNERS The following drugs contain aspirin or other products, which can cause increased bleeding during surgery and should not be taken for 2 weeks prior to and 1 week after surgery.  If you should need take something for relief of minor pain, you may take acetaminophen which is found in Tylenol,m Datril, Anacin-3 and Panadol. It is not blood thinner. The products listed below are.  Do not take any of the products listed below in addition to any listed on your instruction sheet.  A.P.C or A.P.C with Codeine Codeine Phosphate Capsules #3 Ibuprofen Ridaura  ABC compound Congesprin Imuran rimadil  Advil Cope Indocin Robaxisal  Alka-Seltzer Effervescent Pain Reliever and Antacid Coricidin or Coricidin-D  Indomethacin Rufen  Alka-Seltzer plus Cold Medicine Cosprin Ketoprofen S-A-C Tablets  Anacin Analgesic Tablets or Capsules Coumadin Korlgesic Salflex  Anacin Extra Strength Analgesic tablets or capsules CP-2 Tablets Lanoril Salicylate  Anaprox Cuprimine Capsules Levenox Salocol  Anexsia-D Dalteparin Magan Salsalate  Anodynos Darvon compound Magnesium Salicylate Sine-off  Ansaid Dasin Capsules Magsal Sodium Salicylate  Anturane Depen Capsules Marnal Soma  APF Arthritis pain formula Dewitt's Pills Measurin Stanback  Argesic Dia-Gesic Meclofenamic Sulfinpyrazone  Arthritis Bayer Timed Release Aspirin Diclofenac Meclomen Sulindac  Arthritis pain formula Anacin Dicumarol Medipren Supac  Analgesic (Safety coated) Arthralgen Diffunasal Mefanamic Suprofen  Arthritis Strength Bufferin Dihydrocodeine Mepro Compound Suprol  Arthropan liquid Dopirydamole Methcarbomol with Aspirin Synalgos  ASA tablets/Enseals Disalcid Micrainin Tagament  Ascriptin Doan's Midol Talwin  Ascriptin A/D Dolene Mobidin Tanderil  Ascriptin Extra Strength Dolobid Moblgesic Ticlid  Ascriptin with Codeine  Doloprin or Doloprin with Codeine Momentum Tolectin  Asperbuf Duoprin Mono-gesic Trendar  Aspergum Duradyne Motrin or Motrin IB Triminicin  Aspirin plain, buffered or enteric coated Durasal Myochrisine Trigesic  Aspirin Suppositories Easprin Nalfon Trillsate  Aspirin with Codeine Ecotrin Regular or Extra Strength Naprosyn Uracel  Atromid-S Efficin Naproxen Ursinus  Auranofin Capsules Elmiron Neocylate Vanquish  Axotal Emagrin Norgesic Verin  Azathioprine Empirin or Empirin with Codeine Normiflo Vitamin E  Azolid Emprazil Nuprin Voltaren  Bayer Aspirin plain, buffered or children's or timed BC Tablets or powders Encaprin Orgaran Warfarin Sodium  Buff-a-Comp Enoxaparin Orudis Zorpin  Buff-a-Comp with Codeine Equegesic Os-Cal-Gesic   Buffaprin Excedrin plain, buffered or Extra Strength Oxalid   Bufferin Arthritis Strength Feldene Oxphenbutazone   Bufferin plain or Extra Strength Feldene Capsules Oxycodone with Aspirin   Bufferin with Codeine Fenoprofen Fenoprofen Pabalate or Pabalate-SF   Buffets II Flogesic Panagesic   Buffinol plain or Extra Strength Florinal or Florinal with Codeine Panwarfarin   Buf-Tabs Flurbiprofen Penicillamine   Butalbital Compound Four-way cold tablets Penicillin   Butazolidin Fragmin Pepto-Bismol   Carbenicillin Geminisyn Percodan   Carna Arthritis Reliever Geopen Persantine   Carprofen Gold's salt Persistin   Chloramphenicol Goody's Phenylbutazone   Chloromycetin Haltrain Piroxlcam   Clmetidine heparin Plaquenil   Cllnoril Hyco-pap Ponstel   Clofibrate Hydroxy chloroquine Propoxyphen         Before stopping any of these medications, be sure to consult the physician who ordered them.  Some, such as Coumadin (Warfarin) are ordered to prevent or treat serious conditions such as "deep thrombosis", "pumonary embolisms", and other heart problems.  The amount of time that you may need off of the medication may also vary with the medication and the reason for which  you were taking it.  If you are taking any of these medications, please make sure you notify your pain physician before you undergo any procedures.         Pain Management Discharge Instructions  General Discharge Instructions :  If you need to reach your doctor call: Monday-Friday 8:00 am - 4:00 pm at [phone] or toll free [phone].  After clinic hours [phone] to have operator reach doctor.  Bring all of your medication bottles to all your appointments in the pain clinic.  To cancel or reschedule your appointment with Pain Management please remember to call 24 hours in advance to avoid a fee.  Refer to the educational materials which you have been given on: General Risks, I had my Procedure. Discharge Instructions, Post Sedation.  Post Procedure Instructions:  The drugs you were given will stay in your system until tomorrow, so for the next 24 hours you should not drive, make any legal decisions or drink any alcoholic beverages.  You may eat anything you prefer, but it is better to start with liquids then soups and crackers, and gradually work up to solid foods.  Please notify your doctor immediately if you have any unusual bleeding, trouble breathing or pain that is not related to your normal pain.  Depending on the type of procedure that was done, some parts of your body may feel week and/or numb.  This usually clears up by tonight or the next day.  Walk with the use of an assistive device or accompanied by an adult for the 24 hours.  You may use ice on the affected area for the first 24 hours.  Put ice in a Ziploc bag and cover with a towel and place against area 15 minutes on 15 minutes off.  You may switch to heat after 24 hours.

## 2015-10-10 NOTE — Progress Notes (Signed)
Safety precautions to be maintained throughout the outpatient stay will include: orient to surroundings, keep bed in low position, maintain call bell within reach at all times, provide assistance with transfer out of bed and ambulation.  

## 2015-10-10 NOTE — Progress Notes (Signed)
Permission received to prescribe pain meds.  Dr Primus Bravo notified.

## 2015-10-10 NOTE — Progress Notes (Signed)
   Subjective:    Patient ID: Mario [name], male    DOB: [birth year], 55 y.o.   MRN: [id number]  HPI                                                 RIGHT SHOULDER INJECTION    The patient is a 55-year-old gentleman who returns to pain management for further evaluation and treatment of pain involving the region of the right shoulder especially. The patient is with prior history of degenerative changes of the shoulder. The risks benefits and expectations of procedure have been discussed and explained to patient who is with understanding and wishes to proceed with procedure in attempt to decrease severity of symptoms, minimize progression of symptoms, and avoid the need for more involved treatment are in agreement to proceed with shoulder injection as planned    Description Of Procedure:  Right Shoulder Injection (Anterior Approach)  With the patient in sitting position with EKG, blood pressure, pulse, and pulse oximetry monitoring all in place, Betadine prep of proposed entry site was accomplished following identification of landmarks for right shoulder injection anterior approach. Following identification of landmarks for shoulder injection anterior approach, a 22-gauge needle was inserted in the anterior region of the right shoulder and 2 cc of 0.25% bupivacaine with Kenalog was injected for right shoulder injection anterior approach. The patient tolerated the injection well  Description Of Procedure  Right Shoulder Injection (Posterior Approach)  With the patient in the sitting position with EKG, blood pressure, pulse, and pulse oximetry monitoring all in place, Betadine prep of proposed entry site was accomplished following identification of landmarks for right shoulder injection posterior approach. Following identification of landmarks for shoulder injection posterior approach, a 22-gauge needle was inserted in the region of the right shoulder and 2 cc of 0.25% bupivacaine with Kenalog  was injected for right shoulder injection posterior approach. The patient tolerated the injection well.  Myoneural block injections of the trapezius musculature region Following Betadine prep of proposed entry site a 22-gauge needle was inserted into the trapezius musculature region and following negative aspiration 2 cc of 0.25% bupivacaine with Norflex and Kenalog was injected for myoneural block injection of the trapezius musculature region times two.   The patient tolerated the procedure well.  A total of 40 mg of Kenalog was utilized for the entire procedure.    PLAN:     Continue present medication at this time  F/U PCP Dr Ginette Pitman for evaluation of  BP and general medical  condition  F/U surgical evaluation. May consider pending follow-up evaluations  F/U neurological evaluation. May consider PNCV/EMG studies and other studies pending follow-up evaluations  Patient to call Pain Management Center should patient have concerns prior to scheduled return appointment.         Review of Systems     Objective:   Physical Exam        Assessment & Plan:

## 2015-10-11 ENCOUNTER — Telehealth: Payer: Self-pay | Admitting: *Deleted

## 2015-10-11 NOTE — Telephone Encounter (Signed)
Voicemail left for patient to call our office if there are questions or concerns from procedure on yesterday.

## 2015-10-12 ENCOUNTER — Inpatient Hospital Stay: Payer: BLUE CROSS/BLUE SHIELD

## 2015-10-12 ENCOUNTER — Inpatient Hospital Stay: Payer: BLUE CROSS/BLUE SHIELD | Attending: Internal Medicine | Admitting: Internal Medicine

## 2015-10-12 VITALS — BP 119/69 | HR 59 | Temp 96.7°F | Resp 18 | Wt 116.2 lb

## 2015-10-12 DIAGNOSIS — Z79899 Other long term (current) drug therapy: Secondary | ICD-10-CM | POA: Insufficient documentation

## 2015-10-12 DIAGNOSIS — F17210 Nicotine dependence, cigarettes, uncomplicated: Secondary | ICD-10-CM | POA: Insufficient documentation

## 2015-10-12 DIAGNOSIS — M25511 Pain in right shoulder: Secondary | ICD-10-CM | POA: Diagnosis not present

## 2015-10-12 DIAGNOSIS — M25561 Pain in right knee: Secondary | ICD-10-CM | POA: Diagnosis not present

## 2015-10-12 DIAGNOSIS — B1920 Unspecified viral hepatitis C without hepatic coma: Secondary | ICD-10-CM | POA: Insufficient documentation

## 2015-10-12 DIAGNOSIS — Z87442 Personal history of urinary calculi: Secondary | ICD-10-CM | POA: Diagnosis not present

## 2015-10-12 DIAGNOSIS — Z5112 Encounter for antineoplastic immunotherapy: Secondary | ICD-10-CM | POA: Insufficient documentation

## 2015-10-12 DIAGNOSIS — C3411 Malignant neoplasm of upper lobe, right bronchus or lung: Secondary | ICD-10-CM | POA: Diagnosis not present

## 2015-10-12 DIAGNOSIS — M129 Arthropathy, unspecified: Secondary | ICD-10-CM | POA: Insufficient documentation

## 2015-10-12 DIAGNOSIS — Z8781 Personal history of (healed) traumatic fracture: Secondary | ICD-10-CM

## 2015-10-12 DIAGNOSIS — G8929 Other chronic pain: Secondary | ICD-10-CM | POA: Insufficient documentation

## 2015-10-12 DIAGNOSIS — R0602 Shortness of breath: Secondary | ICD-10-CM | POA: Diagnosis not present

## 2015-10-12 LAB — TSH: TSH: 2.339 u[IU]/mL (ref 0.350–4.500)

## 2015-10-12 LAB — COMPREHENSIVE METABOLIC PANEL
ALBUMIN: 4.1 g/dL (ref 3.5–5.0)
ALK PHOS: 34 U/L — AB (ref 38–126)
ALT: 56 U/L (ref 17–63)
ANION GAP: 4 — AB (ref 5–15)
AST: 63 U/L — AB (ref 15–41)
BILIRUBIN TOTAL: 0.6 mg/dL (ref 0.3–1.2)
BUN: 25 mg/dL — AB (ref 6–20)
CALCIUM: 8.8 mg/dL — AB (ref 8.9–10.3)
CO2: 24 mmol/L (ref 22–32)
Chloride: 105 mmol/L (ref 101–111)
Creatinine, Ser: 1.01 mg/dL (ref 0.61–1.24)
GFR calc Af Amer: >60 mL/min (ref >60)
GFR calc non Af Amer: >60 mL/min (ref >60)
GLUCOSE: 117 mg/dL — AB (ref 65–99)
Potassium: 3.8 mmol/L (ref 3.5–5.1)
SODIUM: 133 mmol/L — AB (ref 135–145)
TOTAL PROTEIN: 8.1 g/dL (ref 6.5–8.1)

## 2015-10-12 LAB — CBC WITH DIFFERENTIAL/PLATELET
BASOS ABS: 0 10*3/uL (ref 0–0.1)
BASOS PCT: 1 %
Eosinophils Absolute: 0 10*3/uL (ref 0–0.7)
Eosinophils Relative: 0 %
HEMATOCRIT: 37.9 % — AB (ref 40.0–52.0)
HEMOGLOBIN: 12.7 g/dL — AB (ref 13.0–18.0)
Lymphocytes Relative: 28 %
Lymphs Abs: 1.9 10*3/uL (ref 1.0–3.6)
MCH: 30 pg (ref 26.0–34.0)
MCHC: 33.5 g/dL (ref 32.0–36.0)
MCV: 89.7 fL (ref 80.0–100.0)
MONOS PCT: 14 %
Monocytes Absolute: 0.9 10*3/uL (ref 0.2–1.0)
NEUTROS ABS: 3.8 10*3/uL (ref 1.4–6.5)
NEUTROS PCT: 57 %
Platelets: 162 10*3/uL (ref 150–440)
RBC: 4.22 MIL/uL — AB (ref 4.40–5.90)
RDW: 12.9 % (ref 11.5–14.5)
WBC: 6.7 10*3/uL (ref 3.8–10.6)

## 2015-10-12 MED ORDER — HEPARIN SOD (PORK) LOCK FLUSH 100 UNIT/ML IV SOLN
500.0000 [IU] | Freq: Once | INTRAVENOUS | Status: AC
  Administered 2015-10-12: 500 [IU] via INTRAVENOUS

## 2015-10-12 MED ORDER — SODIUM CHLORIDE 0.9 % IV SOLN
1200.0000 mg | Freq: Once | INTRAVENOUS | Status: AC
  Administered 2015-10-12: 1200 mg via INTRAVENOUS
  Filled 2015-10-12: qty 20

## 2015-10-12 MED ORDER — SODIUM CHLORIDE 0.9 % IV SOLN
Freq: Once | INTRAVENOUS | Status: AC
  Administered 2015-10-12: 15:00:00 via INTRAVENOUS
  Filled 2015-10-12: qty 1000

## 2015-10-12 MED ORDER — HEPARIN SOD (PORK) LOCK FLUSH 100 UNIT/ML IV SOLN
INTRAVENOUS | Status: AC
  Filled 2015-10-12: qty 5

## 2015-10-12 MED ORDER — SODIUM CHLORIDE 0.9% FLUSH
10.0000 mL | INTRAVENOUS | Status: DC | PRN
  Filled 2015-10-12: qty 10

## 2015-10-12 NOTE — Progress Notes (Signed)
.[place] OFFICE PROGRESS NOTE  Patient Care Team: Tracie Harrier, MD as PCP - General (Internal Medicine) Mohammed Kindle, MD as Attending Physician (Pain Medicine)   SUMMARY OF ONCOLOGIC HISTORY:  Oncology History   # MARCH 2016- LUNG CA- RUL- ADENO CA [mol testing-NA sec to poor samples] STAGE III s/p chemo-RT; [SEP 2016-CT Bx for mol testing; neg for malignancy]; CT-29th  NOV 2016-  Stable/ improved right upper lobe lesion;   # RECURRENT- CT JAN 2016- Stable right upper lobe lung nodule ~ 2.5cm; but progressive RML ~1.8cm; Start Tecentriq q 3W x3 ; March 30th CT 2017- Improved Lung Lesions; RML- 1.6x1.0; RUL ~3cm.   # Hepatitis C- follow up [facility]?   # Chronic pain- on oxycodone.jan 2017-Bone scan- neg.       Cancer of upper lobe of right lung ([place])   09/19/2015 Initial Diagnosis Cancer of upper lobe of right lung ([place])     INTERVAL HISTORY:  Vague historian.  55 year old male patient with above history of  Adenocarcinoma/Recurrent currently on Tecentriq q 3W is here for follow-up.   Patient's appetite is fair. Chronic shortness of breath. On a cough. No hemoptysis. No abdominal pain nausea vomiting. No diarrhea.  Complains of chronic pain in his right shoulder and right neck; Patient has been to the pain clinic the last few weeks.  He complains of pain in his right leg/shin/knee.  REVIEW OF SYSTEMS:  No headaches or vision changes. No tingling and numbness.  PAST MEDICAL HISTORY :  Past Medical History  Diagnosis Date  . Arthritis   . Kidney stones   . Chicken pox   . Lung cancer ([place]) 2015    PAST SURGICAL HISTORY :   Past Surgical History  Procedure Laterality Date  . Orif femur fracture Right 2010  . Hand surgery Right 2010    ORIF of right index finder, 2nd carpometacarpal joint dislocation right hand, proximal right humerus  . Amputation of index finger  2010  . Shoulder surgery Right 2010    pins and rods in shoulder    FAMILY HISTORY  :  No family history on file.  SOCIAL HISTORY:   Social History  Substance Use Topics  . Smoking status: Current Every Day Smoker -- 2.00 packs/day for 20 years    Types: Cigarettes  . Smokeless tobacco: Never Used  . Alcohol Use: 0.0 oz/week    0 Standard drinks or equivalent per week    ALLERGIES:  has No Known Allergies.  MEDICATIONS:  Current Outpatient Prescriptions  Medication Sig Dispense Refill  . albuterol (PROVENTIL HFA;VENTOLIN HFA) 108 (90 Base) MCG/ACT inhaler Inhale 2 puffs into the lungs every 6 (six) hours as needed for wheezing or shortness of breath. 1 Inhaler 2  . Aspirin-Salicylamide-Caffeine (BC HEADACHE POWDER PO) Take by mouth as needed (I take goodies BC powder intermittently.). Reported on 07/07/2015    . HYDROcodone-acetaminophen (NORCO) 5-325 MG tablet Take 1 tablet by mouth every 6 (six) hours as needed for severe pain. 12 tablet 0  . Oxycodone HCl 10 MG TABS Take 1 once every 8-12 hours as needed for pain. 60 tablet 0   No current facility-administered medications for this visit.   Facility-Administered Medications Ordered in Other Visits  Medication Dose Route Frequency Provider Last Rate Last Dose  . sodium chloride flush (NS) 0.9 % injection 10 mL  10 mL Intracatheter PRN Cammie Sickle, MD   10 mL at 05/26/15 1300  . sodium chloride flush (NS) 0.9 %  injection 10 mL  10 mL Intravenous PRN Cammie Sickle, MD   10 mL at 07/07/15 1425  . sodium chloride flush (NS) 0.9 % injection 10 mL  10 mL Intravenous PRN Cammie Sickle, MD        PHYSICAL EXAMINATION: ECOG PERFORMANCE STATUS: 0 - Asymptomatic  BP 119/69 mmHg  Pulse 59  Temp(Src) 96.7 F (35.9 C) (Tympanic)  Resp 18  Wt 116 lb 2.9 oz (52.7 kg)  Filed Weights   10/12/15 1444  Weight: 116 lb 2.9 oz (52.7 kg)    GENERAL:  Thin built/ moderately nourished; Alert, no distress and comfortable. He is alone.  EYES: no pallor or icterus OROPHARYNX: no thrush or ulceration; poor  dentition  NECK: supple, no masses felt LYMPH:  no palpable lymphadenopathy in the cervical, axillary or inguinal regions LUNGS:  Decreased breath sounds bilaterally to auscultation. No wheeze or crackles HEART/CVS: regular rate & rhythm and no murmurs; No lower extremity edema ABDOMEN:abdomen soft, non-tender and normal bowel sounds Musculoskeletal:no cyanosis of digits and no clubbing  PSYCH: alert & oriented x 3 with fluent speech NEURO: no focal motor/sensory deficits SKIN:  no rashes or significant lesions  LABORATORY DATA:  I have reviewed the data as listed    Component Value Date/Time   NA 133* 10/12/2015 1413   NA 136 07/27/2014 0856   K 3.8 10/12/2015 1413   K 3.9 07/27/2014 0856   CL 105 10/12/2015 1413   CL 104 07/27/2014 0856   CO2 24 10/12/2015 1413   CO2 27 07/27/2014 0856   GLUCOSE 117* 10/12/2015 1413   GLUCOSE 151* 07/27/2014 0856   BUN 25* 10/12/2015 1413   BUN 10 07/27/2014 0856   CREATININE 1.01 10/12/2015 1413   CREATININE 0.86 07/27/2014 0856   CREATININE 0.77 07/06/2014   CALCIUM 8.8* 10/12/2015 1413   CALCIUM 8.6* 07/27/2014 0856   PROT 8.1 10/12/2015 1413   PROT 8.8* 06/28/2014 1512   ALBUMIN 4.1 10/12/2015 1413   ALBUMIN 4.0 06/28/2014 1512   AST 63* 10/12/2015 1413   AST 37 06/28/2014 1512   ALT 56 10/12/2015 1413   ALT 43 06/28/2014 1512   ALKPHOS 34* 10/12/2015 1413   ALKPHOS 53 06/28/2014 1512   BILITOT 0.6 10/12/2015 1413   BILITOT 0.3 06/28/2014 1512   GFRNONAA >60 10/12/2015 1413   GFRNONAA >60 07/27/2014 0856   GFRAA >60 10/12/2015 1413   GFRAA >60 07/27/2014 0856    No results found for: SPEP, UPEP  Lab Results  Component Value Date   WBC 6.7 10/12/2015   NEUTROABS 3.8 10/12/2015   HGB 12.7* 10/12/2015   HCT 37.9* 10/12/2015   MCV 89.7 10/12/2015   PLT 162 10/12/2015      Chemistry      Component Value Date/Time   NA 133* 10/12/2015 1413   NA 136 07/27/2014 0856   K 3.8 10/12/2015 1413   K 3.9 07/27/2014 0856    CL 105 10/12/2015 1413   CL 104 07/27/2014 0856   CO2 24 10/12/2015 1413   CO2 27 07/27/2014 0856   BUN 25* 10/12/2015 1413   BUN 10 07/27/2014 0856   CREATININE 1.01 10/12/2015 1413   CREATININE 0.86 07/27/2014 0856   CREATININE 0.77 07/06/2014      Component Value Date/Time   CALCIUM 8.8* 10/12/2015 1413   CALCIUM 8.6* 07/27/2014 0856   ALKPHOS 34* 10/12/2015 1413   ALKPHOS 53 06/28/2014 1512   AST 63* 10/12/2015 1413   AST 37 06/28/2014 1512  ALT 56 10/12/2015 1413   ALT 43 06/28/2014 1512   BILITOT 0.6 10/12/2015 1413   BILITOT 0.3 06/28/2014 1512     Lungs/Pleura: Previously noted right upper lobe pulmonary nodule abutting the major fissure has decreased in size, currently measuring 1.6 x 1.0 cm (axial image 22 of series 3), previously 1.3 x 1.8 cm on 04/27/2015. Previously noted pleural-based mass in the periphery of the right upper lobe near the apex appears slightly less bulky than the prior examination, currently a more sessile appearing 3.1 x 1.4 cm mass-like opacity (image 14 of series 3). Today's examination demonstrates extensive new areas of ground-glass attenuation nodularity throughout the lungs bilaterally, typically in a peribronchovascular distribution. This is most extensive in the lower lungs. Mild diffuse bronchial wall thickening with mild centrilobular and moderate paraseptal emphysema. No pleural effusions.  ASSESSMENT & PLAN:   Cancer of upper lobe of right lung ([place]) Recurrent adenocarcinoma lung- currently on Tecentriq- tolerating well clinically no evidence of progression;    # Proceed with treatment today. LFTs within normal limits; labs-CBC CMP unremarkable.   # Chronic pain neck pain/right shoulder pain- patient has been evaluated at pain clinic; he does not remember the pain physicians name. He will call us with the physician's name and I'll contact the pain doctor. I would not prescribe any pain medication from my standpoint.  # Will  get a CT scan in approximately 2 weeks; follow up with me in 3 weeks labs/also get a TSH today./Infusion    Patient will follow-up with me in approximately 3 Tecentriq infusion/labs.  Cammie Sickle, MD 10/12/2015 5:46 PM

## 2015-10-12 NOTE — Assessment & Plan Note (Addendum)
Recurrent adenocarcinoma lung- currently on Tecentriq- tolerating well clinically no evidence of progression;    # Proceed with treatment today. LFTs within normal limits; labs-CBC CMP unremarkable.   # Chronic pain neck pain/right shoulder pain- patient has been evaluated at pain clinic; he does not remember the pain physicians name. He will call us with the physician's name and I'll contact the pain doctor. I would not prescribe any pain medication from my standpoint.  # Will get a CT scan in approximately 2 weeks; follow up with me in 3 weeks labs/also get a TSH today./Infusion

## 2015-10-12 NOTE — Progress Notes (Signed)
Patient states he is having some leg pain.  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

## 2015-10-18 ENCOUNTER — Encounter: Payer: Self-pay | Admitting: Pain Medicine

## 2015-10-18 ENCOUNTER — Ambulatory Visit: Payer: BLUE CROSS/BLUE SHIELD | Attending: Pain Medicine | Admitting: Pain Medicine

## 2015-10-18 VITALS — BP 135/54 | HR 65 | Temp 98.1°F | Resp 16 | Ht 69.0 in | Wt 125.0 lb

## 2015-10-18 DIAGNOSIS — M25519 Pain in unspecified shoulder: Secondary | ICD-10-CM | POA: Diagnosis present

## 2015-10-18 DIAGNOSIS — M1651 Unilateral post-traumatic osteoarthritis, right hip: Secondary | ICD-10-CM | POA: Insufficient documentation

## 2015-10-18 DIAGNOSIS — M19111 Post-traumatic osteoarthritis, right shoulder: Secondary | ICD-10-CM | POA: Insufficient documentation

## 2015-10-18 DIAGNOSIS — M542 Cervicalgia: Secondary | ICD-10-CM | POA: Diagnosis present

## 2015-10-18 DIAGNOSIS — M533 Sacrococcygeal disorders, not elsewhere classified: Secondary | ICD-10-CM | POA: Diagnosis not present

## 2015-10-18 DIAGNOSIS — M5136 Other intervertebral disc degeneration, lumbar region: Secondary | ICD-10-CM | POA: Diagnosis not present

## 2015-10-18 DIAGNOSIS — Z89021 Acquired absence of right finger(s): Secondary | ICD-10-CM | POA: Insufficient documentation

## 2015-10-18 DIAGNOSIS — C3491 Malignant neoplasm of unspecified part of right bronchus or lung: Secondary | ICD-10-CM | POA: Diagnosis not present

## 2015-10-18 DIAGNOSIS — C3401 Malignant neoplasm of right main bronchus: Secondary | ICD-10-CM

## 2015-10-18 MED ORDER — OXYCODONE HCL 10 MG PO TABS: ORAL_TABLET | ORAL | Status: DC

## 2015-10-18 NOTE — Progress Notes (Signed)
[place], last prescribed Oxycodone 10 ng #20, take 1 every 6 hours. Prescribed on 10-07-15. Received Oxycodone 10 mg #60, one every 8-12 hours as needed, on 08-05-15.

## 2015-10-18 NOTE — Progress Notes (Signed)
Safety precautions to be maintained throughout the outpatient stay will include: orient to surroundings, keep bed in low position, maintain call bell within reach at all times, provide assistance with transfer out of bed and ambulation.  
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
## 2015-10-18 NOTE — Patient Instructions (Addendum)
PLAN  Continue present medications  Shoulder injection to be performed at time of return appointment  F/U PCP Dr. Ginette Pitman for evaluation of  BP and general medical  condition  F/U surgical evaluation. May consider pending follow-up evaluations  F/U neurological evaluation. May consider pending follow-up evaluations  F/U oncological evaluation with Dr.B as planned and as scheduled  May consider radiofrequency rhizolysis or intraspinal procedures pending response to present treatment and F/U evaluation   Patient to call Pain Management Center should patient have concerns prior to scheduled return appointment.

## 2015-10-19 ENCOUNTER — Ambulatory Visit: Payer: BLUE CROSS/BLUE SHIELD | Attending: Radiation Oncology | Admitting: Radiation Oncology

## 2015-10-26 ENCOUNTER — Ambulatory Visit: Payer: BLUE CROSS/BLUE SHIELD

## 2015-11-01 ENCOUNTER — Ambulatory Visit: Payer: BLUE CROSS/BLUE SHIELD | Admitting: Pain Medicine

## 2015-11-02 ENCOUNTER — Inpatient Hospital Stay: Payer: BLUE CROSS/BLUE SHIELD

## 2015-11-02 ENCOUNTER — Ambulatory Visit
Admission: RE | Admit: 2015-11-02 | Discharge: 2015-11-02 | Disposition: A | Discharge status: Home / Self Care | Payer: BLUE CROSS/BLUE SHIELD | Source: Ambulatory Visit | Attending: Internal Medicine | Admitting: Internal Medicine

## 2015-11-02 ENCOUNTER — Encounter: Payer: Self-pay | Admitting: Pain Medicine

## 2015-11-02 ENCOUNTER — Inpatient Hospital Stay: Payer: BLUE CROSS/BLUE SHIELD | Admitting: Internal Medicine

## 2015-11-02 ENCOUNTER — Ambulatory Visit: Payer: BLUE CROSS/BLUE SHIELD | Attending: Pain Medicine | Admitting: Pain Medicine

## 2015-11-02 VITALS — BP 135/67 | HR 59 | Temp 97.6°F | Resp 16 | Ht 69.0 in | Wt 120.0 lb

## 2015-11-02 DIAGNOSIS — M5030 Other cervical disc degeneration, unspecified cervical region: Secondary | ICD-10-CM

## 2015-11-02 DIAGNOSIS — J439 Emphysema, unspecified: Secondary | ICD-10-CM | POA: Insufficient documentation

## 2015-11-02 DIAGNOSIS — M19011 Primary osteoarthritis, right shoulder: Secondary | ICD-10-CM | POA: Insufficient documentation

## 2015-11-02 DIAGNOSIS — M19012 Primary osteoarthritis, left shoulder: Secondary | ICD-10-CM

## 2015-11-02 DIAGNOSIS — C3411 Malignant neoplasm of upper lobe, right bronchus or lung: Secondary | ICD-10-CM | POA: Insufficient documentation

## 2015-11-02 DIAGNOSIS — R911 Solitary pulmonary nodule: Secondary | ICD-10-CM | POA: Diagnosis not present

## 2015-11-02 DIAGNOSIS — M25511 Pain in right shoulder: Secondary | ICD-10-CM | POA: Diagnosis present

## 2015-11-02 DIAGNOSIS — M19019 Primary osteoarthritis, unspecified shoulder: Secondary | ICD-10-CM | POA: Insufficient documentation

## 2015-11-02 MED ORDER — BUPIVACAINE HCL (PF) 0.25 % IJ SOLN: 30.0000 mL | Freq: Once | INTRAMUSCULAR | Status: DC

## 2015-11-02 MED ORDER — TRIAMCINOLONE ACETONIDE 40 MG/ML IJ SUSP
INTRAMUSCULAR | Status: AC
  Filled 2015-11-02: qty 1

## 2015-11-02 MED ORDER — SODIUM CHLORIDE 0.9% FLUSH: 20.0000 mL | Freq: Once | INTRAVENOUS | Status: DC

## 2015-11-02 MED ORDER — IOPAMIDOL (ISOVUE-300) INJECTION 61%
75.0000 mL | Freq: Once | INTRAVENOUS | Status: AC | PRN
  Administered 2015-11-02: 75 mL via INTRAVENOUS

## 2015-11-02 MED ORDER — SODIUM CHLORIDE 0.9 % IJ SOLN
INTRAMUSCULAR | Status: AC
  Filled 2015-11-02: qty 20

## 2015-11-02 MED ORDER — BUPIVACAINE HCL (PF) 0.25 % IJ SOLN
INTRAMUSCULAR | Status: AC
  Filled 2015-11-02: qty 30

## 2015-11-02 MED ORDER — TRIAMCINOLONE ACETONIDE 40 MG/ML IJ SUSP: 40.0000 mg | Freq: Once | INTRAMUSCULAR | Status: DC

## 2015-11-02 NOTE — Progress Notes (Signed)
Safety precautions to be maintained throughout the outpatient stay will include: orient to surroundings, keep bed in low position, maintain call bell within reach at all times, provide assistance with transfer out of bed and ambulation.  

## 2015-11-02 NOTE — Progress Notes (Signed)
                                         RIGHT SHOULDER INJECTION    The patient is a 55 -year-old gentleman who returns to pain management for further evaluation and treatment of pain involving the region of the right shoulder. The patient is with prior studies revealing patient to be with degenerative changes of the shoulder. The risks benefits and expectations of procedure have been discussed and explained to patient who is with understanding and wishes to proceed with procedure in attempt to decrease severity of symptoms, minimize progression of symptoms, and avoid the need for more involved treatment are in agreement to proceed with shoulder injection as planned    Description Of Procedure:  Right Shoulder Injection (Anterior Approach)  With the patient in sitting position with EKG, blood pressure, pulse, and pulse oximetry monitoring all in place, Betadine prep of proposed entry site was accomplished following identification of landmarks for right shoulder injection anterior approach. Following identification of landmarks for shoulder injection anterior approach, a 22-gauge needle was inserted in the anterior region of the right shoulder and 2 cc of preservative-free normal saline with Kenalog was injected for right shoulder injection anterior approach. The patient tolerated the injection well  Description Of Procedure  Right Shoulder Injection (Posterior Approach)  With the patient in the sitting position with EKG, blood pressure, pulse, and pulse oximetry monitoring all in place, Betadine prep of proposed entry site was accomplished following identification of landmarks for right shoulder injection posterior approach. Following identification of landmarks for shoulder injection posterior approach, a 22-gauge needle was inserted in the region of the right shoulder and 2 cc of preservative-free normal saline with Kenalog was injected for right shoulder injection posterior approach. The patient  tolerated the injection well.  Myoneural block injections of the trapezius musculature region Following Betadine prep of proposed entry site a 22-gauge needle was inserted into the trapezius musculature region and following negative aspiration 2 cc of preservative-free normal saline with Kenalog was injected for myoneural block injection of the trapezius musculature region times two.  The patient tolerated the procedure well.  A total of 40 mg of Kenalog was utilized for the entire procedure.    PLAN:     Continue present medication oxycodone  F/U PCP Dr. Ginette Pitman for evaliation of  BP and general medical  condition  F/U surgical evaluation. May consider pending follow-up evaluations  F/U neurological evaluation. May consider pending follow-up evaluations  May consider radiofrequency rhizolysis or intraspinal procedures pending response to present treatment and F/U evaluation   Patient to call Pain Management Center should patient have concerns prior to scheduled return appointment.

## 2015-11-02 NOTE — Patient Instructions (Addendum)
PLAN  Continue present medications  F/U PCP Dr. Ginette Pitman for evaluation of  BP and general medical  condition  F/U surgical evaluation. May consider pending follow-up evaluations  F/U neurological evaluation. May consider pending follow-up evaluations  F/U oncological evaluation with Dr.B as planned and as scheduled  May consider radiofrequency rhizolysis or intraspinal procedures pending response to present treatment and F/U evaluation   Patient to call Pain Management Center should patient have concerns prior to scheduled return appointment.  Trigger Point Injection Trigger points are areas where you have muscle pain. A trigger point injection is a shot given in the trigger point to relieve that pain. A trigger point might feel like a knot in your muscle. It hurts to press on a trigger point. Sometimes the pain spreads out (radiates) to other parts of the body. For example, pressing on a trigger point in your shoulder might cause pain in your arm or neck. You might have one trigger point. Or, you might have more than one. People often have trigger points in their upper back and lower back. They also occur often in the neck and shoulders. Pain from a trigger point lasts for a long time. It can make it hard to keep moving. You might not be able to do the exercise or physical therapy that could help you deal with the pain. A trigger point injection may help. It does not work for everyone. But, it may relieve your pain for a few days or a few months. A trigger point injection does not cure long-lasting (chronic) pain. LET YOUR CAREGIVER KNOW ABOUT:  Any allergies (especially to latex, lidocaine, or steroids).  Blood-thinning medicines that you take. These drugs can lead to bleeding or bruising after an injection. They include:  Aspirin.  Ibuprofen.  Clopidogrel.  Warfarin.  Other medicines you take. This includes all vitamins, herbs, eyedrops, over-the-counter medicines, and creams.  Use  of steroids.  Recent infections.  Past problems with numbing medicines.  Bleeding problems.  Surgeries you have had.  Other health problems. RISKS AND COMPLICATIONS A trigger point injection is a safe treatment. However, problems may develop, such as:  Minor side effects usually go away in 1 to 2 days. These may include:  Soreness.  Bruising.  Stiffness.  More serious problems are rare. But, they may include:  Bleeding under the skin (hematoma).  Skin infection.  Breaking off of the needle under your skin.  Lung puncture.  The trigger point injection may not work for you. BEFORE THE PROCEDURE You may need to stop taking any medicine that thins your blood. This is to prevent bleeding and bruising. Usually these medicines are stopped several days before the injection. No other preparation is needed. PROCEDURE  A trigger point injection can be given in your caregiver's office or in a clinic. Each injection takes 2 minutes or less.  Your caregiver will feel for trigger points. The caregiver may use a marker to circle the area for the injection.  The skin over the trigger point will be washed with a germ-killing (antiseptic) solution.  The caregiver pinches the spot for the injection.  Then, a very thin needle is used for the shot. You may feel pain or a twitching feeling when the needle enters the trigger point.  A numbing solution may be injected into the trigger point. Sometimes a drug to keep down swelling, redness, and warmth (inflammation) is also injected.  Your caregiver moves the needle around the trigger zone until the tightness and  twitching goes away.  After the injection, your caregiver may put gentle pressure over the injection site.  Then it is covered with a bandage. AFTER THE PROCEDURE  You can go right home after the injection.  The bandage can be taken off after a few hours.  You may feel sore and stiff for 1 to 2 days.  Go back to your  regular activities slowly. Your caregiver may ask you to stretch your muscles. Do not do anything that takes extra energy for a few days.  Follow your caregiver's instructions to manage and treat other pain.   This information is not intended to replace advice given to you by your health care provider. Make sure you discuss any questions you have with your health care provider.   Document Released: 03/08/2011 Document Revised: 07/14/2012 Document Reviewed: 03/08/2011 Elsevier Interactive Patient Education 2016 [employer] Pain Management Discharge Instructions  General Discharge Instructions :  If you need to reach your doctor call: Monday-Friday 8:00 am - 4:00 pm at [phone] or toll free [phone].  After clinic hours [phone] to have operator reach doctor.  Bring all of your medication bottles to all your appointments in the pain clinic.  To cancel or reschedule your appointment with Pain Management please remember to call 24 hours in advance to avoid a fee.  Refer to the educational materials which you have been given on: General Risks, I had my Procedure. Discharge Instructions, Post Sedation.  Post Procedure Instructions:  The drugs you were given will stay in your system until tomorrow, so for the next 24 hours you should not drive, make any legal decisions or drink any alcoholic beverages.  You may eat anything you prefer, but it is better to start with liquids then soups and crackers, and gradually work up to solid foods.  Please notify your doctor immediately if you have any unusual bleeding, trouble breathing or pain that is not related to your normal pain.  Depending on the type of procedure that was done, some parts of your body may feel week and/or numb.  This usually clears up by tonight or the next day.  Walk with the use of an assistive device or accompanied by an adult for the 24 hours.  You may use ice on the affected area for the first 24 hours.   Put ice in a Ziploc bag and cover with a towel and place against area 15 minutes on 15 minutes off.  You may switch to heat after 24 hours.

## 2015-11-03 ENCOUNTER — Ambulatory Visit: Payer: BLUE CROSS/BLUE SHIELD

## 2015-11-03 ENCOUNTER — Ambulatory Visit: Payer: BLUE CROSS/BLUE SHIELD | Admitting: Internal Medicine

## 2015-11-03 ENCOUNTER — Other Ambulatory Visit: Payer: BLUE CROSS/BLUE SHIELD

## 2015-11-03 NOTE — Telephone Encounter (Signed)
Left message to call if needed. 

## 2015-11-04 ENCOUNTER — Inpatient Hospital Stay: Payer: BLUE CROSS/BLUE SHIELD

## 2015-11-04 ENCOUNTER — Inpatient Hospital Stay: Payer: BLUE CROSS/BLUE SHIELD | Attending: Internal Medicine | Admitting: Internal Medicine

## 2015-11-04 VITALS — BP 152/65 | HR 80 | Temp 97.5°F | Resp 18 | Ht 69.0 in | Wt 121.1 lb

## 2015-11-04 DIAGNOSIS — G8929 Other chronic pain: Secondary | ICD-10-CM | POA: Insufficient documentation

## 2015-11-04 DIAGNOSIS — B1920 Unspecified viral hepatitis C without hepatic coma: Secondary | ICD-10-CM | POA: Insufficient documentation

## 2015-11-04 DIAGNOSIS — F17210 Nicotine dependence, cigarettes, uncomplicated: Secondary | ICD-10-CM | POA: Insufficient documentation

## 2015-11-04 DIAGNOSIS — Z87442 Personal history of urinary calculi: Secondary | ICD-10-CM | POA: Insufficient documentation

## 2015-11-04 DIAGNOSIS — J439 Emphysema, unspecified: Secondary | ICD-10-CM | POA: Diagnosis not present

## 2015-11-04 DIAGNOSIS — C3411 Malignant neoplasm of upper lobe, right bronchus or lung: Secondary | ICD-10-CM | POA: Insufficient documentation

## 2015-11-04 DIAGNOSIS — R7989 Other specified abnormal findings of blood chemistry: Secondary | ICD-10-CM | POA: Diagnosis not present

## 2015-11-04 DIAGNOSIS — Z79899 Other long term (current) drug therapy: Secondary | ICD-10-CM | POA: Diagnosis not present

## 2015-11-04 DIAGNOSIS — M25511 Pain in right shoulder: Secondary | ICD-10-CM | POA: Insufficient documentation

## 2015-11-04 DIAGNOSIS — M129 Arthropathy, unspecified: Secondary | ICD-10-CM | POA: Insufficient documentation

## 2015-11-04 LAB — COMPREHENSIVE METABOLIC PANEL
ALBUMIN: 3.8 g/dL (ref 3.5–5.0)
ALT: 274 U/L — ABNORMAL HIGH (ref 17–63)
ANION GAP: 8 (ref 5–15)
AST: 205 U/L — ABNORMAL HIGH (ref 15–41)
Alkaline Phosphatase: 44 U/L (ref 38–126)
BUN: 15 mg/dL (ref 6–20)
CHLORIDE: 102 mmol/L (ref 101–111)
CO2: 22 mmol/L (ref 22–32)
Calcium: 8.5 mg/dL — ABNORMAL LOW (ref 8.9–10.3)
Creatinine, Ser: 1.06 mg/dL (ref 0.61–1.24)
GFR calc non Af Amer: >60 mL/min (ref >60)
GLUCOSE: 167 mg/dL — AB (ref 65–99)
Potassium: 3.7 mmol/L (ref 3.5–5.1)
SODIUM: 132 mmol/L — AB (ref 135–145)
Total Bilirubin: 0.6 mg/dL (ref 0.3–1.2)
Total Protein: 7.6 g/dL (ref 6.5–8.1)

## 2015-11-04 LAB — CBC WITH DIFFERENTIAL/PLATELET
Basophils Absolute: 0 10*3/uL (ref 0–0.1)
Basophils Relative: 1 %
Eosinophils Absolute: 0 10*3/uL (ref 0–0.7)
Eosinophils Relative: 0 %
HEMATOCRIT: 37.2 % — AB (ref 40.0–52.0)
HEMOGLOBIN: 12.5 g/dL — AB (ref 13.0–18.0)
LYMPHS ABS: 1.2 10*3/uL (ref 1.0–3.6)
LYMPHS PCT: 19 %
MCH: 30.3 pg (ref 26.0–34.0)
MCHC: 33.7 g/dL (ref 32.0–36.0)
MCV: 89.9 fL (ref 80.0–100.0)
MONO ABS: 0.4 10*3/uL (ref 0.2–1.0)
MONOS PCT: 7 %
NEUTROS ABS: 4.5 10*3/uL (ref 1.4–6.5)
NEUTROS PCT: 73 %
Platelets: 171 10*3/uL (ref 150–440)
RBC: 4.14 MIL/uL — ABNORMAL LOW (ref 4.40–5.90)
RDW: 12.9 % (ref 11.5–14.5)
WBC: 6.1 10*3/uL (ref 3.8–10.6)

## 2015-11-04 MED ORDER — PREDNISONE 20 MG PO TABS: ORAL_TABLET | ORAL | 0 refills | Status: DC

## 2015-11-04 MED ORDER — HEPARIN SOD (PORK) LOCK FLUSH 100 UNIT/ML IV SOLN
500.0000 [IU] | Freq: Once | INTRAVENOUS | Status: AC
  Administered 2015-11-04: 500 [IU] via INTRAVENOUS

## 2015-11-04 MED ORDER — SODIUM CHLORIDE 0.9% FLUSH
10.0000 mL | Freq: Once | INTRAVENOUS | Status: AC
  Administered 2015-11-04: 10 mL via INTRAVENOUS
  Filled 2015-11-04: qty 10

## 2015-11-04 MED ORDER — HEPARIN SOD (PORK) LOCK FLUSH 100 UNIT/ML IV SOLN
INTRAVENOUS | Status: AC
  Filled 2015-11-04: qty 5

## 2015-11-04 NOTE — Progress Notes (Signed)
.[facility] OFFICE PROGRESS NOTE  Patient Care Team: Tracie Harrier, MD as PCP - General (Internal Medicine) Mohammed Kindle, MD as Attending Physician (Pain Medicine)   SUMMARY OF ONCOLOGIC HISTORY:  Oncology History   # MARCH 2016- LUNG CA- RUL- ADENO CA [mol testing-NA sec to poor samples] STAGE III s/p chemo-RT; [SEP 2016-CT Bx for mol testing; neg for malignancy]; CT-29th  NOV 2016-  Stable/ improved right upper lobe lesion;   # RECURRENT- CT JAN 2016- Stable right upper lobe lung nodule ~ 2.5cm; but progressive RML ~1.8cm; Start Tecentriq q 3W x3 ; March 30th CT 2017- Improved Lung Lesions; RML- 1.6x1.0; RUL ~3cm.; AUG 2nd 2017-CT stable.   # Hepatitis C- follow up [facility]?   # Chronic pain- on oxycodone.jan 2017-Bone scan- neg.       Cancer of upper lobe of right lung ([place])   09/19/2015 Initial Diagnosis    Cancer of upper lobe of right lung ([place])       INTERVAL HISTORY:  Vague historian.  55 year old male patient with above history of  Adenocarcinoma/Recurrent currently on Tecentriq q 3W is here for follow-up/ review CT scan.   Patient's appetite is fair. Chronic shortness of breath. On a cough. No hemoptysis. No abdominal pain nausea vomiting. No diarrhea.  Complains of chronic pain in his right shoulder and right neck s/p pain shots.   REVIEW OF SYSTEMS:  No headaches or vision changes. No tingling and numbness.  PAST MEDICAL HISTORY :  Past Medical History:  Diagnosis Date  . Arthritis   . Chicken pox   . Kidney stones   . Lung cancer ([place]) 2015    PAST SURGICAL HISTORY :   Past Surgical History:  Procedure Laterality Date  . amputation of index finger  2010  . HAND SURGERY Right 2010   ORIF of right index finder, 2nd carpometacarpal joint dislocation right hand, proximal right humerus  . ORIF FEMUR FRACTURE Right 2010  . SHOULDER SURGERY Right 2010   pins and rods in shoulder    FAMILY HISTORY :  No family history on file.  SOCIAL  HISTORY:   Social History  Substance Use Topics  . Smoking status: Current Every Day Smoker    Packs/day: 2.00    Years: 20.00    Types: Cigarettes  . Smokeless tobacco: Never Used  . Alcohol use 0.0 oz/week    ALLERGIES:  has no active allergies.  MEDICATIONS:  Current Outpatient Prescriptions  Medication Sig Dispense Refill  . albuterol (PROVENTIL HFA;VENTOLIN HFA) 108 (90 Base) MCG/ACT inhaler Inhale 2 puffs into the lungs every 6 (six) hours as needed for wheezing or shortness of breath. 1 Inhaler 2  . Aspirin-Salicylamide-Caffeine (BC HEADACHE POWDER PO) Take by mouth as needed (I take goodies BC powder intermittently.). Reported on 07/07/2015    . HYDROcodone-acetaminophen (NORCO) 5-325 MG tablet Take 1 tablet by mouth every 6 (six) hours as needed for severe pain. 12 tablet 0  . Oxycodone HCl 10 MG TABS Take 1 once every 8-12 hours as needed for pain. 60 tablet 0  . Oxycodone HCl 10 MG TABS Limit one half to one tablet by mouth per day or twice per day if tolerated 60 tablet 0  . predniSONE (DELTASONE) 20 MG tablet Take 2 pills once a day x 7 days; and then once a day.take it with food. 40 tablet 0   Current Facility-Administered Medications  Medication Dose Route Frequency Provider Last Rate Last Dose  . bupivacaine (PF) (MARCAINE)  0.25 % injection 30 mL  30 mL Other Once Mohammed Kindle, MD      . sodium chloride flush (NS) 0.9 % injection 20 mL  20 mL Other Once Mohammed Kindle, MD      . triamcinolone acetonide [facility]) injection 40 mg  40 mg Other Once Mohammed Kindle, MD       Facility-Administered Medications Ordered in Other Visits  Medication Dose Route Frequency Provider Last Rate Last Dose  . sodium chloride flush (NS) 0.9 % injection 10 mL  10 mL Intracatheter PRN Cammie Sickle, MD   10 mL at 05/26/15 1300  . sodium chloride flush (NS) 0.9 % injection 10 mL  10 mL Intravenous PRN Cammie Sickle, MD   10 mL at 07/07/15 1425    PHYSICAL EXAMINATION: ECOG  PERFORMANCE STATUS: 0 - Asymptomatic  BP (!) 152/65 (BP Location: Right Arm, Patient Position: Sitting)   Pulse 80   Temp 97.5 F (36.4 C) (Tympanic)   Resp 18   Ht '5\' 9"'$  (1.753 m)   Wt 121 lb 1.6 oz (54.9 kg)   BMI 17.88 kg/m   Filed Weights   11/04/15 1318  Weight: 121 lb 1.6 oz (54.9 kg)    GENERAL:  Thin built/ moderately nourished; Alert, no distress and comfortable. He is alone.  EYES: no pallor or icterus OROPHARYNX: no thrush or ulceration; poor dentition  NECK: supple, no masses felt LYMPH:  no palpable lymphadenopathy in the cervical, axillary or inguinal regions LUNGS:  Decreased breath sounds bilaterally to auscultation. No wheeze or crackles HEART/CVS: regular rate & rhythm and no murmurs; No lower extremity edema ABDOMEN:abdomen soft, non-tender and normal bowel sounds Musculoskeletal:no cyanosis of digits and no clubbing  PSYCH: alert & oriented x 3 with fluent speech NEURO: no focal motor/sensory deficits SKIN:  no rashes or significant lesions  LABORATORY DATA:  I have reviewed the data as listed    Component Value Date/Time   NA 132 (L) 11/04/2015 1301   NA 136 07/27/2014 0856   K 3.7 11/04/2015 1301   K 3.9 07/27/2014 0856   CL 102 11/04/2015 1301   CL 104 07/27/2014 0856   CO2 22 11/04/2015 1301   CO2 27 07/27/2014 0856   GLUCOSE 167 (H) 11/04/2015 1301   GLUCOSE 151 (H) 07/27/2014 0856   BUN 15 11/04/2015 1301   BUN 10 07/27/2014 0856   CREATININE 1.06 11/04/2015 1301   CREATININE 0.86 07/27/2014 0856   CREATININE 0.77 07/06/2014   CALCIUM 8.5 (L) 11/04/2015 1301   CALCIUM 8.6 (L) 07/27/2014 0856   PROT 7.6 11/04/2015 1301   PROT 8.8 (H) 06/28/2014 1512   ALBUMIN 3.8 11/04/2015 1301   ALBUMIN 4.0 06/28/2014 1512   AST 205 (H) 11/04/2015 1301   AST 37 06/28/2014 1512   ALT 274 (H) 11/04/2015 1301   ALT 43 06/28/2014 1512   ALKPHOS 44 11/04/2015 1301   ALKPHOS 53 06/28/2014 1512   BILITOT 0.6 11/04/2015 1301   BILITOT 0.3 06/28/2014  1512   GFRNONAA >60 11/04/2015 1301   GFRNONAA >60 07/27/2014 0856   GFRAA >60 11/04/2015 1301   GFRAA >60 07/27/2014 0856    No results found for: SPEP, UPEP  Lab Results  Component Value Date   WBC 6.1 11/04/2015   NEUTROABS 4.5 11/04/2015   HGB 12.5 (L) 11/04/2015   HCT 37.2 (L) 11/04/2015   MCV 89.9 11/04/2015   PLT 171 11/04/2015      Chemistry      Component Value  Date/Time   NA 132 (L) 11/04/2015 1301   NA 136 07/27/2014 0856   K 3.7 11/04/2015 1301   K 3.9 07/27/2014 0856   CL 102 11/04/2015 1301   CL 104 07/27/2014 0856   CO2 22 11/04/2015 1301   CO2 27 07/27/2014 0856   BUN 15 11/04/2015 1301   BUN 10 07/27/2014 0856   CREATININE 1.06 11/04/2015 1301   CREATININE 0.86 07/27/2014 0856   CREATININE 0.77 07/06/2014      Component Value Date/Time   CALCIUM 8.5 (L) 11/04/2015 1301   CALCIUM 8.6 (L) 07/27/2014 0856   ALKPHOS 44 11/04/2015 1301   ALKPHOS 53 06/28/2014 1512   AST 205 (H) 11/04/2015 1301   AST 37 06/28/2014 1512   ALT 274 (H) 11/04/2015 1301   ALT 43 06/28/2014 1512   BILITOT 0.6 11/04/2015 1301   BILITOT 0.3 06/28/2014 1512     IMPRESSION: 1. Right upper lobe nodule along the major fissure is stable to a trace bit smaller on the current exam. 2. No substantial change in the pleural-based lateral right apical lesion. 3. No new or progressive findings on today's study. 4. The patchy areas of ground-glass nodularity seen distributed to both lungs on the previous exam have resolved in the interval 5. Emphysema.   Electronically Signed   By: Misty Stanley M.D.   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   # LFT elevation- AST/ALT ~ 200s ? Related to Tecentriq. Denies alcohol. Start prednsione '40mg'$ / 10 days; and then once a day  # Chronic pain neck  pain/right shoulder pain-follow up with pain clinic.   # sexual dysfunction- defer to PCP.   follow up with me in 3 weeks labs//Infusion  # I reviewed the blood work- with the patient in detail; also reviewed the imaging independently [as summarized above]; and with the patient in detail.     Patient will follow-up with me in approximately 3 Tecentriq infusion/labs.  Cammie Sickle, MD 11/04/2015 1:42 PM

## 2015-11-04 NOTE — Assessment & Plan Note (Addendum)
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   # LFT elevation- AST/ALT ~ 200s ? Related to Tecentriq. Denies alcohol. Start prednsione '40mg'$ / 10 days; and then once a day  # Chronic pain neck pain/right shoulder pain-follow up with pain clinic.   # sexual dysfunction- defer to PCP.   follow up with me in 3 weeks labs//Infusion; repeat LFts in 10 days.   # I reviewed the blood work- with the patient in detail; also reviewed the imaging independently [as summarized above]; and with the patient in detail.   Addendum:  pts' Tecentriq was held on 8/04 sec to elevated LFTs; rest plan as above.

## 2015-11-09 ENCOUNTER — Ambulatory Visit: Payer: BLUE CROSS/BLUE SHIELD | Attending: Pain Medicine | Admitting: Pain Medicine

## 2015-11-09 ENCOUNTER — Encounter: Payer: Self-pay | Admitting: Pain Medicine

## 2015-11-09 VITALS — BP 130/71 | HR 70 | Temp 98.3°F | Resp 14 | Ht 69.0 in | Wt 125.0 lb

## 2015-11-09 DIAGNOSIS — M5136 Other intervertebral disc degeneration, lumbar region: Secondary | ICD-10-CM | POA: Diagnosis not present

## 2015-11-09 DIAGNOSIS — M533 Sacrococcygeal disorders, not elsewhere classified: Secondary | ICD-10-CM | POA: Diagnosis not present

## 2015-11-09 DIAGNOSIS — Z89021 Acquired absence of right finger(s): Secondary | ICD-10-CM | POA: Diagnosis not present

## 2015-11-09 DIAGNOSIS — C3491 Malignant neoplasm of unspecified part of right bronchus or lung: Secondary | ICD-10-CM | POA: Insufficient documentation

## 2015-11-09 DIAGNOSIS — M19111 Post-traumatic osteoarthritis, right shoulder: Secondary | ICD-10-CM | POA: Insufficient documentation

## 2015-11-09 DIAGNOSIS — M19019 Primary osteoarthritis, unspecified shoulder: Secondary | ICD-10-CM | POA: Diagnosis not present

## 2015-11-09 DIAGNOSIS — M19011 Primary osteoarthritis, right shoulder: Secondary | ICD-10-CM

## 2015-11-09 DIAGNOSIS — S8290XD Unspecified fracture of unspecified lower leg, subsequent encounter for closed fracture with routine healing: Secondary | ICD-10-CM

## 2015-11-09 DIAGNOSIS — M1651 Unilateral post-traumatic osteoarthritis, right hip: Secondary | ICD-10-CM | POA: Diagnosis not present

## 2015-11-09 DIAGNOSIS — M19012 Primary osteoarthritis, left shoulder: Secondary | ICD-10-CM

## 2015-11-09 DIAGNOSIS — M25511 Pain in right shoulder: Secondary | ICD-10-CM | POA: Diagnosis present

## 2015-11-09 MED ORDER — OXYCODONE HCL 10 MG PO TABS: ORAL_TABLET | ORAL | 0 refills | Status: DC
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
## 2015-11-09 NOTE — Patient Instructions (Addendum)
PLAN  Continue present medication oxycodone  Shoulder injection to be performed at time of return appointment  F/U PCP Dr. Ginette Pitman for evaluation of  BP and general medical  condition  F/U surgical evaluation. May consider pending follow-up evaluations  F/U neurological evaluation. May consider pending follow-up evaluations  F/U oncological evaluation with Dr.B as planned and as scheduled  May consider radiofrequency rhizolysis or intraspinal procedures pending response to present treatment and F/U evaluation   Patient to call Pain Management Center should patient have concerns prior to scheduled return appointment.   Trigger Point Injections Patient Information  Description: Trigger points are areas of muscle sensitive to touch which cause pain with movement, sometimes felt some distance from the site of palpation.  Usually the muscle containing these trigger points if felt as a tight band or knot.   The area of maximum tenderness or trigger point is identified, and after antiseptic preparation of the skin, a small needle is placed into this site.  Reproduction of the pain often occurs and numbing medicine (local anesthetic) is injected into the site, sometimes along with steroid preparation.  The entire block usually lasts less than 5 minutes.  Conditions which may be treated by trigger points:   Muscular pain and spasm  Nerve irritation  Preparation for the injection:  1. Do not eat any solid food or dairy products within 8 hours of your appointment. 2. You may drink clear liquids up to 3 hours before appointment.  Clear liquids include water, black coffee, juice or soda.  No milk or cream please. 3. You may take your regular medications, including pain medications, with a sip of water before your appointment.  Diabetics should hold regular insulin ( if take separately) and take 1/2 normal NPH dose the morning of the procedure.  Carry some sugar containing items with you to your  appointment. 4. A driver must accompany you and be prepared to drive you home after your procedure.  5. Bring all your current medications with you. 6. An IV may be inserted and sedation may be given at the discretion of the physician.  7. A blood pressure cuff, EKG, and other monitors will often be applied during the procedure.  Some patients may need to have extra oxygen administered for a short period. 8. You will be asked to provide medical information, including your allergies and medications, prior to the procedure.  We must know immediately if you are taking blood thinners (like Coumadin/Warfarin) or if you are allergic to IV iodine contrast (dye).  We must know if you could possibly be pregnant.  Possible side-effects:   Bleeding from needle site  Infection (rare, may require surgery)  Nerve injury (rare)  Numbness & tingling (temporary)  Punctured lung (if injection around chest)  Light-headedness (temporary)  Pain at injection site (several days)  Decreased blood pressure (rare, temporary)  Weakness in arm/leg (temporary)  Call if you experience:   Hive or difficulty breathing (go to the emergency room)  Inflammation or drainage at the injection site(s)  Please note:  Although the local anesthetic injected can often make your painful muscle feel good for several hours after the injection, the pain may return.  It takes 3-7 days for steroids to work.  You may not notice any pain relief for at least one week.  If effective, we will often do a series of injections spaced 3-6 weeks apart to maximally decrease your pain.  If you have any questions please call [phone] [place]  [facility]

## 2015-11-14 ENCOUNTER — Inpatient Hospital Stay: Payer: BLUE CROSS/BLUE SHIELD

## 2015-11-14 DIAGNOSIS — C3411 Malignant neoplasm of upper lobe, right bronchus or lung: Secondary | ICD-10-CM | POA: Diagnosis not present

## 2015-11-14 LAB — HEPATIC FUNCTION PANEL
ALBUMIN: 3.9 g/dL (ref 3.5–5.0)
ALT: 464 U/L — ABNORMAL HIGH (ref 17–63)
AST: 421 U/L — AB (ref 15–41)
Alkaline Phosphatase: 52 U/L (ref 38–126)
BILIRUBIN DIRECT: 0.2 mg/dL (ref 0.1–0.5)
BILIRUBIN TOTAL: 0.6 mg/dL (ref 0.3–1.2)
Indirect Bilirubin: 0.4 mg/dL (ref 0.3–0.9)
Total Protein: 7.7 g/dL (ref 6.5–8.1)

## 2015-11-16 ENCOUNTER — Telehealth: Payer: Self-pay

## 2015-11-16 ENCOUNTER — Other Ambulatory Visit: Payer: Self-pay | Admitting: Internal Medicine

## 2015-11-16 ENCOUNTER — Other Ambulatory Visit: Payer: Self-pay

## 2015-11-16 DIAGNOSIS — R945 Abnormal results of liver function studies: Principal | ICD-10-CM

## 2015-11-16 DIAGNOSIS — R7989 Other specified abnormal findings of blood chemistry: Secondary | ICD-10-CM

## 2015-11-16 MED ORDER — PREDNISONE 20 MG PO TABS: ORAL_TABLET | ORAL | 0 refills | Status: DC

## 2015-11-16 NOTE — Telephone Encounter (Signed)
Erroneous note encounter  

## 2015-11-16 NOTE — Progress Notes (Unsigned)
Called Pt pt per Dr. Jacinto Reap.  Asked if pt was still taking prednisone and pt reported that he is not currently taking prednisone.  Dr. B sent in new prescription for pt and pt verbalized he would pick it up today.  Advised pt to take as instructed on bottle and we have a lab appt set for pt on Friday at 2pm.  No other concerns noted and pt verbalized an understanding.

## 2015-11-18 ENCOUNTER — Inpatient Hospital Stay: Payer: BLUE CROSS/BLUE SHIELD

## 2015-11-21 ENCOUNTER — Inpatient Hospital Stay: Payer: BLUE CROSS/BLUE SHIELD

## 2015-11-21 DIAGNOSIS — R7989 Other specified abnormal findings of blood chemistry: Secondary | ICD-10-CM

## 2015-11-21 DIAGNOSIS — C3411 Malignant neoplasm of upper lobe, right bronchus or lung: Secondary | ICD-10-CM | POA: Diagnosis not present

## 2015-11-21 DIAGNOSIS — R945 Abnormal results of liver function studies: Principal | ICD-10-CM

## 2015-11-21 LAB — HEPATIC FUNCTION PANEL
ALT: 439 U/L — ABNORMAL HIGH (ref 17–63)
AST: 445 U/L — ABNORMAL HIGH (ref 15–41)
Albumin: 3.2 g/dL — ABNORMAL LOW (ref 3.5–5.0)
Alkaline Phosphatase: 48 U/L (ref 38–126)
BILIRUBIN DIRECT: 0.2 mg/dL (ref 0.1–0.5)
BILIRUBIN INDIRECT: 0.6 mg/dL (ref 0.3–0.9)
TOTAL PROTEIN: 6.9 g/dL (ref 6.5–8.1)
Total Bilirubin: 0.8 mg/dL (ref 0.3–1.2)

## 2015-11-25 ENCOUNTER — Inpatient Hospital Stay: Payer: BLUE CROSS/BLUE SHIELD

## 2015-11-25 ENCOUNTER — Inpatient Hospital Stay (HOSPITAL_BASED_OUTPATIENT_CLINIC_OR_DEPARTMENT_OTHER): Payer: BLUE CROSS/BLUE SHIELD | Admitting: Internal Medicine

## 2015-11-25 ENCOUNTER — Encounter: Payer: Self-pay | Admitting: Internal Medicine

## 2015-11-25 VITALS — BP 130/73 | HR 70 | Temp 97.0°F | Resp 16 | Ht 69.0 in | Wt 117.2 lb

## 2015-11-25 DIAGNOSIS — J439 Emphysema, unspecified: Secondary | ICD-10-CM

## 2015-11-25 DIAGNOSIS — R7989 Other specified abnormal findings of blood chemistry: Secondary | ICD-10-CM | POA: Diagnosis not present

## 2015-11-25 DIAGNOSIS — Z79899 Other long term (current) drug therapy: Secondary | ICD-10-CM

## 2015-11-25 DIAGNOSIS — C3411 Malignant neoplasm of upper lobe, right bronchus or lung: Secondary | ICD-10-CM

## 2015-11-25 DIAGNOSIS — M25511 Pain in right shoulder: Secondary | ICD-10-CM

## 2015-11-25 DIAGNOSIS — Z95828 Presence of other vascular implants and grafts: Secondary | ICD-10-CM

## 2015-11-25 DIAGNOSIS — R945 Abnormal results of liver function studies: Secondary | ICD-10-CM

## 2015-11-25 DIAGNOSIS — G8929 Other chronic pain: Secondary | ICD-10-CM

## 2015-11-25 DIAGNOSIS — M129 Arthropathy, unspecified: Secondary | ICD-10-CM

## 2015-11-25 DIAGNOSIS — F17210 Nicotine dependence, cigarettes, uncomplicated: Secondary | ICD-10-CM

## 2015-11-25 DIAGNOSIS — Z87442 Personal history of urinary calculi: Secondary | ICD-10-CM

## 2015-11-25 DIAGNOSIS — B1920 Unspecified viral hepatitis C without hepatic coma: Secondary | ICD-10-CM | POA: Diagnosis not present

## 2015-11-25 LAB — CBC WITH DIFFERENTIAL/PLATELET
BASOS ABS: 0 10*3/uL (ref 0–0.1)
BASOS PCT: 0 %
EOS ABS: 0 10*3/uL (ref 0–0.7)
Eosinophils Relative: 0 %
HEMATOCRIT: 36.3 % — AB (ref 40.0–52.0)
HEMOGLOBIN: 12.3 g/dL — AB (ref 13.0–18.0)
Lymphocytes Relative: 9 %
Lymphs Abs: 0.7 10*3/uL — ABNORMAL LOW (ref 1.0–3.6)
MCH: 30.9 pg (ref 26.0–34.0)
MCHC: 34 g/dL (ref 32.0–36.0)
MCV: 91 fL (ref 80.0–100.0)
MONOS PCT: 4 %
Monocytes Absolute: 0.3 10*3/uL (ref 0.2–1.0)
NEUTROS ABS: 6.7 10*3/uL — AB (ref 1.4–6.5)
NEUTROS PCT: 87 %
Platelets: 152 10*3/uL (ref 150–440)
RBC: 3.99 MIL/uL — AB (ref 4.40–5.90)
RDW: 13.4 % (ref 11.5–14.5)
WBC: 7.7 10*3/uL (ref 3.8–10.6)

## 2015-11-25 LAB — COMPREHENSIVE METABOLIC PANEL
ALK PHOS: 47 U/L (ref 38–126)
ALT: 273 U/L — AB (ref 17–63)
ANION GAP: 5 (ref 5–15)
AST: 180 U/L — ABNORMAL HIGH (ref 15–41)
Albumin: 3.5 g/dL (ref 3.5–5.0)
BUN: 21 mg/dL — ABNORMAL HIGH (ref 6–20)
CALCIUM: 8.3 mg/dL — AB (ref 8.9–10.3)
CO2: 25 mmol/L (ref 22–32)
CREATININE: 0.81 mg/dL (ref 0.61–1.24)
Chloride: 103 mmol/L (ref 101–111)
Glucose, Bld: 154 mg/dL — ABNORMAL HIGH (ref 65–99)
Potassium: 3.8 mmol/L (ref 3.5–5.1)
SODIUM: 133 mmol/L — AB (ref 135–145)
Total Bilirubin: 0.9 mg/dL (ref 0.3–1.2)
Total Protein: 7.1 g/dL (ref 6.5–8.1)

## 2015-11-25 MED ORDER — HEPARIN SOD (PORK) LOCK FLUSH 100 UNIT/ML IV SOLN
500.0000 [IU] | Freq: Once | INTRAVENOUS | Status: AC
  Administered 2015-11-25: 500 [IU] via INTRAVENOUS

## 2015-11-25 MED ORDER — HEPARIN SOD (PORK) LOCK FLUSH 100 UNIT/ML IV SOLN
INTRAVENOUS | Status: AC
  Filled 2015-11-25: qty 5

## 2015-11-25 NOTE — Progress Notes (Signed)
.[place] OFFICE PROGRESS NOTE  Patient Care Team: Tracie Harrier, MD as PCP - General (Internal Medicine) Mohammed Kindle, MD as Attending Physician (Pain Medicine)   SUMMARY OF ONCOLOGIC HISTORY:  Oncology History   # MARCH 2016- LUNG CA- RUL- ADENO CA [mol testing-NA sec to poor samples] STAGE III s/p chemo-RT; [SEP 2016-CT Bx for mol testing; neg for malignancy]; CT-29th  NOV 2016-  Stable/ improved right upper lobe lesion;   # RECURRENT- CT JAN 2016- Stable right upper lobe lung nodule ~ 2.5cm; but progressive RML ~1.8cm; Start Tecentriq q 3W x3 ; March 30th CT 2017- Improved Lung Lesions; RML- 1.6x1.0; RUL ~3cm.; AUG 2nd 2017-CT stable.   # Hepatitis C- follow up [facility]?   # Chronic pain- on oxycodone.jan 2017-Bone scan- neg.       Cancer of upper lobe of right lung ([place])   09/19/2015 Initial Diagnosis    Cancer of upper lobe of right lung ([place])        INTERVAL HISTORY:  Vague historian.  55 year old male patient with above history of  Adenocarcinoma/Recurrent currently on Tecentriq q 3W is here for follow-up/Patient's treatments on hold because of elevated LFTs. Patient on prednisone 40 twice a day for the last 1 week.  Denies drinking alcohol.; Patient's appetite is fair. Chronic shortness of breath. On a cough. No hemoptysis. No abdominal pain nausea vomiting. No diarrhea.  Complains of chronic pain in his right shoulder.   REVIEW OF SYSTEMS:  No headaches or vision changes. No tingling and numbness.  PAST MEDICAL HISTORY :  Past Medical History:  Diagnosis Date  . Arthritis   . Chicken pox   . Kidney stones   . Lung cancer ([place]) 2015    PAST SURGICAL HISTORY :   Past Surgical History:  Procedure Laterality Date  . amputation of index finger  2010  . HAND SURGERY Right 2010   ORIF of right index finder, 2nd carpometacarpal joint dislocation right hand, proximal right humerus  . ORIF FEMUR FRACTURE Right 2010  . SHOULDER SURGERY Right  2010   pins and rods in shoulder    FAMILY HISTORY :  History reviewed. No pertinent family history.  SOCIAL HISTORY:   Social History  Substance Use Topics  . Smoking status: Current Every Day Smoker    Packs/day: 2.00    Years: 20.00    Types: Cigarettes  . Smokeless tobacco: Never Used  . Alcohol use 0.0 oz/week    ALLERGIES:  has No Known Allergies.  MEDICATIONS:  Current Outpatient Prescriptions  Medication Sig Dispense Refill  . HYDROcodone-acetaminophen (NORCO) 5-325 MG tablet Take 1 tablet by mouth every 6 (six) hours as needed for severe pain. 12 tablet 0  . Oxycodone HCl 10 MG TABS Limit one half to one tablet by mouth per day or twice per day if tolerated 60 tablet 0  . predniSONE (DELTASONE) 20 MG tablet Take 2 pills every 12 hours with food. 60 tablet 0  . albuterol (PROVENTIL HFA;VENTOLIN HFA) 108 (90 Base) MCG/ACT inhaler Inhale 2 puffs into the lungs every 6 (six) hours as needed for wheezing or shortness of breath. (Patient not taking: Reported on 11/25/2015) 1 Inhaler 2   Current Facility-Administered Medications  Medication Dose Route Frequency Provider Last Rate Last Dose  . bupivacaine (PF) (MARCAINE) 0.25 % injection 30 mL  30 mL Other Once Mohammed Kindle, MD      . sodium chloride flush (NS) 0.9 % injection 20 mL  20 mL Other  Once Mohammed Kindle, MD      . triamcinolone acetonide [facility]) injection 40 mg  40 mg Other Once Mohammed Kindle, MD       Facility-Administered Medications Ordered in Other Visits  Medication Dose Route Frequency Provider Last Rate Last Dose  . sodium chloride flush (NS) 0.9 % injection 10 mL  10 mL Intracatheter PRN Cammie Sickle, MD   10 mL at 05/26/15 1300  . sodium chloride flush (NS) 0.9 % injection 10 mL  10 mL Intravenous PRN Cammie Sickle, MD   10 mL at 07/07/15 1425    PHYSICAL EXAMINATION: ECOG PERFORMANCE STATUS: 0 - Asymptomatic  BP 130/73 (BP Location: Left Arm, Patient Position: Sitting)   Pulse 70    Temp 97 F (36.1 C) (Tympanic)   Resp 16   Ht '5\' 9"'$  (1.753 m)   Wt 117 lb 3.2 oz (53.2 kg)   SpO2 99%   BMI 17.31 kg/m   Filed Weights   11/25/15 1434  Weight: 117 lb 3.2 oz (53.2 kg)    GENERAL:  Thin built/ moderately nourished; Alert, no distress and comfortable. He is alone.  EYES: no pallor or icterus OROPHARYNX: no thrush or ulceration; poor dentition  NECK: supple, no masses felt LYMPH:  no palpable lymphadenopathy in the cervical, axillary or inguinal regions LUNGS:  Decreased breath sounds bilaterally to auscultation. No wheeze or crackles HEART/CVS: regular rate & rhythm and no murmurs; No lower extremity edema ABDOMEN:abdomen soft, non-tender and normal bowel sounds Musculoskeletal:no cyanosis of digits and no clubbing  PSYCH: alert & oriented x 3 with fluent speech NEURO: no focal motor/sensory deficits SKIN:  no rashes or significant lesions  LABORATORY DATA:  I have reviewed the data as listed    Component Value Date/Time   NA 133 (L) 11/25/2015 1407   NA 136 07/27/2014 0856   K 3.8 11/25/2015 1407   K 3.9 07/27/2014 0856   CL 103 11/25/2015 1407   CL 104 07/27/2014 0856   CO2 25 11/25/2015 1407   CO2 27 07/27/2014 0856   GLUCOSE 154 (H) 11/25/2015 1407   GLUCOSE 151 (H) 07/27/2014 0856   BUN 21 (H) 11/25/2015 1407   BUN 10 07/27/2014 0856   CREATININE 0.81 11/25/2015 1407   CREATININE 0.86 07/27/2014 0856   CREATININE 0.77 07/06/2014   CALCIUM 8.3 (L) 11/25/2015 1407   CALCIUM 8.6 (L) 07/27/2014 0856   PROT 7.1 11/25/2015 1407   PROT 8.8 (H) 06/28/2014 1512   ALBUMIN 3.5 11/25/2015 1407   ALBUMIN 4.0 06/28/2014 1512   AST 180 (H) 11/25/2015 1407   AST 37 06/28/2014 1512   ALT 273 (H) 11/25/2015 1407   ALT 43 06/28/2014 1512   ALKPHOS 47 11/25/2015 1407   ALKPHOS 53 06/28/2014 1512   BILITOT 0.9 11/25/2015 1407   BILITOT 0.3 06/28/2014 1512   GFRNONAA >60 11/25/2015 1407   GFRNONAA >60 07/27/2014 0856   GFRAA >60 11/25/2015 1407   GFRAA >60  07/27/2014 0856    No results found for: SPEP, UPEP  Lab Results  Component Value Date   WBC 7.7 11/25/2015   NEUTROABS 6.7 (H) 11/25/2015   HGB 12.3 (L) 11/25/2015   HCT 36.3 (L) 11/25/2015   MCV 91.0 11/25/2015   PLT 152 11/25/2015      Chemistry      Component Value Date/Time   NA 133 (L) 11/25/2015 1407   NA 136 07/27/2014 0856   K 3.8 11/25/2015 1407   K 3.9 07/27/2014 0856  CL 103 11/25/2015 1407   CL 104 07/27/2014 0856   CO2 25 11/25/2015 1407   CO2 27 07/27/2014 0856   BUN 21 (H) 11/25/2015 1407   BUN 10 07/27/2014 0856   CREATININE 0.81 11/25/2015 1407   CREATININE 0.86 07/27/2014 0856   CREATININE 0.77 07/06/2014      Component Value Date/Time   CALCIUM 8.3 (L) 11/25/2015 1407   CALCIUM 8.6 (L) 07/27/2014 0856   ALKPHOS 47 11/25/2015 1407   ALKPHOS 53 06/28/2014 1512   AST 180 (H) 11/25/2015 1407   AST 37 06/28/2014 1512   ALT 273 (H) 11/25/2015 1407   ALT 43 06/28/2014 1512   BILITOT 0.9 11/25/2015 1407   BILITOT 0.3 06/28/2014 1512     IMPRESSION: 1. Right upper lobe nodule along the major fissure is stable to a trace bit smaller on the current exam. 2. No substantial change in the pleural-based lateral right apical lesion. 3. No new or progressive findings on today's study. 4. The patchy areas of ground-glass nodularity seen distributed to both lungs on the previous exam have resolved in the interval 5. Emphysema.   Electronically Signed   By: Misty Stanley M.D.   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   # HOLD tecentriq sec to elevated LFTs.   # LFT elevation- AST/ALT ~ 400s ?/improving.  Related to Tecentriq. Denies alcohol. Continue prednsione '40mg'$  BID;slowly taper based on reponse. Planning to [facility] for hepC work up.   # Chronic pain neck pain/right  shoulder pain-follow up with pain clinic.   follow up with me in 4 weeks/ labs; weekly labs.    Patient will follow-up with me in approximately 3 Tecentriq infusion/labs.  Cammie Sickle, MD 11/25/2015 4:36 PM

## 2015-11-25 NOTE — Progress Notes (Signed)
No other concerns.

## 2015-11-25 NOTE — Assessment & Plan Note (Addendum)
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 Last Tecentriq on July 12th.   # HOLD tecentriq sec to elevated LFTs.   # LFT elevation- AST/ALT ~ 400s ?/improving.  Related to Tecentriq. Denies alcohol. Continue prednsione '40mg'$  BID;slowly taper based on reponse. Planning to [facility] for hepC work up.   # Chronic pain neck pain/right shoulder pain-follow up with pain clinic.   follow up with me in 4 weeks/ labs; weekly labs.

## 2015-12-02 ENCOUNTER — Inpatient Hospital Stay: Payer: BLUE CROSS/BLUE SHIELD | Attending: Internal Medicine

## 2015-12-02 DIAGNOSIS — Z9221 Personal history of antineoplastic chemotherapy: Secondary | ICD-10-CM | POA: Insufficient documentation

## 2015-12-02 DIAGNOSIS — B1920 Unspecified viral hepatitis C without hepatic coma: Secondary | ICD-10-CM | POA: Insufficient documentation

## 2015-12-02 DIAGNOSIS — F17210 Nicotine dependence, cigarettes, uncomplicated: Secondary | ICD-10-CM | POA: Insufficient documentation

## 2015-12-02 DIAGNOSIS — M25511 Pain in right shoulder: Secondary | ICD-10-CM | POA: Insufficient documentation

## 2015-12-02 DIAGNOSIS — C3411 Malignant neoplasm of upper lobe, right bronchus or lung: Secondary | ICD-10-CM | POA: Insufficient documentation

## 2015-12-02 DIAGNOSIS — R7989 Other specified abnormal findings of blood chemistry: Secondary | ICD-10-CM | POA: Insufficient documentation

## 2015-12-02 DIAGNOSIS — R252 Cramp and spasm: Secondary | ICD-10-CM | POA: Insufficient documentation

## 2015-12-02 DIAGNOSIS — G8929 Other chronic pain: Secondary | ICD-10-CM | POA: Insufficient documentation

## 2015-12-02 DIAGNOSIS — Z87442 Personal history of urinary calculi: Secondary | ICD-10-CM | POA: Insufficient documentation

## 2015-12-02 DIAGNOSIS — M129 Arthropathy, unspecified: Secondary | ICD-10-CM | POA: Insufficient documentation

## 2015-12-02 DIAGNOSIS — Z923 Personal history of irradiation: Secondary | ICD-10-CM | POA: Insufficient documentation

## 2015-12-02 DIAGNOSIS — J439 Emphysema, unspecified: Secondary | ICD-10-CM | POA: Insufficient documentation

## 2015-12-09 ENCOUNTER — Inpatient Hospital Stay: Payer: BLUE CROSS/BLUE SHIELD

## 2015-12-09 DIAGNOSIS — R7989 Other specified abnormal findings of blood chemistry: Secondary | ICD-10-CM

## 2015-12-09 DIAGNOSIS — M129 Arthropathy, unspecified: Secondary | ICD-10-CM | POA: Diagnosis not present

## 2015-12-09 DIAGNOSIS — R252 Cramp and spasm: Secondary | ICD-10-CM | POA: Diagnosis not present

## 2015-12-09 DIAGNOSIS — Z87442 Personal history of urinary calculi: Secondary | ICD-10-CM | POA: Diagnosis not present

## 2015-12-09 DIAGNOSIS — J439 Emphysema, unspecified: Secondary | ICD-10-CM | POA: Diagnosis not present

## 2015-12-09 DIAGNOSIS — B1920 Unspecified viral hepatitis C without hepatic coma: Secondary | ICD-10-CM | POA: Diagnosis not present

## 2015-12-09 DIAGNOSIS — F17210 Nicotine dependence, cigarettes, uncomplicated: Secondary | ICD-10-CM | POA: Diagnosis not present

## 2015-12-09 DIAGNOSIS — Z923 Personal history of irradiation: Secondary | ICD-10-CM | POA: Diagnosis not present

## 2015-12-09 DIAGNOSIS — G8929 Other chronic pain: Secondary | ICD-10-CM | POA: Diagnosis not present

## 2015-12-09 DIAGNOSIS — Z9221 Personal history of antineoplastic chemotherapy: Secondary | ICD-10-CM | POA: Diagnosis not present

## 2015-12-09 DIAGNOSIS — M25511 Pain in right shoulder: Secondary | ICD-10-CM | POA: Diagnosis not present

## 2015-12-09 DIAGNOSIS — R945 Abnormal results of liver function studies: Secondary | ICD-10-CM

## 2015-12-09 DIAGNOSIS — C3411 Malignant neoplasm of upper lobe, right bronchus or lung: Secondary | ICD-10-CM | POA: Diagnosis present

## 2015-12-09 LAB — HEPATIC FUNCTION PANEL
ALT: 261 U/L — AB (ref 17–63)
AST: 193 U/L — AB (ref 15–41)
Albumin: 4 g/dL (ref 3.5–5.0)
Alkaline Phosphatase: 52 U/L (ref 38–126)
BILIRUBIN INDIRECT: 0.8 mg/dL (ref 0.3–0.9)
Bilirubin, Direct: 0.3 mg/dL (ref 0.1–0.5)
TOTAL PROTEIN: 8.1 g/dL (ref 6.5–8.1)
Total Bilirubin: 1.1 mg/dL (ref 0.3–1.2)

## 2015-12-15 ENCOUNTER — Telehealth: Payer: Self-pay

## 2015-12-15 NOTE — Telephone Encounter (Signed)
FYI  Called pt back again to confirm pt was taking the -Recommended prednisone 40 mg BID [prednisone- 4 pills a day with food]. Please confirm with the patient the dose of prednisone/and treatment if needed. Pt verbalized that NO he is not because it makes his hands cramp to bad and he is taking one 20 mg prednisone a day and will see [place] tomorrow in clinic.  No other concerns noted.

## 2015-12-16 ENCOUNTER — Other Ambulatory Visit: Payer: Self-pay | Admitting: Internal Medicine

## 2015-12-16 ENCOUNTER — Inpatient Hospital Stay: Payer: BLUE CROSS/BLUE SHIELD

## 2015-12-16 ENCOUNTER — Inpatient Hospital Stay: Payer: BLUE CROSS/BLUE SHIELD | Admitting: Internal Medicine

## 2015-12-16 ENCOUNTER — Other Ambulatory Visit: Payer: Self-pay

## 2015-12-16 ENCOUNTER — Telehealth: Payer: Self-pay | Admitting: Internal Medicine

## 2015-12-16 DIAGNOSIS — R7989 Other specified abnormal findings of blood chemistry: Secondary | ICD-10-CM

## 2015-12-16 DIAGNOSIS — C3411 Malignant neoplasm of upper lobe, right bronchus or lung: Secondary | ICD-10-CM | POA: Diagnosis not present

## 2015-12-16 DIAGNOSIS — R945 Abnormal results of liver function studies: Principal | ICD-10-CM

## 2015-12-16 LAB — COMPREHENSIVE METABOLIC PANEL
ALBUMIN: 3.5 g/dL (ref 3.5–5.0)
ALK PHOS: 52 U/L (ref 38–126)
ALT: 314 U/L — AB (ref 17–63)
ANION GAP: 4 — AB (ref 5–15)
AST: 386 U/L — ABNORMAL HIGH (ref 15–41)
BILIRUBIN TOTAL: 1.1 mg/dL (ref 0.3–1.2)
BUN: 13 mg/dL (ref 6–20)
CALCIUM: 8.5 mg/dL — AB (ref 8.9–10.3)
CO2: 23 mmol/L (ref 22–32)
CREATININE: 0.87 mg/dL (ref 0.61–1.24)
Chloride: 108 mmol/L (ref 101–111)
GFR calc non Af Amer: >60 mL/min (ref >60)
GLUCOSE: 103 mg/dL — AB (ref 65–99)
Potassium: 4 mmol/L (ref 3.5–5.1)
Sodium: 135 mmol/L (ref 135–145)
TOTAL PROTEIN: 6.9 g/dL (ref 6.5–8.1)

## 2015-12-16 LAB — CBC WITH DIFFERENTIAL/PLATELET
Basophils Absolute: 0 10*3/uL (ref 0–0.1)
Basophils Relative: 1 %
EOS PCT: 1 %
Eosinophils Absolute: 0 10*3/uL (ref 0–0.7)
HEMATOCRIT: 38.5 % — AB (ref 40.0–52.0)
Hemoglobin: 12.9 g/dL — ABNORMAL LOW (ref 13.0–18.0)
LYMPHS ABS: 1.8 10*3/uL (ref 1.0–3.6)
LYMPHS PCT: 38 %
MCH: 30.4 pg (ref 26.0–34.0)
MCHC: 33.5 g/dL (ref 32.0–36.0)
MCV: 90.6 fL (ref 80.0–100.0)
MONO ABS: 0.8 10*3/uL (ref 0.2–1.0)
Monocytes Relative: 17 %
Neutro Abs: 2 10*3/uL (ref 1.4–6.5)
Neutrophils Relative %: 43 %
PLATELETS: 138 10*3/uL — AB (ref 150–440)
RBC: 4.25 MIL/uL — ABNORMAL LOW (ref 4.40–5.90)
RDW: 13.8 % (ref 11.5–14.5)
WBC: 4.6 10*3/uL (ref 3.8–10.6)

## 2015-12-16 NOTE — Telephone Encounter (Signed)
Spoke to patient- regarding elevated LFTs; he states to me that he is taking prednisone40 twice a day [4 pills a day];owever as per recent RN documentation/talk with the patient- he has been taking prednisone only one a day. Question noncompliance.  # again confirmed with the patient to take 40 twice a day. He has enough prescription. He is asymptomatic.  # schedule Ultrasound of liver asap; and weekly LFts.

## 2015-12-18 ENCOUNTER — Other Ambulatory Visit: Payer: Self-pay | Admitting: Pain Medicine

## 2015-12-19 NOTE — Telephone Encounter (Signed)
msg sent to sch. Team to sch. U/s before md apt on 12/23/15.

## 2015-12-20 ENCOUNTER — Other Ambulatory Visit: Payer: Self-pay | Admitting: Pain Medicine

## 2015-12-21 ENCOUNTER — Ambulatory Visit: Payer: BLUE CROSS/BLUE SHIELD

## 2015-12-23 ENCOUNTER — Encounter: Payer: Self-pay | Admitting: *Deleted

## 2015-12-23 ENCOUNTER — Inpatient Hospital Stay: Payer: BLUE CROSS/BLUE SHIELD | Admitting: Internal Medicine

## 2015-12-23 ENCOUNTER — Telehealth: Payer: Self-pay | Admitting: *Deleted

## 2015-12-23 ENCOUNTER — Inpatient Hospital Stay: Payer: BLUE CROSS/BLUE SHIELD

## 2015-12-23 NOTE — Telephone Encounter (Signed)
Erroneous note encounter  

## 2015-12-23 NOTE — Telephone Encounter (Signed)
Per v/o Dr. Rogue Bussing-  Contacted patient. Left vm. Strongly encouraging patient contact our office to reschedule his appointments. Pt has missed his appointments with xray and with md. He refuses to take his oral steriods per previous documentation.  Per md, asked RN to send letter stating to contact our office asap to r/s his apt.s

## 2015-12-23 NOTE — Telephone Encounter (Signed)
-----   Message from [place] sent at 12/23/2015  3:34 PM EDT ----- Regarding: no show Patient was a no show today and also no showed for his [place], I'm not going to reschedule to the appt right now bc the patient told me he wasn't going to come anyway. I called and left him a message to call back if he wanted to reschedule.  Thanks!

## 2015-12-29 ENCOUNTER — Ambulatory Visit
Admission: RE | Admit: 2015-12-29 | Discharge: 2015-12-29 | Disposition: A | Discharge status: Home / Self Care | Payer: BLUE CROSS/BLUE SHIELD | Source: Ambulatory Visit | Attending: Internal Medicine | Admitting: Internal Medicine

## 2015-12-29 DIAGNOSIS — R7989 Other specified abnormal findings of blood chemistry: Secondary | ICD-10-CM | POA: Insufficient documentation

## 2015-12-29 DIAGNOSIS — N1330 Unspecified hydronephrosis: Secondary | ICD-10-CM | POA: Diagnosis not present

## 2015-12-29 DIAGNOSIS — R945 Abnormal results of liver function studies: Secondary | ICD-10-CM

## 2015-12-30 ENCOUNTER — Inpatient Hospital Stay (HOSPITAL_BASED_OUTPATIENT_CLINIC_OR_DEPARTMENT_OTHER): Payer: BLUE CROSS/BLUE SHIELD | Admitting: Internal Medicine

## 2015-12-30 ENCOUNTER — Encounter: Payer: Self-pay | Admitting: Internal Medicine

## 2015-12-30 ENCOUNTER — Inpatient Hospital Stay: Payer: BLUE CROSS/BLUE SHIELD

## 2015-12-30 DIAGNOSIS — C3411 Malignant neoplasm of upper lobe, right bronchus or lung: Secondary | ICD-10-CM

## 2015-12-30 DIAGNOSIS — F17210 Nicotine dependence, cigarettes, uncomplicated: Secondary | ICD-10-CM

## 2015-12-30 DIAGNOSIS — M129 Arthropathy, unspecified: Secondary | ICD-10-CM

## 2015-12-30 DIAGNOSIS — Z87442 Personal history of urinary calculi: Secondary | ICD-10-CM

## 2015-12-30 DIAGNOSIS — M25511 Pain in right shoulder: Secondary | ICD-10-CM

## 2015-12-30 DIAGNOSIS — Z923 Personal history of irradiation: Secondary | ICD-10-CM

## 2015-12-30 DIAGNOSIS — R7989 Other specified abnormal findings of blood chemistry: Secondary | ICD-10-CM | POA: Diagnosis not present

## 2015-12-30 DIAGNOSIS — G8929 Other chronic pain: Secondary | ICD-10-CM

## 2015-12-30 DIAGNOSIS — B1920 Unspecified viral hepatitis C without hepatic coma: Secondary | ICD-10-CM | POA: Diagnosis not present

## 2015-12-30 DIAGNOSIS — R252 Cramp and spasm: Secondary | ICD-10-CM

## 2015-12-30 DIAGNOSIS — Z9221 Personal history of antineoplastic chemotherapy: Secondary | ICD-10-CM

## 2015-12-30 DIAGNOSIS — J439 Emphysema, unspecified: Secondary | ICD-10-CM

## 2015-12-30 DIAGNOSIS — R945 Abnormal results of liver function studies: Secondary | ICD-10-CM

## 2015-12-30 LAB — HEPATIC FUNCTION PANEL
ALBUMIN: 3.3 g/dL — AB (ref 3.5–5.0)
ALT: 154 U/L — AB (ref 17–63)
AST: 142 U/L — AB (ref 15–41)
Alkaline Phosphatase: 52 U/L (ref 38–126)
Bilirubin, Direct: 0.1 mg/dL (ref 0.1–0.5)
Indirect Bilirubin: 0.6 mg/dL (ref 0.3–0.9)
TOTAL PROTEIN: 7.1 g/dL (ref 6.5–8.1)
Total Bilirubin: 0.7 mg/dL (ref 0.3–1.2)

## 2015-12-30 NOTE — Progress Notes (Signed)
Medication concerns related to hand cramping while taking prednisone

## 2015-12-30 NOTE — Progress Notes (Signed)
.[place] OFFICE PROGRESS NOTE  Patient Care Team: Tracie Harrier, MD as PCP - General (Internal Medicine) Mohammed Kindle, MD as Attending Physician (Pain Medicine)   SUMMARY OF ONCOLOGIC HISTORY:  Oncology History   # MARCH 2016- LUNG CA- RUL- ADENO CA [mol testing-NA sec to poor samples] STAGE III s/p chemo-RT; [SEP 2016-CT Bx for mol testing; neg for malignancy]; CT-29th  NOV 2016-  Stable/ improved right upper lobe lesion;   # RECURRENT- CT JAN 2016- Stable right upper lobe lung nodule ~ 2.5cm; but progressive RML ~1.8cm; Start Tecentriq q 3W x3 ; March 30th CT 2017- Improved Lung Lesions; RML- 1.6x1.0; RUL ~3cm.; AUG 2nd 2017-CT stable.   # Hepatitis C- follow up [facility]?   # Chronic pain- on oxycodone.jan 2017-Bone scan- neg.       Cancer of upper lobe of right lung ([place])   09/19/2015 Initial Diagnosis    Cancer of upper lobe of right lung ([place])        INTERVAL HISTORY:  Vague historian.  55 year old male patient with above history of  Adenocarcinoma/Recurrent currently on Tecentriq q 3W is here for follow-up/Patient's treatments on hold because of elevated LFTs. Patient on prednisone 40 twice a day for the last 4 weeks.  Denies any nausea vomiting. Denies abdominal pain. Complains of cramping in his hands after the prednisone in the morning.; He states his difficulty sleeping at night. There have been concerns of compliance with his steroids.  Complains of chronic pain in his right shoulder.   REVIEW OF SYSTEMS:  No headaches or vision changes. No tingling and numbness.  PAST MEDICAL HISTORY :  Past Medical History:  Diagnosis Date  . Arthritis   . Chicken pox   . Kidney stones   . Lung cancer ([place]) 2015    PAST SURGICAL HISTORY :   Past Surgical History:  Procedure Laterality Date  . amputation of index finger  2010  . HAND SURGERY Right 2010   ORIF of right index finder, 2nd carpometacarpal joint dislocation right hand, proximal right  humerus  . ORIF FEMUR FRACTURE Right 2010  . SHOULDER SURGERY Right 2010   pins and rods in shoulder    FAMILY HISTORY :  History reviewed. No pertinent family history.  SOCIAL HISTORY:   Social History  Substance Use Topics  . Smoking status: Current Every Day Smoker    Packs/day: 2.00    Years: 20.00    Types: Cigarettes  . Smokeless tobacco: Never Used  . Alcohol use 0.0 oz/week    ALLERGIES:  has No Known Allergies.  MEDICATIONS:  Current Outpatient Prescriptions  Medication Sig Dispense Refill  . albuterol (PROVENTIL HFA;VENTOLIN HFA) 108 (90 Base) MCG/ACT inhaler Inhale 2 puffs into the lungs every 6 (six) hours as needed for wheezing or shortness of breath. 1 Inhaler 2  . HYDROcodone-acetaminophen (NORCO) 5-325 MG tablet Take 1 tablet by mouth every 6 (six) hours as needed for severe pain. 12 tablet 0  . Oxycodone HCl 10 MG TABS Limit one half to one tablet by mouth per day or twice per day if tolerated 60 tablet 0  . predniSONE (DELTASONE) 20 MG tablet Take 2 pills every 12 hours with food. (Patient not taking: Reported on 12/30/2015) 60 tablet 0   Current Facility-Administered Medications  Medication Dose Route Frequency Provider Last Rate Last Dose  . bupivacaine (PF) (MARCAINE) 0.25 % injection 30 mL  30 mL Other Once Mohammed Kindle, MD      . sodium  chloride flush (NS) 0.9 % injection 20 mL  20 mL Other Once Mohammed Kindle, MD      . triamcinolone acetonide [facility]) injection 40 mg  40 mg Other Once Mohammed Kindle, MD       Facility-Administered Medications Ordered in Other Visits  Medication Dose Route Frequency Provider Last Rate Last Dose  . sodium chloride flush (NS) 0.9 % injection 10 mL  10 mL Intracatheter PRN Cammie Sickle, MD   10 mL at 05/26/15 1300  . sodium chloride flush (NS) 0.9 % injection 10 mL  10 mL Intravenous PRN Cammie Sickle, MD   10 mL at 07/07/15 1425    PHYSICAL EXAMINATION: ECOG PERFORMANCE STATUS: 0 - Asymptomatic  BP  (!) 145/75 (BP Location: Left Arm, Patient Position: Sitting)   Pulse 71   Temp 97.3 F (36.3 C) (Tympanic)   Resp 16   Ht '5\' 9"'$  (1.753 m)   Wt 115 lb 12.8 oz (52.5 kg)   BMI 17.10 kg/m   Filed Weights   12/30/15 1449  Weight: 115 lb 12.8 oz (52.5 kg)    GENERAL:  Thin built/ moderately nourished; Alert, no distress and comfortable. He is alone.  EYES: no pallor or icterus OROPHARYNX: no thrush or ulceration; poor dentition  NECK: supple, no masses felt LYMPH:  no palpable lymphadenopathy in the cervical, axillary or inguinal regions LUNGS:  Decreased breath sounds bilaterally to auscultation. No wheeze or crackles HEART/CVS: regular rate & rhythm and no murmurs; No lower extremity edema ABDOMEN:abdomen soft, non-tender and normal bowel sounds Musculoskeletal:no cyanosis of digits and no clubbing  PSYCH: alert & oriented x 3 with fluent speech NEURO: no focal motor/sensory deficits SKIN:  no rashes or significant lesions  LABORATORY DATA:  I have reviewed the data as listed    Component Value Date/Time   NA 135 12/16/2015 1413   NA 136 07/27/2014 0856   K 4.0 12/16/2015 1413   K 3.9 07/27/2014 0856   CL 108 12/16/2015 1413   CL 104 07/27/2014 0856   CO2 23 12/16/2015 1413   CO2 27 07/27/2014 0856   GLUCOSE 103 (H) 12/16/2015 1413   GLUCOSE 151 (H) 07/27/2014 0856   BUN 13 12/16/2015 1413   BUN 10 07/27/2014 0856   CREATININE 0.87 12/16/2015 1413   CREATININE 0.86 07/27/2014 0856   CREATININE 0.77 07/06/2014   CALCIUM 8.5 (L) 12/16/2015 1413   CALCIUM 8.6 (L) 07/27/2014 0856   PROT 7.1 12/30/2015 1430   PROT 8.8 (H) 06/28/2014 1512   ALBUMIN 3.3 (L) 12/30/2015 1430   ALBUMIN 4.0 06/28/2014 1512   AST 142 (H) 12/30/2015 1430   AST 37 06/28/2014 1512   ALT 154 (H) 12/30/2015 1430   ALT 43 06/28/2014 1512   ALKPHOS 52 12/30/2015 1430   ALKPHOS 53 06/28/2014 1512   BILITOT 0.7 12/30/2015 1430   BILITOT 0.3 06/28/2014 1512   GFRNONAA >60 12/16/2015 1413    GFRNONAA >60 07/27/2014 0856   GFRAA >60 12/16/2015 1413   GFRAA >60 07/27/2014 0856    No results found for: SPEP, UPEP  Lab Results  Component Value Date   WBC 4.6 12/16/2015   NEUTROABS 2.0 12/16/2015   HGB 12.9 (L) 12/16/2015   HCT 38.5 (L) 12/16/2015   MCV 90.6 12/16/2015   PLT 138 (L) 12/16/2015      Chemistry      Component Value Date/Time   NA 135 12/16/2015 1413   NA 136 07/27/2014 0856   K 4.0 12/16/2015 1413  K 3.9 07/27/2014 0856   CL 108 12/16/2015 1413   CL 104 07/27/2014 0856   CO2 23 12/16/2015 1413   CO2 27 07/27/2014 0856   BUN 13 12/16/2015 1413   BUN 10 07/27/2014 0856   CREATININE 0.87 12/16/2015 1413   CREATININE 0.86 07/27/2014 0856   CREATININE 0.77 07/06/2014      Component Value Date/Time   CALCIUM 8.5 (L) 12/16/2015 1413   CALCIUM 8.6 (L) 07/27/2014 0856   ALKPHOS 52 12/30/2015 1430   ALKPHOS 53 06/28/2014 1512   AST 142 (H) 12/30/2015 1430   AST 37 06/28/2014 1512   ALT 154 (H) 12/30/2015 1430   ALT 43 06/28/2014 1512   BILITOT 0.7 12/30/2015 1430   BILITOT 0.3 06/28/2014 1512     IMPRESSION: 1. Right upper lobe nodule along the major fissure is stable to a trace bit smaller on the current exam. 2. No substantial change in the pleural-based lateral right apical lesion. 3. No new or progressive findings on today's study. 4. The patchy areas of ground-glass nodularity seen distributed to both lungs on the previous exam have resolved in the interval 5. Emphysema.   Electronically Signed   By: Misty Stanley M.D.   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 Last Tecentriq on July 12th.  # HOLD tecentriq sec to elevated LFTs.   # LFT elevation- AST/ALT ~ 200s ?/improving.  Related to Tecentriq. Decrease the dose of prednisone to 40 mg in mid afternoon  [cramping in am & insomnia at high].  Slowly taper based on reponse. Planning to [facility] for hepC work up.   follow up with me in 3 weeks/ labs; weekly labs.    Patient will follow-up with me in approximately 3 Tecentriq infusion/labs.  Cammie Sickle, MD 12/30/2015 5:50 PM

## 2015-12-30 NOTE — Assessment & Plan Note (Addendum)
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 Last Tecentriq on July 12th.  # HOLD tecentriq sec to elevated LFTs.   # LFT elevation- AST/ALT ~ 200s ?/improving.  Related to Tecentriq. Decrease the dose of prednisone to 40 mg in mid afternoon [cramping in am & insomnia at high].  Slowly taper based on reponse. Planning to [facility] for hepC work up.   follow up with me in 3 weeks/ labs; weekly labs.

## 2016-01-06 ENCOUNTER — Inpatient Hospital Stay: Payer: BLUE CROSS/BLUE SHIELD | Attending: Internal Medicine

## 2016-01-06 DIAGNOSIS — C3411 Malignant neoplasm of upper lobe, right bronchus or lung: Secondary | ICD-10-CM | POA: Insufficient documentation

## 2016-01-08 ENCOUNTER — Other Ambulatory Visit: Payer: Self-pay | Admitting: Pain Medicine

## 2016-01-13 ENCOUNTER — Inpatient Hospital Stay: Payer: BLUE CROSS/BLUE SHIELD

## 2016-01-13 ENCOUNTER — Telehealth: Payer: Self-pay | Admitting: *Deleted

## 2016-01-13 DIAGNOSIS — R945 Abnormal results of liver function studies: Secondary | ICD-10-CM

## 2016-01-13 DIAGNOSIS — C3411 Malignant neoplasm of upper lobe, right bronchus or lung: Secondary | ICD-10-CM | POA: Diagnosis present

## 2016-01-13 DIAGNOSIS — R7989 Other specified abnormal findings of blood chemistry: Secondary | ICD-10-CM

## 2016-01-13 LAB — HEPATIC FUNCTION PANEL
ALT: 241 U/L — ABNORMAL HIGH (ref 17–63)
AST: 170 U/L — AB (ref 15–41)
Albumin: 3.4 g/dL — ABNORMAL LOW (ref 3.5–5.0)
Alkaline Phosphatase: 51 U/L (ref 38–126)
BILIRUBIN DIRECT: 0.2 mg/dL (ref 0.1–0.5)
BILIRUBIN TOTAL: 0.8 mg/dL (ref 0.3–1.2)
Indirect Bilirubin: 0.6 mg/dL (ref 0.3–0.9)
Total Protein: 7.2 g/dL (ref 6.5–8.1)

## 2016-01-13 NOTE — Telephone Encounter (Signed)
Call attempt. Pt not accepting phone calls.

## 2016-01-13 NOTE — Telephone Encounter (Signed)
-----   Message from Cammie Sickle, MD sent at 01/13/2016  2:42 PM EDT ----- LFts still slightly elevated; first confirm the dose of prednisone with pt [he should be taking 40 BID] continue prednisone at this time; follow up as planned.

## 2016-01-16 NOTE — Telephone Encounter (Signed)
Patient will not accept phone calls from RN on voice mail.

## 2016-01-20 ENCOUNTER — Inpatient Hospital Stay: Payer: BLUE CROSS/BLUE SHIELD

## 2016-01-20 ENCOUNTER — Inpatient Hospital Stay: Payer: BLUE CROSS/BLUE SHIELD | Admitting: Internal Medicine

## 2016-02-03 ENCOUNTER — Inpatient Hospital Stay: Payer: BLUE CROSS/BLUE SHIELD | Attending: Internal Medicine

## 2016-02-03 ENCOUNTER — Inpatient Hospital Stay (HOSPITAL_BASED_OUTPATIENT_CLINIC_OR_DEPARTMENT_OTHER): Payer: BLUE CROSS/BLUE SHIELD | Admitting: Internal Medicine

## 2016-02-03 VITALS — BP 127/71 | HR 71 | Temp 96.9°F | Resp 18 | Ht 69.0 in | Wt 118.4 lb

## 2016-02-03 DIAGNOSIS — Z87442 Personal history of urinary calculi: Secondary | ICD-10-CM | POA: Diagnosis not present

## 2016-02-03 DIAGNOSIS — Z7952 Long term (current) use of systemic steroids: Secondary | ICD-10-CM | POA: Diagnosis not present

## 2016-02-03 DIAGNOSIS — Z9221 Personal history of antineoplastic chemotherapy: Secondary | ICD-10-CM | POA: Diagnosis not present

## 2016-02-03 DIAGNOSIS — M25511 Pain in right shoulder: Secondary | ICD-10-CM

## 2016-02-03 DIAGNOSIS — Z923 Personal history of irradiation: Secondary | ICD-10-CM

## 2016-02-03 DIAGNOSIS — G8929 Other chronic pain: Secondary | ICD-10-CM | POA: Diagnosis not present

## 2016-02-03 DIAGNOSIS — R7989 Other specified abnormal findings of blood chemistry: Secondary | ICD-10-CM

## 2016-02-03 DIAGNOSIS — Z89021 Acquired absence of right finger(s): Secondary | ICD-10-CM

## 2016-02-03 DIAGNOSIS — M129 Arthropathy, unspecified: Secondary | ICD-10-CM

## 2016-02-03 DIAGNOSIS — R200 Anesthesia of skin: Secondary | ICD-10-CM

## 2016-02-03 DIAGNOSIS — R202 Paresthesia of skin: Secondary | ICD-10-CM | POA: Diagnosis not present

## 2016-02-03 DIAGNOSIS — Z8619 Personal history of other infectious and parasitic diseases: Secondary | ICD-10-CM | POA: Diagnosis not present

## 2016-02-03 DIAGNOSIS — C3411 Malignant neoplasm of upper lobe, right bronchus or lung: Secondary | ICD-10-CM | POA: Diagnosis not present

## 2016-02-03 DIAGNOSIS — F17210 Nicotine dependence, cigarettes, uncomplicated: Secondary | ICD-10-CM

## 2016-02-03 DIAGNOSIS — R945 Abnormal results of liver function studies: Secondary | ICD-10-CM

## 2016-02-03 LAB — COMPREHENSIVE METABOLIC PANEL
ALBUMIN: 3.6 g/dL (ref 3.5–5.0)
ALT: 171 U/L — AB (ref 17–63)
AST: 126 U/L — AB (ref 15–41)
Alkaline Phosphatase: 41 U/L (ref 38–126)
Anion gap: 6 (ref 5–15)
BUN: 13 mg/dL (ref 6–20)
CHLORIDE: 106 mmol/L (ref 101–111)
CO2: 26 mmol/L (ref 22–32)
CREATININE: 0.95 mg/dL (ref 0.61–1.24)
Calcium: 8.8 mg/dL — ABNORMAL LOW (ref 8.9–10.3)
GFR calc Af Amer: >60 mL/min (ref >60)
GLUCOSE: 88 mg/dL (ref 65–99)
POTASSIUM: 3.5 mmol/L (ref 3.5–5.1)
SODIUM: 138 mmol/L (ref 135–145)
Total Bilirubin: 0.2 mg/dL — ABNORMAL LOW (ref 0.3–1.2)
Total Protein: 7.3 g/dL (ref 6.5–8.1)

## 2016-02-03 LAB — CBC WITH DIFFERENTIAL/PLATELET
BASOS ABS: 0 10*3/uL (ref 0–0.1)
BASOS PCT: 0 %
EOS ABS: 0 10*3/uL (ref 0–0.7)
EOS PCT: 1 %
HCT: 40.4 % (ref 40.0–52.0)
Hemoglobin: 13.2 g/dL (ref 13.0–18.0)
LYMPHS PCT: 39 %
Lymphs Abs: 2.6 10*3/uL (ref 1.0–3.6)
MCH: 29.9 pg (ref 26.0–34.0)
MCHC: 32.6 g/dL (ref 32.0–36.0)
MCV: 91.7 fL (ref 80.0–100.0)
MONO ABS: 0.8 10*3/uL (ref 0.2–1.0)
Monocytes Relative: 12 %
Neutro Abs: 3.2 10*3/uL (ref 1.4–6.5)
Neutrophils Relative %: 48 %
PLATELETS: 158 10*3/uL (ref 150–440)
RBC: 4.4 MIL/uL (ref 4.40–5.90)
RDW: 13.2 % (ref 11.5–14.5)
WBC: 6.6 10*3/uL (ref 3.8–10.6)

## 2016-02-03 MED ORDER — PREDNISONE 20 MG PO TABS: ORAL_TABLET | ORAL | 0 refills | Status: DC

## 2016-02-03 NOTE — Progress Notes (Signed)
Pt states that over the last week his right hand and fingers were numb and tingling.  It is worse than it has been.  His rib is hurting on right side and it is normal for him. He does not have cough. He is eating and drinking and asked for boost and I gave him 3 boost and it had coupons on it.  He would like prednisone rx. He only had 3 pills left.

## 2016-02-03 NOTE — Assessment & Plan Note (Addendum)
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 Last Tecentriq on July 12th.  # HOLD tecentriq sec to elevated LFTs. Will repeat CT scan.  # LFT elevation- AST/ALT ~ 120/160ss ?/improving.  Related to Tecentriq.  Continue Prednisone 20 BID; taper based on LFTs response.   follow up with me in 4 weeks/ labs; weekly every 2 weeks; CT scan few days prior to visit.

## 2016-02-03 NOTE — Progress Notes (Signed)
.[place] OFFICE PROGRESS NOTE  Patient Care Team: Tracie Harrier, MD as PCP - General (Internal Medicine) Mohammed Kindle, MD as Attending Physician (Pain Medicine)   SUMMARY OF ONCOLOGIC HISTORY:  Oncology History   # MARCH 2016- LUNG CA- RUL- ADENO CA [mol testing-NA sec to poor samples] STAGE III s/p chemo-RT; [SEP 2016-CT Bx for mol testing; neg for malignancy]; CT-29th  NOV 2016-  Stable/ improved right upper lobe lesion;   # RECURRENT- CT JAN 2016- Stable right upper lobe lung nodule ~ 2.5cm; but progressive RML ~1.8cm; Start Tecentriq q 3W x3 ; March 30th CT 2017- Improved Lung Lesions; RML- 1.6x1.0; RUL ~3cm.; AUG 2nd 2017-CT stable.   # Hepatitis C- follow up [facility]?   # Chronic pain- on oxycodone.jan 2017-Bone scan- neg.       Cancer of upper lobe of right lung ([place])   09/19/2015 Initial Diagnosis    Cancer of upper lobe of right lung ([place])        INTERVAL HISTORY:  Vague historian.  55 year old male patient with above history of  Adenocarcinoma/Recurrent currently on Tecentriq q 3W; on HOLD Because of elevated LFTs is here for follow-up. Patient continues treatment prednisone 20 mg twice a day. He states that he has been taking it on a regular basis.  Denies any nausea vomiting. Denies abdominal pain. Patient is chronic tingling and numbness of his right hand ; chronic right shoulder pain for which is on pain medications for pain management clinic. There have been concerns of compliance with his steroids.   REVIEW OF SYSTEMS:  No headaches or vision changes. A complete 10 point review of system is done which is negative for mentioned above in history of present illness.  PAST MEDICAL HISTORY :  Past Medical History:  Diagnosis Date  . Arthritis   . Chicken pox   . Kidney stones   . Lung cancer ([place]) 2015    PAST SURGICAL HISTORY :   Past Surgical History:  Procedure Laterality Date  . amputation of index finger  2010  . HAND SURGERY Right  2010   ORIF of right index finder, 2nd carpometacarpal joint dislocation right hand, proximal right humerus  . ORIF FEMUR FRACTURE Right 2010  . SHOULDER SURGERY Right 2010   pins and rods in shoulder    FAMILY HISTORY :  No family history on file.  SOCIAL HISTORY:   Social History  Substance Use Topics  . Smoking status: Current Every Day Smoker    Packs/day: 2.00    Years: 20.00    Types: Cigarettes  . Smokeless tobacco: Never Used  . Alcohol use 0.0 oz/week    ALLERGIES:  has No Known Allergies.  MEDICATIONS:  Current Outpatient Prescriptions  Medication Sig Dispense Refill  . Oxycodone HCl 10 MG TABS Limit one half to one tablet by mouth per day or twice per day if tolerated 60 tablet 0  . predniSONE (DELTASONE) 20 MG tablet Take 2 pills every 12 hours with food. 60 tablet 0   Current Facility-Administered Medications  Medication Dose Route Frequency Provider Last Rate Last Dose  . bupivacaine (PF) (MARCAINE) 0.25 % injection 30 mL  30 mL Other Once Mohammed Kindle, MD      . sodium chloride flush (NS) 0.9 % injection 20 mL  20 mL Other Once Mohammed Kindle, MD      . triamcinolone acetonide (KENALOG-40) injection 40 mg  40 mg Other Once Mohammed Kindle, MD  Facility-Administered Medications Ordered in Other Visits  Medication Dose Route Frequency Provider Last Rate Last Dose  . sodium chloride flush (NS) 0.9 % injection 10 mL  10 mL Intracatheter PRN Cammie Sickle, MD   10 mL at 05/26/15 1300  . sodium chloride flush (NS) 0.9 % injection 10 mL  10 mL Intravenous PRN Cammie Sickle, MD   10 mL at 07/07/15 1425    PHYSICAL EXAMINATION: ECOG PERFORMANCE STATUS: 0 - Asymptomatic  BP 127/71   Pulse 71   Temp (!) 96.9 F (36.1 C) (Tympanic)   Resp 18   Ht '5\' 9"'$  (1.753 m)   Wt 118 lb 6.4 oz (53.7 kg)   BMI 17.48 kg/m   Filed Weights   02/03/16 1551  Weight: 118 lb 6.4 oz (53.7 kg)    GENERAL:  Thin built/ moderately nourished; Alert, no distress and  comfortable. He is alone.  EYES: no pallor or icterus OROPHARYNX: no thrush or ulceration; poor dentition  NECK: supple, no masses felt LYMPH:  no palpable lymphadenopathy in the cervical, axillary or inguinal regions LUNGS:  Decreased breath sounds bilaterally to auscultation. No wheeze or crackles HEART/CVS: regular rate & rhythm and no murmurs; No lower extremity edema ABDOMEN:abdomen soft, non-tender and normal bowel sounds Musculoskeletal:no cyanosis of digits and no clubbing  PSYCH: alert & oriented x 3 with fluent speech NEURO: no focal motor/sensory deficits SKIN:  no rashes or significant lesions  LABORATORY DATA:  I have reviewed the data as listed    Component Value Date/Time   NA 138 02/03/2016 1510   NA 136 07/27/2014 0856   K 3.5 02/03/2016 1510   K 3.9 07/27/2014 0856   CL 106 02/03/2016 1510   CL 104 07/27/2014 0856   CO2 26 02/03/2016 1510   CO2 27 07/27/2014 0856   GLUCOSE 88 02/03/2016 1510   GLUCOSE 151 (H) 07/27/2014 0856   BUN 13 02/03/2016 1510   BUN 10 07/27/2014 0856   CREATININE 0.95 02/03/2016 1510   CREATININE 0.86 07/27/2014 0856   CREATININE 0.77 07/06/2014   CALCIUM 8.8 (L) 02/03/2016 1510   CALCIUM 8.6 (L) 07/27/2014 0856   PROT 7.3 02/03/2016 1510   PROT 8.8 (H) 06/28/2014 1512   ALBUMIN 3.6 02/03/2016 1510   ALBUMIN 4.0 06/28/2014 1512   AST 126 (H) 02/03/2016 1510   AST 37 06/28/2014 1512   ALT 171 (H) 02/03/2016 1510   ALT 43 06/28/2014 1512   ALKPHOS 41 02/03/2016 1510   ALKPHOS 53 06/28/2014 1512   BILITOT 0.2 (L) 02/03/2016 1510   BILITOT 0.3 06/28/2014 1512   GFRNONAA >60 02/03/2016 1510   GFRNONAA >60 07/27/2014 0856   GFRAA >60 02/03/2016 1510   GFRAA >60 07/27/2014 0856    No results found for: SPEP, UPEP  Lab Results  Component Value Date   WBC 6.6 02/03/2016   NEUTROABS 3.2 02/03/2016   HGB 13.2 02/03/2016   HCT 40.4 02/03/2016   MCV 91.7 02/03/2016   PLT 158 02/03/2016      Chemistry      Component Value  Date/Time   NA 138 02/03/2016 1510   NA 136 07/27/2014 0856   K 3.5 02/03/2016 1510   K 3.9 07/27/2014 0856   CL 106 02/03/2016 1510   CL 104 07/27/2014 0856   CO2 26 02/03/2016 1510   CO2 27 07/27/2014 0856   BUN 13 02/03/2016 1510   BUN 10 07/27/2014 0856   CREATININE 0.95 02/03/2016 1510   CREATININE 0.86 07/27/2014  1364   CREATININE 0.77 07/06/2014      Component Value Date/Time   CALCIUM 8.8 (L) 02/03/2016 1510   CALCIUM 8.6 (L) 07/27/2014 0856   ALKPHOS 41 02/03/2016 1510   ALKPHOS 53 06/28/2014 1512   AST 126 (H) 02/03/2016 1510   AST 37 06/28/2014 1512   ALT 171 (H) 02/03/2016 1510   ALT 43 06/28/2014 1512   BILITOT 0.2 (L) 02/03/2016 1510   BILITOT 0.3 06/28/2014 1512      ASSESSMENT & PLAN:   Cancer of upper lobe of right lung (HCC) 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 Last Tecentriq on July 12th.  # HOLD tecentriq sec to elevated LFTs. Will repeat CT scan.  # LFT elevation- AST/ALT ~ 120/160ss ?/improving.  Related to Tecentriq.  Continue Prednisone 20 BID; taper based on LFTs response.   follow up with me in 4 weeks/ labs; weekly every 2 weeks; CT scan few days prior to visit.    Cammie Sickle, MD 02/03/2016 5:14 PM

## 2016-02-17 ENCOUNTER — Inpatient Hospital Stay: Payer: BLUE CROSS/BLUE SHIELD

## 2016-03-02 ENCOUNTER — Ambulatory Visit: Payer: BLUE CROSS/BLUE SHIELD | Attending: Internal Medicine

## 2016-03-05 ENCOUNTER — Inpatient Hospital Stay: Payer: BLUE CROSS/BLUE SHIELD | Admitting: Internal Medicine

## 2016-03-05 ENCOUNTER — Encounter: Payer: Self-pay | Admitting: *Deleted

## 2016-03-05 ENCOUNTER — Inpatient Hospital Stay: Payer: BLUE CROSS/BLUE SHIELD | Attending: Internal Medicine

## 2016-03-05 DIAGNOSIS — M129 Arthropathy, unspecified: Secondary | ICD-10-CM | POA: Insufficient documentation

## 2016-03-05 DIAGNOSIS — F17210 Nicotine dependence, cigarettes, uncomplicated: Secondary | ICD-10-CM | POA: Diagnosis not present

## 2016-03-05 DIAGNOSIS — R948 Abnormal results of function studies of other organs and systems: Secondary | ICD-10-CM | POA: Insufficient documentation

## 2016-03-05 DIAGNOSIS — B1920 Unspecified viral hepatitis C without hepatic coma: Secondary | ICD-10-CM | POA: Diagnosis not present

## 2016-03-05 DIAGNOSIS — Z9221 Personal history of antineoplastic chemotherapy: Secondary | ICD-10-CM | POA: Insufficient documentation

## 2016-03-05 DIAGNOSIS — C3411 Malignant neoplasm of upper lobe, right bronchus or lung: Secondary | ICD-10-CM | POA: Diagnosis present

## 2016-03-05 DIAGNOSIS — Z8781 Personal history of (healed) traumatic fracture: Secondary | ICD-10-CM | POA: Insufficient documentation

## 2016-03-05 DIAGNOSIS — G8929 Other chronic pain: Secondary | ICD-10-CM | POA: Diagnosis not present

## 2016-03-05 DIAGNOSIS — M25511 Pain in right shoulder: Secondary | ICD-10-CM | POA: Insufficient documentation

## 2016-03-05 DIAGNOSIS — Z89029 Acquired absence of unspecified finger(s): Secondary | ICD-10-CM | POA: Diagnosis not present

## 2016-03-05 DIAGNOSIS — Z87442 Personal history of urinary calculi: Secondary | ICD-10-CM | POA: Diagnosis not present

## 2016-03-05 DIAGNOSIS — Z7952 Long term (current) use of systemic steroids: Secondary | ICD-10-CM | POA: Insufficient documentation

## 2016-03-05 LAB — CBC WITH DIFFERENTIAL/PLATELET
BASOS ABS: 0 10*3/uL (ref 0–0.1)
Basophils Relative: 1 %
EOS ABS: 0 10*3/uL (ref 0–0.7)
EOS PCT: 0 %
HCT: 38.8 % — ABNORMAL LOW (ref 40.0–52.0)
HEMOGLOBIN: 12.9 g/dL — AB (ref 13.0–18.0)
LYMPHS ABS: 0.9 10*3/uL — AB (ref 1.0–3.6)
Lymphocytes Relative: 14 %
MCH: 29.8 pg (ref 26.0–34.0)
MCHC: 33.4 g/dL (ref 32.0–36.0)
MCV: 89.3 fL (ref 80.0–100.0)
Monocytes Absolute: 0.3 10*3/uL (ref 0.2–1.0)
Monocytes Relative: 5 %
NEUTROS PCT: 80 %
Neutro Abs: 5.1 10*3/uL (ref 1.4–6.5)
PLATELETS: 220 10*3/uL (ref 150–440)
RBC: 4.35 MIL/uL — AB (ref 4.40–5.90)
RDW: 13.1 % (ref 11.5–14.5)
WBC: 6.4 10*3/uL (ref 3.8–10.6)

## 2016-03-05 LAB — COMPREHENSIVE METABOLIC PANEL
ALBUMIN: 3.3 g/dL — AB (ref 3.5–5.0)
ALK PHOS: 44 U/L (ref 38–126)
ALT: 198 U/L — AB (ref 17–63)
AST: 167 U/L — AB (ref 15–41)
Anion gap: 6 (ref 5–15)
BUN: 14 mg/dL (ref 6–20)
CALCIUM: 8.6 mg/dL — AB (ref 8.9–10.3)
CHLORIDE: 104 mmol/L (ref 101–111)
CO2: 29 mmol/L (ref 22–32)
CREATININE: 1.03 mg/dL (ref 0.61–1.24)
GFR calc Af Amer: >60 mL/min (ref >60)
GFR calc non Af Amer: >60 mL/min (ref >60)
GLUCOSE: 146 mg/dL — AB (ref 65–99)
Potassium: 4 mmol/L (ref 3.5–5.1)
SODIUM: 139 mmol/L (ref 135–145)
Total Bilirubin: 0.7 mg/dL (ref 0.3–1.2)
Total Protein: 6.9 g/dL (ref 6.5–8.1)

## 2016-03-05 NOTE — Progress Notes (Signed)
Patient came to cancer ctr for lab/md visit.  Patient had labs drawn. Patient no in building when his name was called to see provider. Multiple attempts were made to find patient. Patient left building - never evaluated by provider. Will r/s md apt.

## 2016-03-14 ENCOUNTER — Ambulatory Visit: Admission: RE | Admit: 2016-03-14 | Payer: BLUE CROSS/BLUE SHIELD | Source: Ambulatory Visit

## 2016-03-14 ENCOUNTER — Ambulatory Visit
Admission: RE | Admit: 2016-03-14 | Discharge: 2016-03-14 | Disposition: A | Discharge status: Home / Self Care | Payer: BLUE CROSS/BLUE SHIELD | Source: Ambulatory Visit | Attending: Internal Medicine | Admitting: Internal Medicine

## 2016-03-14 ENCOUNTER — Telehealth: Payer: Self-pay | Admitting: *Deleted

## 2016-03-14 DIAGNOSIS — I2510 Atherosclerotic heart disease of native coronary artery without angina pectoris: Secondary | ICD-10-CM | POA: Diagnosis not present

## 2016-03-14 DIAGNOSIS — C3411 Malignant neoplasm of upper lobe, right bronchus or lung: Secondary | ICD-10-CM | POA: Diagnosis not present

## 2016-03-14 DIAGNOSIS — J439 Emphysema, unspecified: Secondary | ICD-10-CM | POA: Insufficient documentation

## 2016-03-14 DIAGNOSIS — I700 Atherosclerosis of aorta: Secondary | ICD-10-CM | POA: Insufficient documentation

## 2016-03-14 MED ORDER — IOPAMIDOL (ISOVUE-300) INJECTION 61%
75.0000 mL | Freq: Once | INTRAVENOUS | Status: AC | PRN
  Administered 2016-03-14: 75 mL via INTRAVENOUS

## 2016-03-14 NOTE — Telephone Encounter (Signed)
#  disconnected. Unable to reach patient.

## 2016-03-15 ENCOUNTER — Telehealth: Payer: Self-pay | Admitting: *Deleted

## 2016-03-15 NOTE — Telephone Encounter (Signed)
2nd attempt to reach patient/pt's emergency contact. phone rings on emergency contact's # unable to leave vm on emergency contact list.  Pt's number listed has been disconnected.

## 2016-03-16 ENCOUNTER — Ambulatory Visit: Payer: BLUE CROSS/BLUE SHIELD | Admitting: Internal Medicine

## 2016-03-19 ENCOUNTER — Inpatient Hospital Stay (HOSPITAL_BASED_OUTPATIENT_CLINIC_OR_DEPARTMENT_OTHER): Payer: BLUE CROSS/BLUE SHIELD | Admitting: Internal Medicine

## 2016-03-19 ENCOUNTER — Inpatient Hospital Stay: Payer: BLUE CROSS/BLUE SHIELD

## 2016-03-19 VITALS — BP 114/67 | HR 72 | Temp 96.9°F | Ht 69.0 in | Wt 119.0 lb

## 2016-03-19 DIAGNOSIS — M25511 Pain in right shoulder: Secondary | ICD-10-CM

## 2016-03-19 DIAGNOSIS — Z9221 Personal history of antineoplastic chemotherapy: Secondary | ICD-10-CM

## 2016-03-19 DIAGNOSIS — Z8781 Personal history of (healed) traumatic fracture: Secondary | ICD-10-CM

## 2016-03-19 DIAGNOSIS — C3411 Malignant neoplasm of upper lobe, right bronchus or lung: Secondary | ICD-10-CM

## 2016-03-19 DIAGNOSIS — B1920 Unspecified viral hepatitis C without hepatic coma: Secondary | ICD-10-CM

## 2016-03-19 DIAGNOSIS — Z87442 Personal history of urinary calculi: Secondary | ICD-10-CM

## 2016-03-19 DIAGNOSIS — R948 Abnormal results of function studies of other organs and systems: Secondary | ICD-10-CM | POA: Diagnosis not present

## 2016-03-19 DIAGNOSIS — Z7952 Long term (current) use of systemic steroids: Secondary | ICD-10-CM

## 2016-03-19 DIAGNOSIS — G8929 Other chronic pain: Secondary | ICD-10-CM | POA: Diagnosis not present

## 2016-03-19 DIAGNOSIS — F17210 Nicotine dependence, cigarettes, uncomplicated: Secondary | ICD-10-CM

## 2016-03-19 DIAGNOSIS — Z89029 Acquired absence of unspecified finger(s): Secondary | ICD-10-CM

## 2016-03-19 DIAGNOSIS — M129 Arthropathy, unspecified: Secondary | ICD-10-CM

## 2016-03-19 LAB — HEPATIC FUNCTION PANEL
ALBUMIN: 3.4 g/dL — AB (ref 3.5–5.0)
ALT: 195 U/L — ABNORMAL HIGH (ref 17–63)
AST: 149 U/L — ABNORMAL HIGH (ref 15–41)
Alkaline Phosphatase: 47 U/L (ref 38–126)
BILIRUBIN TOTAL: 0.5 mg/dL (ref 0.3–1.2)
Bilirubin, Direct: 0.2 mg/dL (ref 0.1–0.5)
Indirect Bilirubin: 0.3 mg/dL (ref 0.3–0.9)
TOTAL PROTEIN: 7.3 g/dL (ref 6.5–8.1)

## 2016-03-19 NOTE — Progress Notes (Signed)
Patient here today for CT results. 

## 2016-03-19 NOTE — Progress Notes (Signed)
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   # Hepatitis C- follow up [facility]?   # Chronic pain- on oxycodone.jan 2017-Bone scan- neg.       Cancer of upper lobe of right lung ([place])   09/19/2015 Initial Diagnosis    Cancer of upper lobe of right lung ([place])        INTERVAL HISTORY:  Vague historian.  55 year old male patient with above history of  Adenocarcinoma/Recurrent currently on Tecentriq q 3W; on HOLD Because of elevated LFTs is here for follow-up/To review the results of the restaging CAT scan.   Patient continues treatment prednisone 20 mg twice a day. He states that he has been taking it on a regular basis. Denies any nausea vomiting or abdominal pain.  He continues to have chronic right shoulder pain for which is on pain medications for pain management clinic. There have been concerns of compliance with his steroids.   REVIEW OF SYSTEMS:  No headaches or vision changes. A complete 10 point review of system is done which is negative for mentioned above in history of present illness.  PAST MEDICAL HISTORY :  Past Medical History:  Diagnosis Date  . Arthritis   . Chicken pox   . Kidney stones   . Lung cancer ([place]) 2015    PAST SURGICAL HISTORY :   Past Surgical History:  Procedure Laterality Date  . amputation of index finger  2010  . HAND  SURGERY Right 2010   ORIF of right index finder, 2nd carpometacarpal joint dislocation right hand, proximal right humerus  . ORIF FEMUR FRACTURE Right 2010  . SHOULDER SURGERY Right 2010   pins and rods in shoulder    FAMILY HISTORY :  No family history on file.  SOCIAL HISTORY:   Social History  Substance Use Topics  . Smoking status: Current Every Day Smoker    Packs/day: 2.00    Years: 20.00    Types: Cigarettes  . Smokeless tobacco: Never Used  . Alcohol use 0.0 oz/week    ALLERGIES:  has No Known Allergies.  MEDICATIONS:  Current Outpatient Prescriptions  Medication Sig Dispense Refill  . Oxycodone HCl 10 MG TABS Limit one half to one tablet by mouth per day or twice per day if tolerated 60 tablet 0  . predniSONE (DELTASONE) 20 MG tablet Take 2 pills every 12 hours with food. 60 tablet 0   Current Facility-Administered Medications  Medication Dose Route Frequency Provider Last Rate Last Dose  . bupivacaine (PF) (MARCAINE) 0.25 % injection 30 mL  30 mL Other Once Mohammed Kindle, MD      . sodium chloride flush (NS) 0.9 % injection 20 mL  20 mL Other Once Mohammed Kindle, MD      . triamcinolone acetonide (KENALOG-40) injection 40 mg  40 mg Other Once Mohammed Kindle, MD  Facility-Administered Medications Ordered in Other Visits  Medication Dose Route Frequency Provider Last Rate Last Dose  . sodium chloride flush (NS) 0.9 % injection 10 mL  10 mL Intracatheter PRN Cammie Sickle, MD   10 mL at 05/26/15 1300  . sodium chloride flush (NS) 0.9 % injection 10 mL  10 mL Intravenous PRN Cammie Sickle, MD   10 mL at 07/07/15 1425    PHYSICAL EXAMINATION: ECOG PERFORMANCE STATUS: 0 - Asymptomatic  BP 114/67 (BP Location: Right Arm, Patient Position: Sitting)   Pulse 72   Temp (!) 96.9 F (36.1 C) (Tympanic)   Ht '5\' 9"'$  (1.753 m)   Wt 119 lb (54 kg)   BMI 17.57 kg/m   Filed Weights   03/19/16 1537  Weight: 119 lb (54 kg)    GENERAL:  Thin built/  moderately nourished; Alert, no distress and comfortable. He is alone.  EYES: no pallor or icterus OROPHARYNX: no thrush or ulceration; poor dentition  NECK: supple, no masses felt LYMPH:  no palpable lymphadenopathy in the cervical, axillary or inguinal regions LUNGS:  Decreased breath sounds bilaterally to auscultation. No wheeze or crackles HEART/CVS: regular rate & rhythm and no murmurs; No lower extremity edema ABDOMEN:abdomen soft, non-tender and normal bowel sounds Musculoskeletal:no cyanosis of digits and no clubbing  PSYCH: alert & oriented x 3 with fluent speech NEURO: no focal motor/sensory deficits SKIN:  no rashes or significant lesions  LABORATORY DATA:  I have reviewed the data as listed    Component Value Date/Time   NA 139 03/05/2016 1440   NA 136 07/27/2014 0856   K 4.0 03/05/2016 1440   K 3.9 07/27/2014 0856   CL 104 03/05/2016 1440   CL 104 07/27/2014 0856   CO2 29 03/05/2016 1440   CO2 27 07/27/2014 0856   GLUCOSE 146 (H) 03/05/2016 1440   GLUCOSE 151 (H) 07/27/2014 0856   BUN 14 03/05/2016 1440   BUN 10 07/27/2014 0856   CREATININE 1.03 03/05/2016 1440   CREATININE 0.86 07/27/2014 0856   CREATININE 0.77 07/06/2014   CALCIUM 8.6 (L) 03/05/2016 1440   CALCIUM 8.6 (L) 07/27/2014 0856   PROT 7.3 03/19/2016 1555   PROT 8.8 (H) 06/28/2014 1512   ALBUMIN 3.4 (L) 03/19/2016 1555   ALBUMIN 4.0 06/28/2014 1512   AST 149 (H) 03/19/2016 1555   AST 37 06/28/2014 1512   ALT 195 (H) 03/19/2016 1555   ALT 43 06/28/2014 1512   ALKPHOS 47 03/19/2016 1555   ALKPHOS 53 06/28/2014 1512   BILITOT 0.5 03/19/2016 1555   BILITOT 0.3 06/28/2014 1512   GFRNONAA >60 03/05/2016 1440   GFRNONAA >60 07/27/2014 0856   GFRAA >60 03/05/2016 1440   GFRAA >60 07/27/2014 0856    No results found for: SPEP, UPEP  Lab Results  Component Value Date   WBC 6.4 03/05/2016   NEUTROABS 5.1 03/05/2016   HGB 12.9 (L) 03/05/2016   HCT 38.8 (L) 03/05/2016   MCV 89.3 03/05/2016   PLT  220 03/05/2016      Chemistry      Component Value Date/Time   NA 139 03/05/2016 1440   NA 136 07/27/2014 0856   K 4.0 03/05/2016 1440   K 3.9 07/27/2014 0856   CL 104 03/05/2016 1440   CL 104 07/27/2014 0856   CO2 29 03/05/2016 1440   CO2 27 07/27/2014 0856   BUN 14 03/05/2016 1440   BUN 10 07/27/2014 0856   CREATININE 1.03 03/05/2016 1440   CREATININE  0.86 07/27/2014 0856   CREATININE 0.77 07/06/2014      Component Value Date/Time   CALCIUM 8.6 (L) 03/05/2016 1440   CALCIUM 8.6 (L) 07/27/2014 0856   ALKPHOS 47 03/19/2016 1555   ALKPHOS 53 06/28/2014 1512   AST 149 (H) 03/19/2016 1555   AST 37 06/28/2014 1512   ALT 195 (H) 03/19/2016 1555   ALT 43 06/28/2014 1512   BILITOT 0.5 03/19/2016 1555   BILITOT 0.3 06/28/2014 1512      ASSESSMENT & PLAN:   Cancer of upper lobe of right lung (HCC) Recurrent adenocarcinoma lung- currently on Tecentriq- tolerating well clinically no evidence of progression; Mar 17 2016- CT scan- Stable ~1.8cm nodule. Last Tecentriq on July 12th.  # HOLD tecentriq sec to elevated LFTs.  # LFT elevation- AST/ALT ~ 180-190s stable; Normal bilirubin. Likely  Related to Tecentriq.  Continue Prednisone 20 once a day; taper based on LFTs response. Pt also awaiting Appt at [facility] hepatology clinic [history of hepatitis].   #  weekly every 2 weeks; follow up in 2 months/labs.  # I reviewed the blood work- with the patient in detail; also reviewed the imaging independently [as summarized above]; and with the patient in detail.    Cammie Sickle, MD 03/19/2016 5:39 PM

## 2016-03-19 NOTE — Assessment & Plan Note (Addendum)
Recurrent adenocarcinoma lung- currently on Tecentriq- tolerating well clinically no evidence of progression; Mar 17 2016- CT scan- Stable ~1.8cm nodule. Last Tecentriq on July 12th.  # HOLD tecentriq sec to elevated LFTs.  # LFT elevation- AST/ALT ~ 180-190s stable; Normal bilirubin. Likely  Related to Tecentriq.  Continue Prednisone 20 once a day; taper based on LFTs response. Pt also awaiting Appt at [facility] hepatology clinic [history of hepatitis].   #  weekly every 2 weeks; follow up in 2 months/labs.  # I reviewed the blood work- with the patient in detail; also reviewed the imaging independently [as summarized above]; and with the patient in detail.

## 2016-03-19 NOTE — Progress Notes (Signed)
Patient has a new temporary phone number. He declined to give this new number to the nurse. He states that he "would call the nurse tomorrow to get hi lab results from today."

## 2016-04-03 ENCOUNTER — Other Ambulatory Visit: Payer: Self-pay

## 2016-04-03 ENCOUNTER — Inpatient Hospital Stay: Payer: BLUE CROSS/BLUE SHIELD | Attending: Internal Medicine

## 2016-04-03 DIAGNOSIS — F17210 Nicotine dependence, cigarettes, uncomplicated: Secondary | ICD-10-CM | POA: Insufficient documentation

## 2016-04-03 DIAGNOSIS — R7989 Other specified abnormal findings of blood chemistry: Secondary | ICD-10-CM | POA: Insufficient documentation

## 2016-04-03 DIAGNOSIS — G8929 Other chronic pain: Secondary | ICD-10-CM | POA: Insufficient documentation

## 2016-04-03 DIAGNOSIS — Z9221 Personal history of antineoplastic chemotherapy: Secondary | ICD-10-CM | POA: Diagnosis not present

## 2016-04-03 DIAGNOSIS — G893 Neoplasm related pain (acute) (chronic): Secondary | ICD-10-CM | POA: Diagnosis not present

## 2016-04-03 DIAGNOSIS — Z87442 Personal history of urinary calculi: Secondary | ICD-10-CM | POA: Diagnosis not present

## 2016-04-03 DIAGNOSIS — C3411 Malignant neoplasm of upper lobe, right bronchus or lung: Secondary | ICD-10-CM | POA: Insufficient documentation

## 2016-04-03 DIAGNOSIS — Z923 Personal history of irradiation: Secondary | ICD-10-CM | POA: Insufficient documentation

## 2016-04-03 DIAGNOSIS — M25511 Pain in right shoulder: Secondary | ICD-10-CM | POA: Insufficient documentation

## 2016-04-03 DIAGNOSIS — Z8619 Personal history of other infectious and parasitic diseases: Secondary | ICD-10-CM | POA: Diagnosis not present

## 2016-04-03 DIAGNOSIS — M129 Arthropathy, unspecified: Secondary | ICD-10-CM | POA: Insufficient documentation

## 2016-04-03 DIAGNOSIS — Z7952 Long term (current) use of systemic steroids: Secondary | ICD-10-CM | POA: Diagnosis not present

## 2016-04-03 LAB — HEPATIC FUNCTION PANEL
ALBUMIN: 3.5 g/dL (ref 3.5–5.0)
ALT: 175 U/L — ABNORMAL HIGH (ref 17–63)
AST: 246 U/L — ABNORMAL HIGH (ref 15–41)
Alkaline Phosphatase: 54 U/L (ref 38–126)
Bilirubin, Direct: 0.2 mg/dL (ref 0.1–0.5)
Indirect Bilirubin: 0.5 mg/dL (ref 0.3–0.9)
TOTAL PROTEIN: 7.5 g/dL (ref 6.5–8.1)
Total Bilirubin: 0.7 mg/dL (ref 0.3–1.2)

## 2016-04-04 ENCOUNTER — Other Ambulatory Visit: Payer: Self-pay | Admitting: Pain Medicine

## 2016-04-06 ENCOUNTER — Telehealth: Payer: Self-pay | Admitting: *Deleted

## 2016-04-06 NOTE — Telephone Encounter (Signed)
-----   Message from Cammie Sickle, MD sent at 04/06/2016  8:01 AM EST ----- Please inform patient that his liver test is still high; please confirm if patient is taking prednisone. recommend continued prednisone 20 mg twice a day. Make follow-up appointment with me when he comes back for the labs on January 15th.

## 2016-04-06 NOTE — Telephone Encounter (Signed)
Left msg on phone line [phone] vm states this vm was for "patricia Champney" (pt's wife).  Left vm asking Mardene Celeste to have Zebedee Iba to call Dr. Aletha Halim office at the [facility].

## 2016-04-09 ENCOUNTER — Telehealth: Payer: Self-pay | Admitting: *Deleted

## 2016-04-09 NOTE — Telephone Encounter (Signed)
Left vm asking patient to contact our office asap

## 2016-04-09 NOTE — Telephone Encounter (Signed)
-----   Message from Cammie Sickle, MD sent at 04/06/2016  8:01 AM EST ----- Please inform patient that his liver test is still high; please confirm if patient is taking prednisone. recommend continued prednisone 20 mg twice a day. Make follow-up appointment with me when he comes back for the labs on January 15th.

## 2016-04-16 ENCOUNTER — Inpatient Hospital Stay: Payer: BLUE CROSS/BLUE SHIELD

## 2016-04-16 ENCOUNTER — Inpatient Hospital Stay (HOSPITAL_BASED_OUTPATIENT_CLINIC_OR_DEPARTMENT_OTHER): Payer: BLUE CROSS/BLUE SHIELD | Admitting: Internal Medicine

## 2016-04-16 VITALS — BP 125/72 | HR 75 | Temp 97.4°F | Wt 121.5 lb

## 2016-04-16 DIAGNOSIS — G8929 Other chronic pain: Secondary | ICD-10-CM

## 2016-04-16 DIAGNOSIS — Z7952 Long term (current) use of systemic steroids: Secondary | ICD-10-CM

## 2016-04-16 DIAGNOSIS — Z8619 Personal history of other infectious and parasitic diseases: Secondary | ICD-10-CM

## 2016-04-16 DIAGNOSIS — C3411 Malignant neoplasm of upper lobe, right bronchus or lung: Secondary | ICD-10-CM | POA: Diagnosis not present

## 2016-04-16 DIAGNOSIS — R945 Abnormal results of liver function studies: Principal | ICD-10-CM

## 2016-04-16 DIAGNOSIS — M25511 Pain in right shoulder: Secondary | ICD-10-CM

## 2016-04-16 DIAGNOSIS — R7989 Other specified abnormal findings of blood chemistry: Secondary | ICD-10-CM

## 2016-04-16 DIAGNOSIS — Z923 Personal history of irradiation: Secondary | ICD-10-CM

## 2016-04-16 DIAGNOSIS — G893 Neoplasm related pain (acute) (chronic): Secondary | ICD-10-CM | POA: Diagnosis not present

## 2016-04-16 DIAGNOSIS — F17210 Nicotine dependence, cigarettes, uncomplicated: Secondary | ICD-10-CM

## 2016-04-16 DIAGNOSIS — Z87442 Personal history of urinary calculi: Secondary | ICD-10-CM

## 2016-04-16 DIAGNOSIS — Z9221 Personal history of antineoplastic chemotherapy: Secondary | ICD-10-CM

## 2016-04-16 DIAGNOSIS — M129 Arthropathy, unspecified: Secondary | ICD-10-CM

## 2016-04-16 LAB — HEPATIC FUNCTION PANEL
ALK PHOS: 57 U/L (ref 38–126)
ALT: 389 U/L — AB (ref 17–63)
AST: 496 U/L — ABNORMAL HIGH (ref 15–41)
Albumin: 3.6 g/dL (ref 3.5–5.0)
BILIRUBIN DIRECT: 0.3 mg/dL (ref 0.1–0.5)
BILIRUBIN INDIRECT: 0.5 mg/dL (ref 0.3–0.9)
BILIRUBIN TOTAL: 0.8 mg/dL (ref 0.3–1.2)
Total Protein: 7.5 g/dL (ref 6.5–8.1)

## 2016-04-16 NOTE — Progress Notes (Signed)
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   # Hepatitis C- follow up [facility]?   # Chronic pain- on oxycodone.jan 2017-Bone scan- neg.       Cancer of upper lobe of right lung ([place])   09/19/2015 Initial Diagnosis    Cancer of upper lobe of right lung ([place])        INTERVAL HISTORY:  Vague historian.   56 year old male patient with above history of  Adenocarcinoma/Recurrent currently on Tecentriq q 3W; on HOLD Because of elevated LFTs is here for follow-up.   Patient states he is currently taking prednisone 20 mg once a day. He states that he has been taking it on a regular basis. Denies any nausea vomiting or abdominal pain.  He continues to have chronic right shoulder pain for which is on pain medications for pain management clinic. There have been concerns of compliance with his steroids.    REVIEW OF SYSTEMS:  No headaches or vision changes. A complete 10 point review of system is done which is negative for mentioned above in history of present illness.  PAST MEDICAL HISTORY :  Past Medical History:  Diagnosis Date  . Arthritis   . Chicken pox   . Kidney stones   . Lung cancer ([place]) 2015    PAST SURGICAL HISTORY :   Past Surgical History:  Procedure Laterality Date  . amputation of index finger  2010  . HAND SURGERY Right 2010   ORIF of right index  finder, 2nd carpometacarpal joint dislocation right hand, proximal right humerus  . ORIF FEMUR FRACTURE Right 2010  . SHOULDER SURGERY Right 2010   pins and rods in shoulder    FAMILY HISTORY :  No family history on file.  SOCIAL HISTORY:   Social History  Substance Use Topics  . Smoking status: Current Every Day Smoker    Packs/day: 2.00    Years: 20.00    Types: Cigarettes  . Smokeless tobacco: Never Used  . Alcohol use 0.0 oz/week    ALLERGIES:  has No Known Allergies.  MEDICATIONS:  Current Outpatient Prescriptions  Medication Sig Dispense Refill  . Oxycodone HCl 10 MG TABS Limit one half to one tablet by mouth per day or twice per day if tolerated 60 tablet 0  . predniSONE (DELTASONE) 20 MG tablet Take 2 pills every 12 hours with food. 60 tablet 0   Current Facility-Administered Medications  Medication Dose Route Frequency Provider Last Rate Last Dose  . bupivacaine (PF) (MARCAINE) 0.25 % injection 30 mL  30 mL Other Once Mohammed Kindle, MD      . sodium chloride flush (NS) 0.9 % injection 20 mL  20 mL Other Once Mohammed Kindle, MD      . triamcinolone acetonide (KENALOG-40) injection 40 mg  40 mg Other Once Mohammed Kindle, MD  Facility-Administered Medications Ordered in Other Visits  Medication Dose Route Frequency Provider Last Rate Last Dose  . sodium chloride flush (NS) 0.9 % injection 10 mL  10 mL Intracatheter PRN Cammie Sickle, MD   10 mL at 05/26/15 1300  . sodium chloride flush (NS) 0.9 % injection 10 mL  10 mL Intravenous PRN Cammie Sickle, MD   10 mL at 07/07/15 1425    PHYSICAL EXAMINATION: ECOG PERFORMANCE STATUS: 0 - Asymptomatic  BP 125/72 (BP Location: Left Arm, Patient Position: Sitting)   Pulse 75   Temp 97.4 F (36.3 C) (Tympanic)   Wt 121 lb 8 oz (55.1 kg)   BMI 17.94 kg/m   Filed Weights   04/16/16 1511  Weight: 121 lb 8 oz (55.1 kg)    GENERAL:  Thin built/ moderately nourished; Alert, no distress and comfortable. He  is alone.  EYES: no pallor or icterus OROPHARYNX: no thrush or ulceration; poor dentition  NECK: supple, no masses felt LYMPH:  no palpable lymphadenopathy in the cervical, axillary or inguinal regions LUNGS:  Decreased breath sounds bilaterally to auscultation. No wheeze or crackles HEART/CVS: regular rate & rhythm and no murmurs; No lower extremity edema ABDOMEN:abdomen soft, non-tender and normal bowel sounds Musculoskeletal:no cyanosis of digits and no clubbing  PSYCH: alert & oriented x 3 with fluent speech NEURO: no focal motor/sensory deficits SKIN:  no rashes or significant lesions  LABORATORY DATA:  I have reviewed the data as listed    Component Value Date/Time   NA 139 03/05/2016 1440   NA 136 07/27/2014 0856   K 4.0 03/05/2016 1440   K 3.9 07/27/2014 0856   CL 104 03/05/2016 1440   CL 104 07/27/2014 0856   CO2 29 03/05/2016 1440   CO2 27 07/27/2014 0856   GLUCOSE 146 (H) 03/05/2016 1440   GLUCOSE 151 (H) 07/27/2014 0856   BUN 14 03/05/2016 1440   BUN 10 07/27/2014 0856   CREATININE 1.03 03/05/2016 1440   CREATININE 0.86 07/27/2014 0856   CREATININE 0.77 07/06/2014   CALCIUM 8.6 (L) 03/05/2016 1440   CALCIUM 8.6 (L) 07/27/2014 0856   PROT 7.5 04/16/2016 1445   PROT 8.8 (H) 06/28/2014 1512   ALBUMIN 3.6 04/16/2016 1445   ALBUMIN 4.0 06/28/2014 1512   AST 496 (H) 04/16/2016 1445   AST 37 06/28/2014 1512   ALT 389 (H) 04/16/2016 1445   ALT 43 06/28/2014 1512   ALKPHOS 57 04/16/2016 1445   ALKPHOS 53 06/28/2014 1512   BILITOT 0.8 04/16/2016 1445   BILITOT 0.3 06/28/2014 1512   GFRNONAA >60 03/05/2016 1440   GFRNONAA >60 07/27/2014 0856   GFRAA >60 03/05/2016 1440   GFRAA >60 07/27/2014 0856    No results found for: SPEP, UPEP  Lab Results  Component Value Date   WBC 6.4 03/05/2016   NEUTROABS 5.1 03/05/2016   HGB 12.9 (L) 03/05/2016   HCT 38.8 (L) 03/05/2016   MCV 89.3 03/05/2016   PLT 220 03/05/2016      Chemistry      Component Value  Date/Time   NA 139 03/05/2016 1440   NA 136 07/27/2014 0856   K 4.0 03/05/2016 1440   K 3.9 07/27/2014 0856   CL 104 03/05/2016 1440   CL 104 07/27/2014 0856   CO2 29 03/05/2016 1440   CO2 27 07/27/2014 0856   BUN 14 03/05/2016 1440   BUN 10 07/27/2014 0856   CREATININE 1.03 03/05/2016 1440   CREATININE 0.86 07/27/2014 0856  CREATININE 0.77 07/06/2014      Component Value Date/Time   CALCIUM 8.6 (L) 03/05/2016 1440   CALCIUM 8.6 (L) 07/27/2014 0856   ALKPHOS 57 04/16/2016 1445   ALKPHOS 53 06/28/2014 1512   AST 496 (H) 04/16/2016 1445   AST 37 06/28/2014 1512   ALT 389 (H) 04/16/2016 1445   ALT 43 06/28/2014 1512   BILITOT 0.8 04/16/2016 1445   BILITOT 0.3 06/28/2014 1512      ASSESSMENT & PLAN:   Cancer of upper lobe of right lung (HCC) Recurrent adenocarcinoma lung- currently on Tecentriq- tolerating well clinically no evidence of progression; Mar 17 2016- CT scan- Stable ~1.8cm nodule. Last Tecentriq on July 12th.  # HOLD tecentriq sec to elevated LFTs.  # LFT elevation- AST/ALT ~ Continuing to increase: Today AST 496 and ALT 389. Normal bilirubin.  Referral to Dr. Vicente Males in GI for further evaluation and management. He needs liver biopsy. Order placed. Increase prednisone to 20 mg TID. Have patient follow-up weekly to monitor labs. Need full hepatitis work-up. Order placed.   # Follow-up weekly for labs.   # Follow-up with Dr. Vicente Males with GI in [place] ASAP. Patient refused to go to [facility].   # Follow-up with [place] in 3 weeks for labs/MD.     Faythe Casa, NP   Jacquelin Hawking, NP 04/16/2016 4:54 PM

## 2016-04-16 NOTE — Progress Notes (Signed)
Patient here today for follow up.   

## 2016-04-16 NOTE — Assessment & Plan Note (Addendum)
Recurrent adenocarcinoma lung- currently on Tecentriq- tolerating well clinically no evidence of progression; Mar 17 2016- CT scan- Stable ~1.8cm nodule. Last Tecentriq on July 12th.  # HOLD tecentriq sec to elevated LFTs.  # LFT elevation- AST/ALT ~ Continuing to increase: Today AST 496 and ALT 389. Normal bilirubin.  Referral to Dr. Vicente Males in GI for further evaluation and management. He needs liver biopsy. Order placed. Increase prednisone to 20 mg TID. Have patient follow-up weekly to monitor labs. Need full hepatitis work-up. Order placed.   # Follow-up weekly for labs.   # Follow-up with Dr. Vicente Males with GI in [place] ASAP. Patient refused to go to [facility].   # Follow-up with [place] in 3 weeks for labs/MD.

## 2016-04-17 ENCOUNTER — Other Ambulatory Visit: Payer: Self-pay | Admitting: Gastroenterology

## 2016-04-17 ENCOUNTER — Telehealth: Payer: Self-pay | Admitting: Internal Medicine

## 2016-04-17 DIAGNOSIS — R7989 Other specified abnormal findings of blood chemistry: Secondary | ICD-10-CM

## 2016-04-17 DIAGNOSIS — R945 Abnormal results of liver function studies: Principal | ICD-10-CM

## 2016-04-17 NOTE — Telephone Encounter (Signed)
Spoke to Ms.London; NP-GI; [place]; appt next week; hold [place] bx for now. Awaiting recommendations  from GI prior to Bx.  Heather- please cancel the [place] bx for now. Thx

## 2016-04-17 NOTE — Telephone Encounter (Signed)
[place] bx order cnl in [facility]. Scheduling team notified to cnl orders.

## 2016-04-18 ENCOUNTER — Ambulatory Visit: Payer: BLUE CROSS/BLUE SHIELD | Admitting: Internal Medicine

## 2016-04-24 ENCOUNTER — Telehealth: Payer: Self-pay | Admitting: *Deleted

## 2016-04-25 ENCOUNTER — Ambulatory Visit
Admission: RE | Admit: 2016-04-25 | Discharge: 2016-04-25 | Disposition: A | Discharge status: Home / Self Care | Payer: BLUE CROSS/BLUE SHIELD | Source: Ambulatory Visit | Attending: Gastroenterology | Admitting: Gastroenterology

## 2016-04-25 DIAGNOSIS — R7989 Other specified abnormal findings of blood chemistry: Secondary | ICD-10-CM | POA: Diagnosis not present

## 2016-04-25 DIAGNOSIS — R945 Abnormal results of liver function studies: Secondary | ICD-10-CM

## 2016-04-27 ENCOUNTER — Inpatient Hospital Stay: Payer: BLUE CROSS/BLUE SHIELD

## 2016-04-30 ENCOUNTER — Inpatient Hospital Stay: Payer: BLUE CROSS/BLUE SHIELD

## 2016-05-04 ENCOUNTER — Inpatient Hospital Stay: Payer: BLUE CROSS/BLUE SHIELD | Attending: Internal Medicine

## 2016-05-08 NOTE — Telephone Encounter (Signed)
Open in error

## 2016-05-10 IMAGING — CR DG LUMBAR SPINE 2-3V
1 series · 3 of 3 positions shown · non-contrast
Comparison: 08/17/2013

CLINICAL DATA: Pain radiating to the low back and left leg for 2
weeks.

EXAM:
LUMBAR SPINE - 2-3 VIEW

[Series 1: dg lumbar spine 2-3 views · 0.14mm/px · 3 of 3 slices shown]
[im 1/3]
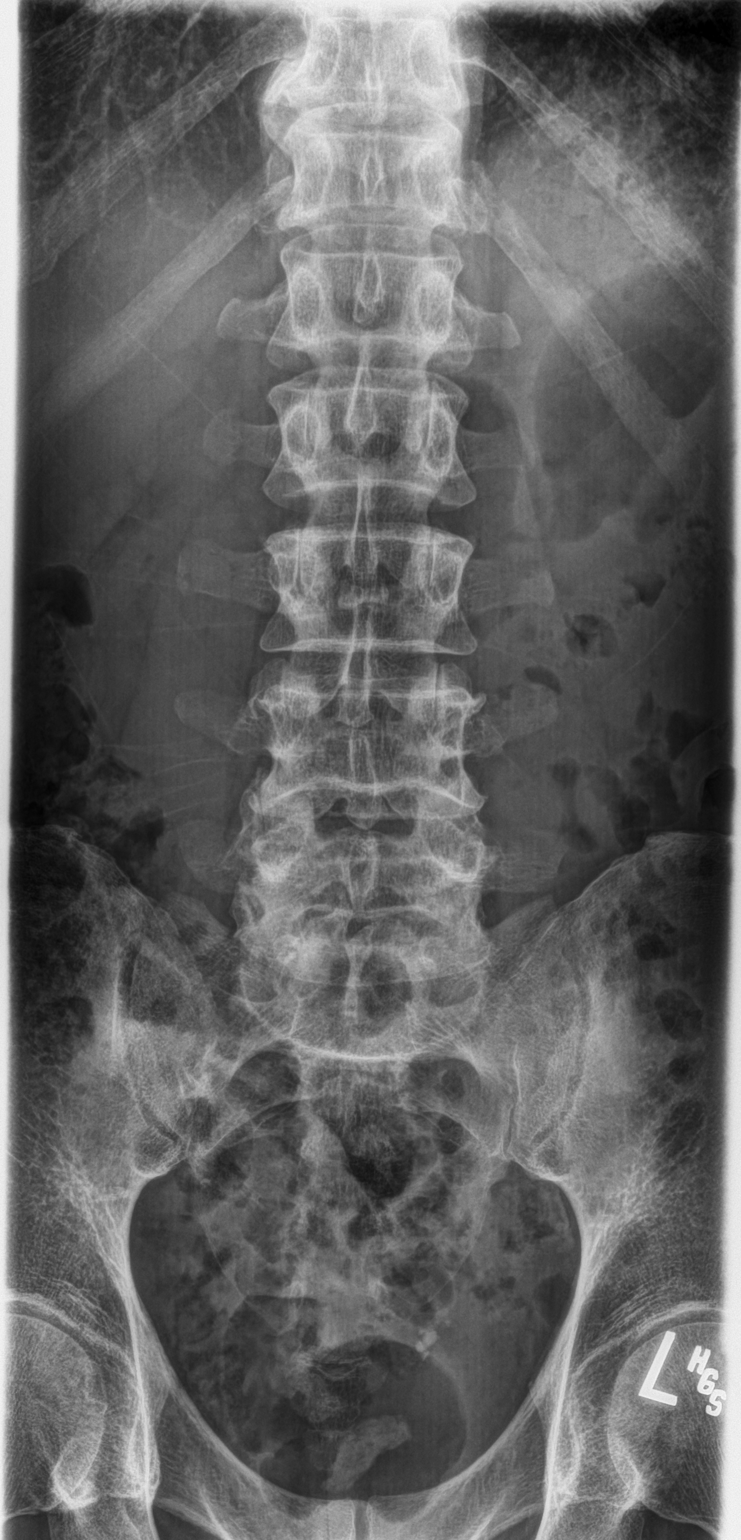
[im 2/3]
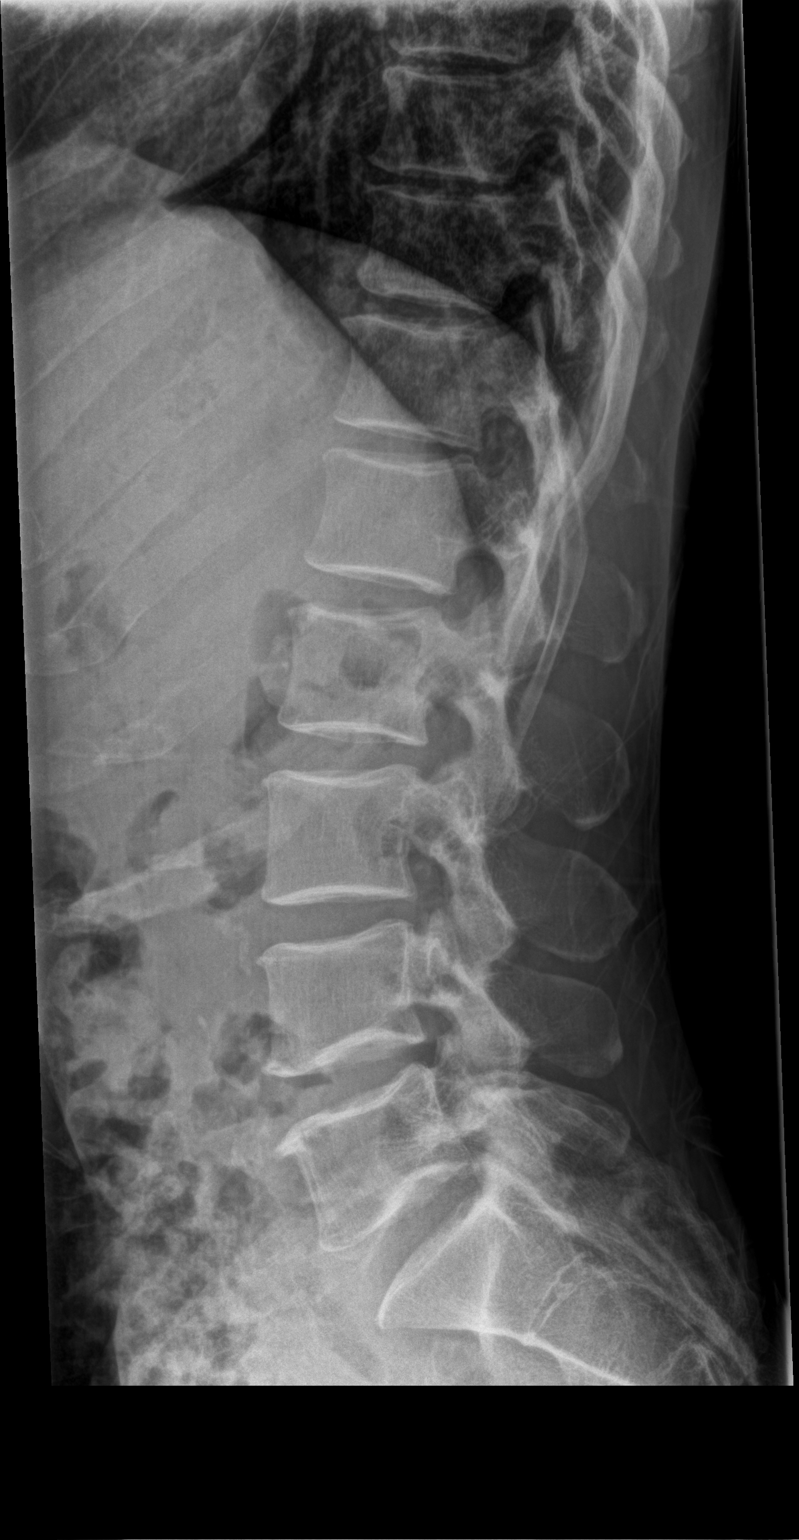
[im 3/3]
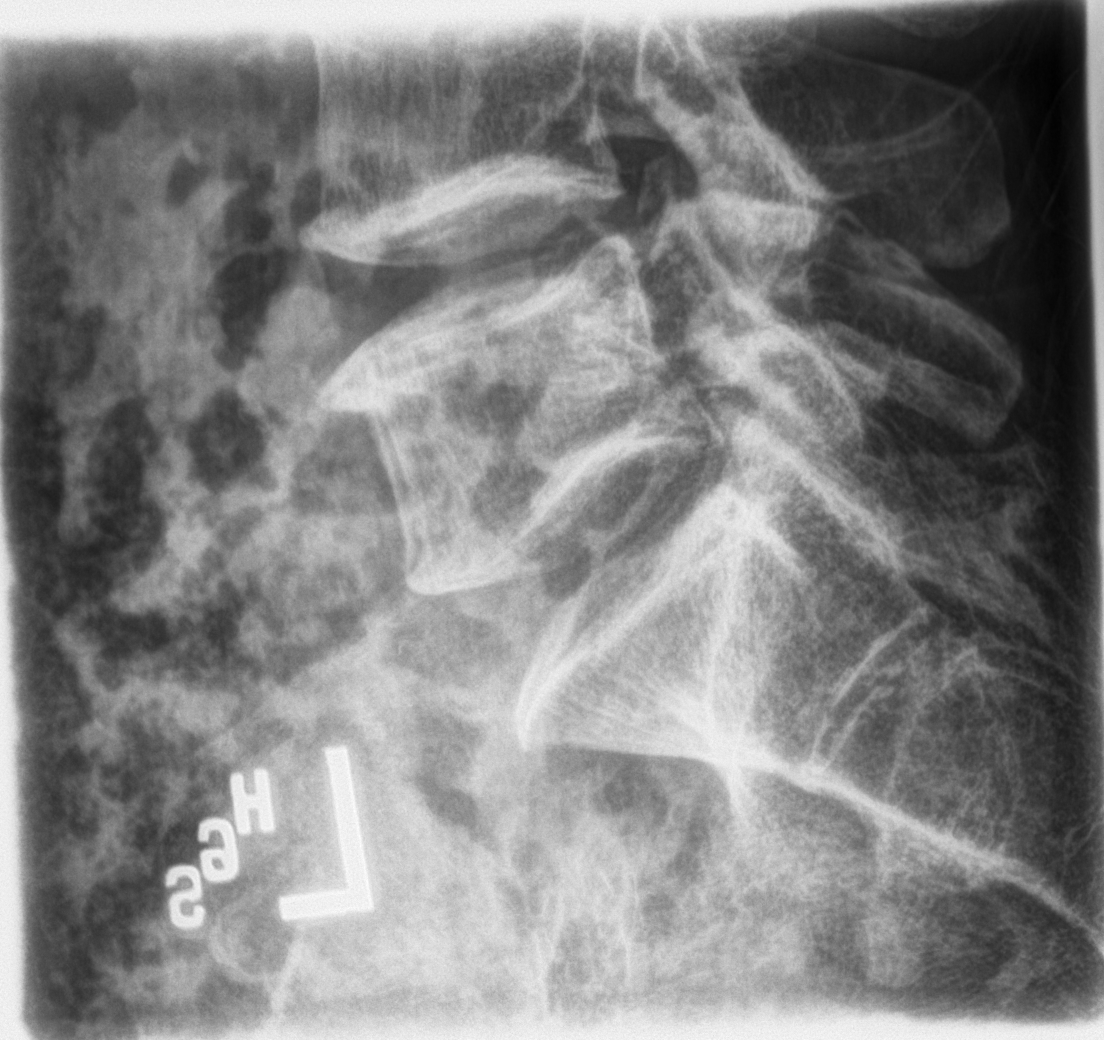

[3 of 3 positions shown; findings below may reference images not displayed]

FINDINGS: Normal alignment of the lumbar spine. The vertebral body heights and
disc spaces are maintained. Mild degenerative endplate changes at L4
and L5.
IMPRESSION: Mild degenerative changes.  No acute abnormality.

## 2016-05-11 ENCOUNTER — Inpatient Hospital Stay: Payer: BLUE CROSS/BLUE SHIELD | Admitting: Internal Medicine

## 2016-05-11 ENCOUNTER — Inpatient Hospital Stay: Payer: BLUE CROSS/BLUE SHIELD

## 2016-05-14 ENCOUNTER — Inpatient Hospital Stay: Payer: BLUE CROSS/BLUE SHIELD

## 2016-05-21 ENCOUNTER — Ambulatory Visit: Payer: BLUE CROSS/BLUE SHIELD | Admitting: Internal Medicine

## 2016-05-21 ENCOUNTER — Other Ambulatory Visit: Payer: BLUE CROSS/BLUE SHIELD

## 2016-05-28 ENCOUNTER — Other Ambulatory Visit: Payer: BLUE CROSS/BLUE SHIELD

## 2016-05-28 ENCOUNTER — Ambulatory Visit: Payer: BLUE CROSS/BLUE SHIELD | Admitting: Internal Medicine

## 2016-06-01 ENCOUNTER — Encounter: Payer: Self-pay | Admitting: *Deleted

## 2016-06-01 ENCOUNTER — Inpatient Hospital Stay: Payer: BLUE CROSS/BLUE SHIELD

## 2016-06-01 ENCOUNTER — Inpatient Hospital Stay: Payer: BLUE CROSS/BLUE SHIELD | Admitting: Internal Medicine

## 2016-06-04 NOTE — Progress Notes (Signed)
Patient dismissed from practice due to multiple no shows. Certified letter mailed to patient: usps tracking number [id number]

## 2016-06-05 ENCOUNTER — Other Ambulatory Visit: Payer: Self-pay | Admitting: Pain Medicine

## 2016-06-05 DIAGNOSIS — M47812 Spondylosis without myelopathy or radiculopathy, cervical region: Secondary | ICD-10-CM

## 2016-06-05 DIAGNOSIS — M4722 Other spondylosis with radiculopathy, cervical region: Secondary | ICD-10-CM

## 2016-06-05 DIAGNOSIS — M47892 Other spondylosis, cervical region: Secondary | ICD-10-CM

## 2016-06-05 DIAGNOSIS — M542 Cervicalgia: Secondary | ICD-10-CM

## 2016-06-05 IMAGING — CT CT CHEST W/ CM
2 of 3 series · 16 of 46 positions shown, 18 images · IV contrast (omnipaque)
Comparison: CTs ranging from 05/18/2014 through 03/01/2015. PET-CT
06/02/2014.

CLINICAL DATA: Diagnosis with right lung cancer 1 year ago post
chemotherapy and radiation therapy. Recent upper respiratory
infection.

EXAM:
CT CHEST WITH CONTRAST
TECHNIQUE: Multidetector CT imaging of the chest was performed during
intravenous contrast administration.
CONTRAST:  75mL OMNIPAQUE IOHEXOL 300 MG/ML  SOLN

[Series 2: routine chest with · axial · 0.59mm/px · z∈[-560,-260]mm · 13 of 70 slices shown, 15 images]
[im 5/70  soft-tissue]
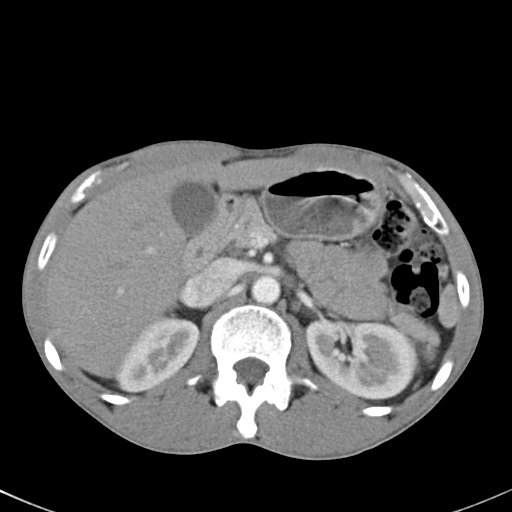
[im 5/70  bone]
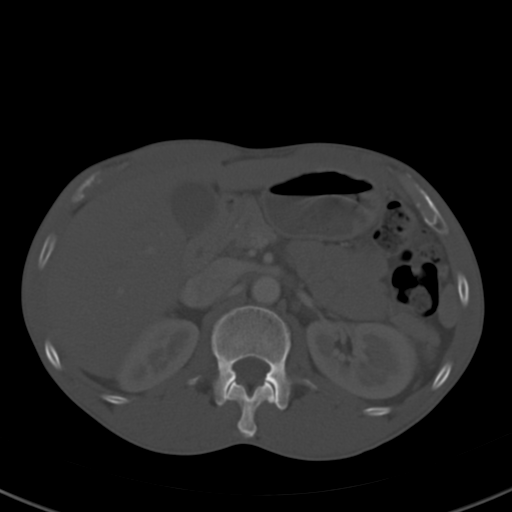
[im 9/70  soft-tissue]
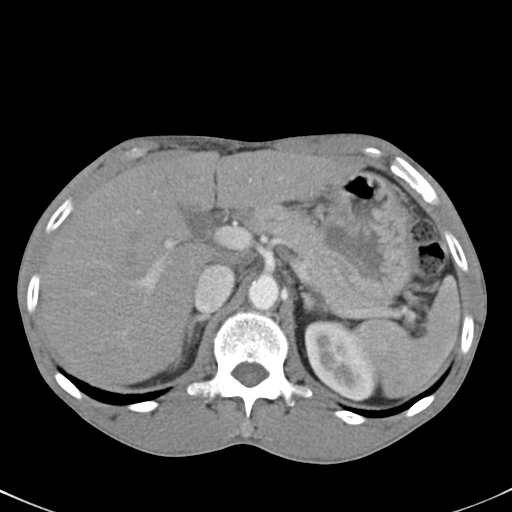
[im 14/70  soft-tissue]
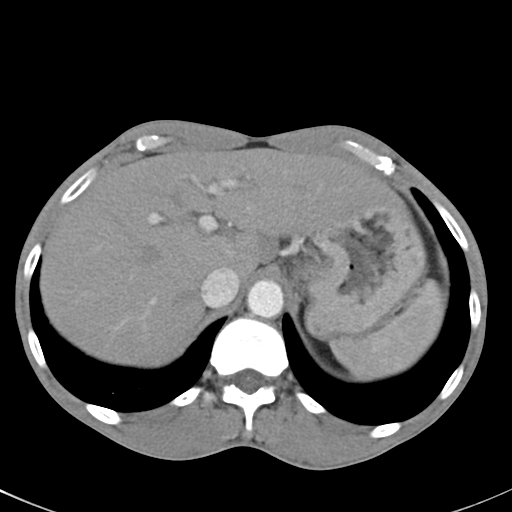
[im 21/70  soft-tissue]
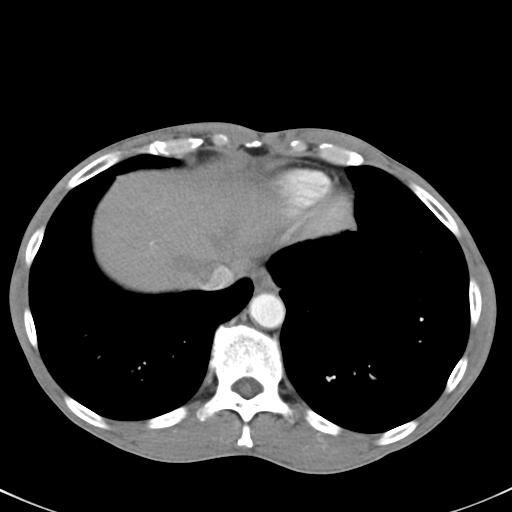
[im 25/70  soft-tissue]
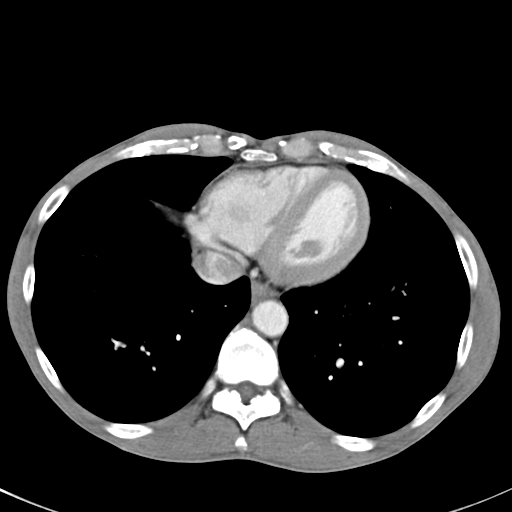
[im 29/70  soft-tissue]
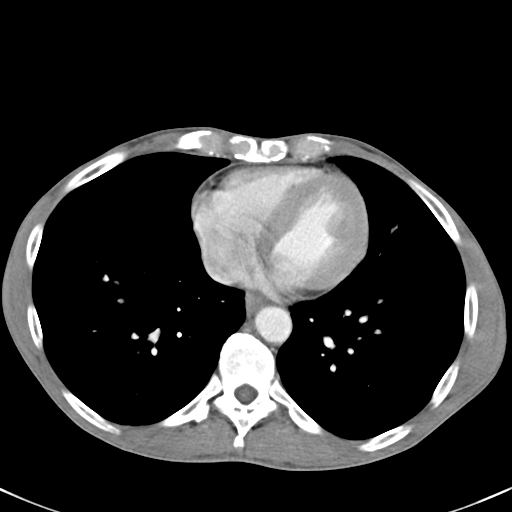
[im 36/70  soft-tissue]
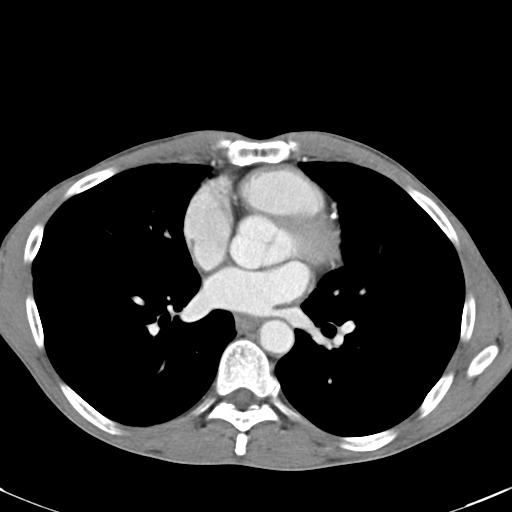
[im 41/70  soft-tissue]
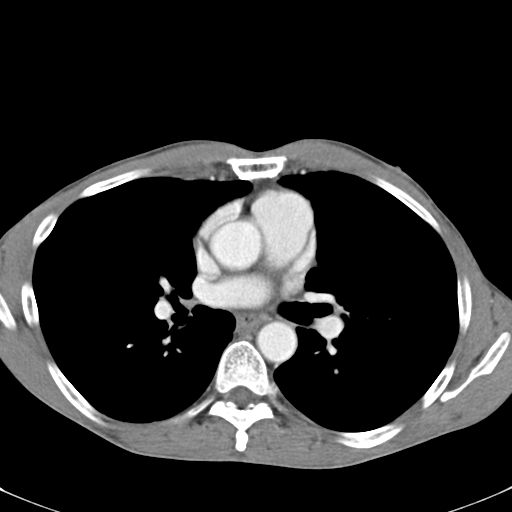
[im 45/70  soft-tissue]
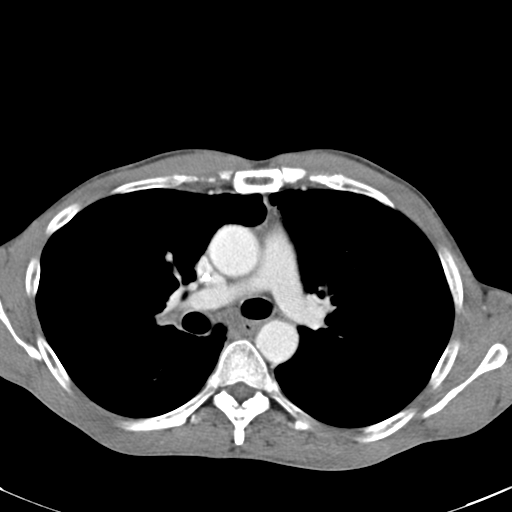
[im 45/70  bone]
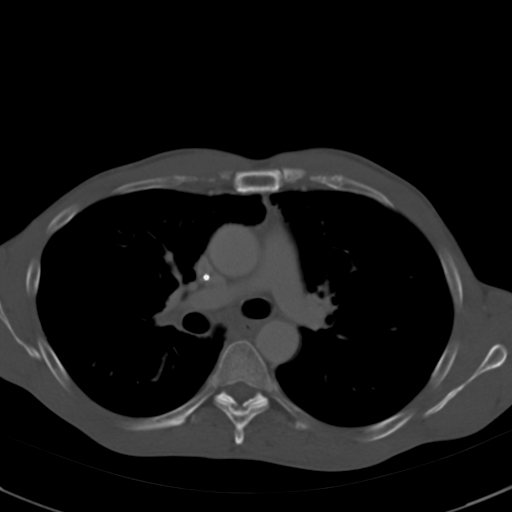
[im 49/70  soft-tissue]
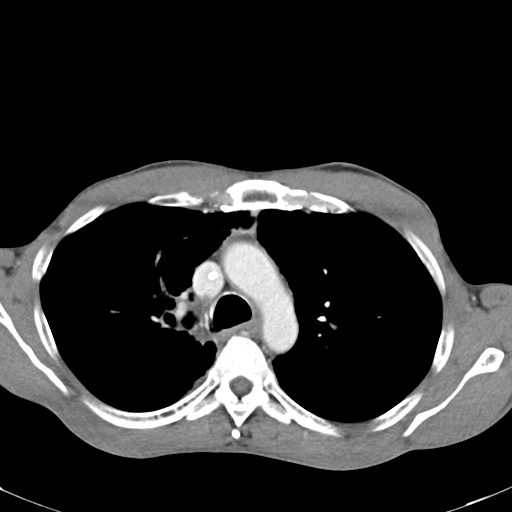
[im 56/70  soft-tissue]
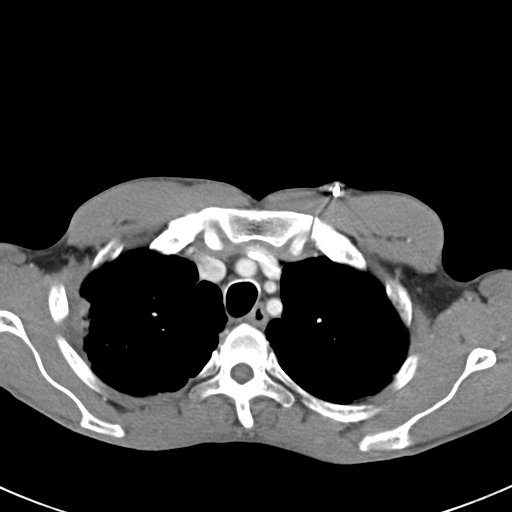
[im 61/70  soft-tissue]
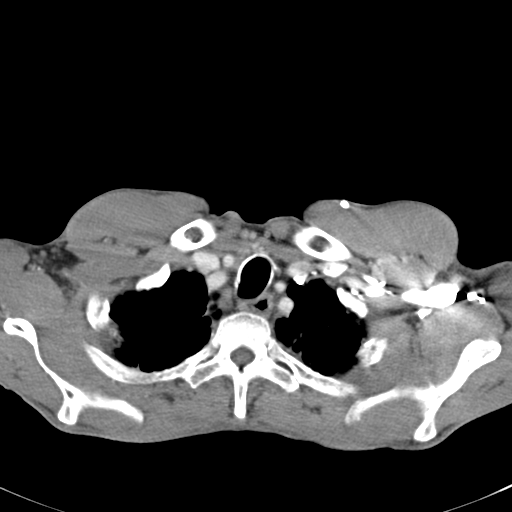
[im 65/70  soft-tissue]
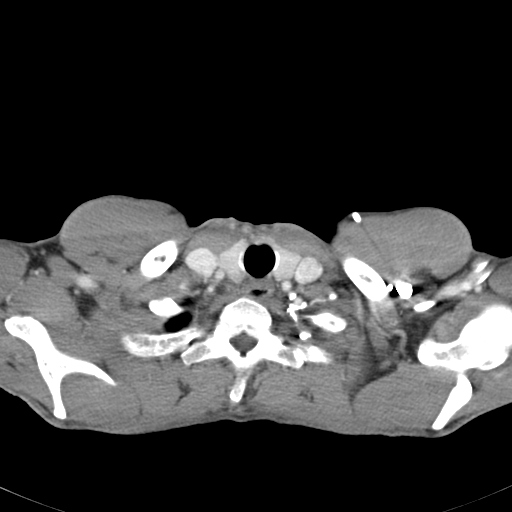

[Series 5: routine chest with cor · coronal · 0.60mm/px · 3 of 100 slices shown]
[im 34/100  soft-tissue]
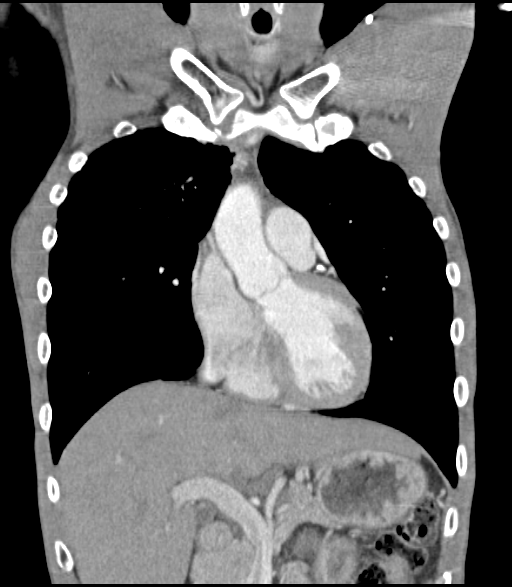
[im 45/100  soft-tissue]
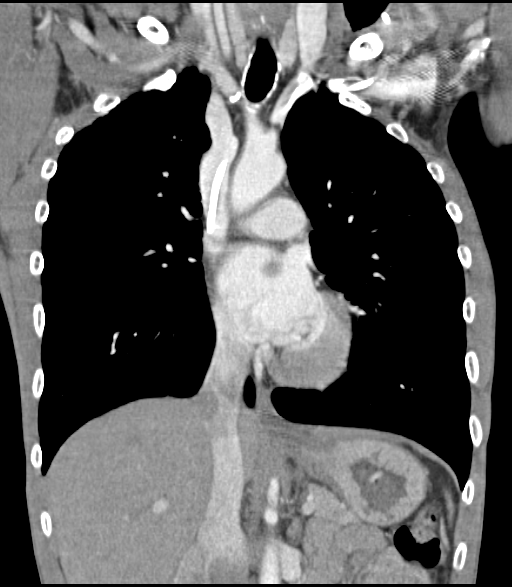
[im 56/100  soft-tissue]
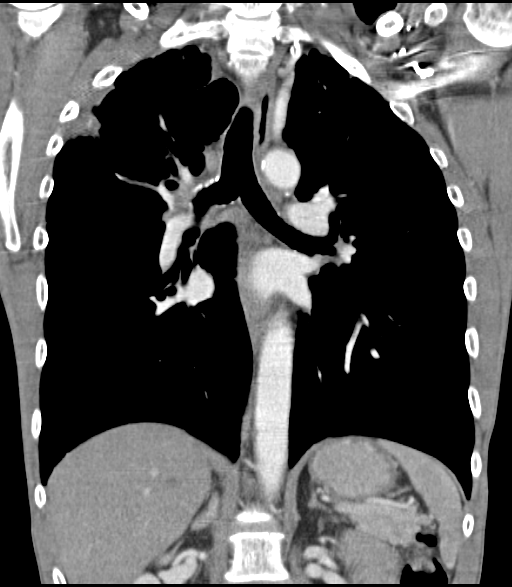

[16 of 46 positions shown; findings below may reference images not displayed]

FINDINGS: Mediastinum/Nodes: There are no enlarged mediastinal, hilar or
axillary lymph nodes.As before, there is a paucity of mediastinal
fat with diffuse esophageal wall thickening, likely related to
previous therapy. The heart size is normal. There is no pericardial
effusion. Left subclavian Port-A-Cath tip extends the mid SVC. There
is atherosclerosis of the aorta, great vessels and coronary
arteries.

Lungs/Pleura: There is no pleural effusion. Centrilobular emphysema
with prominent paraseptal components at both lung apices again
noted. There is volume loss in the right upper lobe. The dominant
subpleural right upper lobe lesion remains irregular in shape and
difficult to measure. Subjectively, this appears slightly larger
than on the most recent study, measuring up to 2.6 cm on axial image
16 of series 3. Appearance is similar to the 12/07/2014 study. The
irregular nodule along the superior aspect of the right major
fissure demonstrates progressive slow growth, measuring up to 18 x
13 mm on axial image 24 of series 3. The small ground-glass nodules
noted medially in the right lower lobe on the most recent study have
not changed (image 25). No other new or enlarging nodules
identified. There are no suspicious left lung findings.

Upper abdomen: Stable appearance. There are prominent lymph nodes
within the porta hepatis which are chronic and not hypermetabolic on
previous PET-CT.

Musculoskeletal/Chest wall: There is no chest wall mass or
suspicious osseous finding.
IMPRESSION: 1. The dominant subpleural right upper lobe lesion has not
significantly changed over the last 2 studies, consistent with
partially treated lung cancer.
2. The other hypermetabolic lesion along the superior aspect of the
right major fissure demonstrates continued slow growth, concerning
for metastasis or second primary lesion.
3. Other ill-defined ground-glass nodules in the right lung are
unchanged. No mediastinal adenopathy or other evidence of
progressive metastatic disease.

## 2016-06-18 ENCOUNTER — Telehealth: Payer: Self-pay | Admitting: *Deleted

## 2016-06-18 ENCOUNTER — Inpatient Hospital Stay: Payer: BLUE CROSS/BLUE SHIELD | Attending: Internal Medicine

## 2016-06-18 DIAGNOSIS — Z87442 Personal history of urinary calculi: Secondary | ICD-10-CM | POA: Insufficient documentation

## 2016-06-18 DIAGNOSIS — M25511 Pain in right shoulder: Secondary | ICD-10-CM | POA: Diagnosis not present

## 2016-06-18 DIAGNOSIS — G8929 Other chronic pain: Secondary | ICD-10-CM | POA: Diagnosis not present

## 2016-06-18 DIAGNOSIS — Z923 Personal history of irradiation: Secondary | ICD-10-CM | POA: Diagnosis not present

## 2016-06-18 DIAGNOSIS — Z7952 Long term (current) use of systemic steroids: Secondary | ICD-10-CM | POA: Insufficient documentation

## 2016-06-18 DIAGNOSIS — R948 Abnormal results of function studies of other organs and systems: Secondary | ICD-10-CM | POA: Diagnosis not present

## 2016-06-18 DIAGNOSIS — Z95828 Presence of other vascular implants and grafts: Secondary | ICD-10-CM

## 2016-06-18 DIAGNOSIS — Z9114 Patient's other noncompliance with medication regimen: Secondary | ICD-10-CM | POA: Diagnosis not present

## 2016-06-18 DIAGNOSIS — Z8619 Personal history of other infectious and parasitic diseases: Secondary | ICD-10-CM | POA: Diagnosis not present

## 2016-06-18 DIAGNOSIS — C3411 Malignant neoplasm of upper lobe, right bronchus or lung: Secondary | ICD-10-CM | POA: Diagnosis not present

## 2016-06-18 DIAGNOSIS — Z9221 Personal history of antineoplastic chemotherapy: Secondary | ICD-10-CM | POA: Insufficient documentation

## 2016-06-18 DIAGNOSIS — F17210 Nicotine dependence, cigarettes, uncomplicated: Secondary | ICD-10-CM | POA: Diagnosis not present

## 2016-06-18 LAB — CBC WITH DIFFERENTIAL/PLATELET
BASOS ABS: 0.1 10*3/uL (ref 0–0.1)
BASOS PCT: 1 %
EOS PCT: 5 %
Eosinophils Absolute: 0.3 10*3/uL (ref 0–0.7)
HEMATOCRIT: 39.1 % — AB (ref 40.0–52.0)
Hemoglobin: 13.1 g/dL (ref 13.0–18.0)
LYMPHS PCT: 39 %
Lymphs Abs: 2.3 10*3/uL (ref 1.0–3.6)
MCH: 29.5 pg (ref 26.0–34.0)
MCHC: 33.4 g/dL (ref 32.0–36.0)
MCV: 88.2 fL (ref 80.0–100.0)
MONO ABS: 1 10*3/uL (ref 0.2–1.0)
Monocytes Relative: 17 %
NEUTROS ABS: 2.2 10*3/uL (ref 1.4–6.5)
Neutrophils Relative %: 38 %
PLATELETS: 177 10*3/uL (ref 150–440)
RBC: 4.44 MIL/uL (ref 4.40–5.90)
RDW: 12.5 % (ref 11.5–14.5)
WBC: 5.9 10*3/uL (ref 3.8–10.6)

## 2016-06-18 LAB — COMPREHENSIVE METABOLIC PANEL
ALBUMIN: 3.7 g/dL (ref 3.5–5.0)
ALT: 172 U/L — AB (ref 17–63)
AST: 234 U/L — ABNORMAL HIGH (ref 15–41)
Alkaline Phosphatase: 50 U/L (ref 38–126)
Anion gap: 5 (ref 5–15)
BUN: 14 mg/dL (ref 6–20)
CALCIUM: 8.7 mg/dL — AB (ref 8.9–10.3)
CHLORIDE: 103 mmol/L (ref 101–111)
CO2: 26 mmol/L (ref 22–32)
CREATININE: 0.85 mg/dL (ref 0.61–1.24)
GFR calc Af Amer: >60 mL/min (ref >60)
GLUCOSE: 93 mg/dL (ref 65–99)
Potassium: 4.1 mmol/L (ref 3.5–5.1)
Sodium: 134 mmol/L — ABNORMAL LOW (ref 135–145)
Total Bilirubin: 1 mg/dL (ref 0.3–1.2)
Total Protein: 8 g/dL (ref 6.5–8.1)

## 2016-06-18 MED ORDER — SODIUM CHLORIDE 0.9% FLUSH
10.0000 mL | INTRAVENOUS | Status: DC | PRN
  Administered 2016-06-18: 10 mL via INTRAVENOUS
  Filled 2016-06-18: qty 10

## 2016-06-18 MED ORDER — HEPARIN SOD (PORK) LOCK FLUSH 100 UNIT/ML IV SOLN
500.0000 [IU] | Freq: Once | INTRAVENOUS | Status: AC
  Administered 2016-06-18: 500 [IU] via INTRAVENOUS

## 2016-06-18 NOTE — Telephone Encounter (Signed)
Per V/o Dr. Rogue Bussing - contacted patient. Arranged for a f/u apt for Monday to discuss his lab results. Pt provided an apt time for 930 am. Pt agreed verbally to show up for this apt next week.

## 2016-06-18 NOTE — Telephone Encounter (Signed)
Patient walked into the clinic this morning requesting an office visit for a follow up since he no showed his appt on 06/01/13.    I printed off the dismissal letter that was mailed to the patient on 06/01/16 that states that he has been dismissed from our practice and explained to him that he has been dismissed due to the excessive amount of no shows to appointments scheduled with Dr Rogue Bussing.    I advised the patient that we could see him within 30 days of the letter being sent but he would need to find a new provider.  Advised pt that per Dr Rogue Bussing we could get labs today (CBC,CMP) and see him in an office visit on Monday of next week if he would like, but this will be the final appt.  Patient verbalized understanding and agreeable; patient was added to lab schedule for today at 3:00.    If patient returns to the clinic today for scheduled lab appt we will add him to the md schedule on Monday, 06/25/16.

## 2016-06-20 ENCOUNTER — Ambulatory Visit
Admission: RE | Admit: 2016-06-20 | Discharge: 2016-06-20 | Disposition: A | Discharge status: Home / Self Care | Payer: BLUE CROSS/BLUE SHIELD | Source: Ambulatory Visit | Attending: Pain Medicine | Admitting: Pain Medicine

## 2016-06-20 DIAGNOSIS — M4802 Spinal stenosis, cervical region: Secondary | ICD-10-CM | POA: Insufficient documentation

## 2016-06-20 DIAGNOSIS — M47812 Spondylosis without myelopathy or radiculopathy, cervical region: Secondary | ICD-10-CM

## 2016-06-20 DIAGNOSIS — M47892 Other spondylosis, cervical region: Secondary | ICD-10-CM | POA: Diagnosis present

## 2016-06-20 DIAGNOSIS — M542 Cervicalgia: Secondary | ICD-10-CM

## 2016-06-20 DIAGNOSIS — M4722 Other spondylosis with radiculopathy, cervical region: Secondary | ICD-10-CM

## 2016-06-25 ENCOUNTER — Inpatient Hospital Stay (HOSPITAL_BASED_OUTPATIENT_CLINIC_OR_DEPARTMENT_OTHER): Payer: BLUE CROSS/BLUE SHIELD | Admitting: Internal Medicine

## 2016-06-25 VITALS — BP 119/63 | HR 68 | Temp 97.0°F | Resp 18 | Wt 118.1 lb

## 2016-06-25 DIAGNOSIS — Z923 Personal history of irradiation: Secondary | ICD-10-CM | POA: Diagnosis not present

## 2016-06-25 DIAGNOSIS — Z87442 Personal history of urinary calculi: Secondary | ICD-10-CM | POA: Diagnosis not present

## 2016-06-25 DIAGNOSIS — Z9114 Patient's other noncompliance with medication regimen: Secondary | ICD-10-CM | POA: Diagnosis not present

## 2016-06-25 DIAGNOSIS — C3411 Malignant neoplasm of upper lobe, right bronchus or lung: Secondary | ICD-10-CM

## 2016-06-25 DIAGNOSIS — Z7952 Long term (current) use of systemic steroids: Secondary | ICD-10-CM

## 2016-06-25 DIAGNOSIS — F17210 Nicotine dependence, cigarettes, uncomplicated: Secondary | ICD-10-CM | POA: Diagnosis not present

## 2016-06-25 DIAGNOSIS — Z9221 Personal history of antineoplastic chemotherapy: Secondary | ICD-10-CM | POA: Diagnosis not present

## 2016-06-25 DIAGNOSIS — R948 Abnormal results of function studies of other organs and systems: Secondary | ICD-10-CM | POA: Diagnosis not present

## 2016-06-25 DIAGNOSIS — M25511 Pain in right shoulder: Secondary | ICD-10-CM | POA: Diagnosis not present

## 2016-06-25 DIAGNOSIS — G8929 Other chronic pain: Secondary | ICD-10-CM

## 2016-06-25 DIAGNOSIS — Z8619 Personal history of other infectious and parasitic diseases: Secondary | ICD-10-CM

## 2016-06-25 NOTE — Assessment & Plan Note (Addendum)
Recurrent adenocarcinoma lung- currently on Tecentriq- tolerating well clinically no evidence of progression; Mar 17 2016- CT scan- Stable ~1.8cm nodule. Last Tecentriq on July 12th.  # HOLD tecentriq sec to elevated LFTs.   # Recommend checking CT scan in approximately 3 weeks.  # LFT elevation- AST/ALT- 3-5 times the upper limit. Not in prednisone. Patient has been referred by GI at [facility]. I had discussed previously with provider there referral patient has been noncompliant with his follow-ups- further treatment of his hepatitis C. Today patient states that he plans to be compliant with an appointment the next few weeks.  # Noncompliance- had a long discussion the patient regarding the importance of compliance in the treatment of his cancer. Previously sent a letter of dismissal; patient declines receiving this letter. Discussed with the patient that any further compliance issues he'll be dismissed from the [facility].  # Chronic pain- unrelated to cancer follow up with pain clinic.  # Follow-up with [place] in 3 weeks for labs/MD/ CT prior.

## 2016-06-25 NOTE — Progress Notes (Signed)
RN present for MD discussion with patient.

## 2016-06-25 NOTE — Progress Notes (Signed)
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   # Hepatitis C- follow up [facility]?   # Chronic pain- on oxycodone.jan 2017-Bone scan- neg.       Cancer of upper lobe of right lung ([place])   09/19/2015 Initial Diagnosis    Cancer of upper lobe of right lung ([place])        INTERVAL HISTORY:  Vague historian.  56 year old male patient with above history of  Adenocarcinoma/Recurrent currently on Tecentriq q 3W; on HOLD [since July 2017] Because of elevated LFTs is here for follow-up.  Patient had been very noncompliant with his follow-ups. He was referred to GI for his elevated LFTs. And as per GI he has not had followed up there for his treatment for his hepatitis C.   Patient is currently off his prednisone- which was used to treat his autoimmune hepatitis.  Denies any nausea vomiting abdominal pain. He continues to have chronic right shoulder pain for which is on pain medications for pain management clinic.   Given the significant noncompliance patient was sent a letter of dismissal from the clinic- he states he has not received.   REVIEW OF SYSTEMS:  No headaches or vision changes. A complete 10 point review of system is done which is negative for mentioned above in history of present illness.  PAST MEDICAL HISTORY :  Past Medical History:  Diagnosis Date  . Arthritis    . Chicken pox   . Kidney stones   . Lung cancer ([place]) 2015    PAST SURGICAL HISTORY :   Past Surgical History:  Procedure Laterality Date  . amputation of index finger  2010  . HAND SURGERY Right 2010   ORIF of right index finder, 2nd carpometacarpal joint dislocation right hand, proximal right humerus  . ORIF FEMUR FRACTURE Right 2010  . SHOULDER SURGERY Right 2010   pins and rods in shoulder    FAMILY HISTORY :  No family history on file.  SOCIAL HISTORY:   Social History  Substance Use Topics  . Smoking status: Current Every Day Smoker    Packs/day: 2.00    Years: 20.00    Types: Cigarettes  . Smokeless tobacco: Never Used  . Alcohol use 0.0 oz/week    ALLERGIES:  has No Known Allergies.  MEDICATIONS:  Current Outpatient Prescriptions  Medication Sig Dispense Refill  . Oxycodone HCl 10 MG TABS Limit one half to one tablet by mouth per day or twice per day if tolerated 60 tablet 0  . predniSONE (DELTASONE) 20 MG tablet Take 2 pills every 12 hours with food. 60 tablet 0   Current Facility-Administered Medications  Medication Dose Route Frequency Provider Last Rate Last Dose  . bupivacaine (PF) (MARCAINE) 0.25 % injection 30 mL  30 mL Other Once Mohammed Kindle, MD      . sodium chloride flush (NS)  0.9 % injection 20 mL  20 mL Other Once Mohammed Kindle, MD      . triamcinolone acetonide [facility]) injection 40 mg  40 mg Other Once Mohammed Kindle, MD       Facility-Administered Medications Ordered in Other Visits  Medication Dose Route Frequency Provider Last Rate Last Dose  . sodium chloride flush (NS) 0.9 % injection 10 mL  10 mL Intracatheter PRN Cammie Sickle, MD   10 mL at 05/26/15 1300  . sodium chloride flush (NS) 0.9 % injection 10 mL  10 mL Intravenous PRN Cammie Sickle, MD   10 mL at 07/07/15 1425    PHYSICAL EXAMINATION: ECOG PERFORMANCE STATUS: 0 - Asymptomatic  BP 119/63 (BP Location: Left Arm, Patient Position: Sitting)   Pulse 68    Temp 97 F (36.1 C) (Tympanic)   Resp 18   Wt 118 lb 2 oz (53.6 kg)   SpO2 99%   BMI 17.44 kg/m   Filed Weights   06/25/16 0944  Weight: 118 lb 2 oz (53.6 kg)    GENERAL:  Thin built/ moderately nourished; Alert, no distress and comfortable. He is alone.  EYES: no pallor or icterus OROPHARYNX: no thrush or ulceration; poor dentition  NECK: supple, no masses felt LYMPH:  no palpable lymphadenopathy in the cervical, axillary or inguinal regions LUNGS:  Decreased breath sounds bilaterally to auscultation. No wheeze or crackles HEART/CVS: regular rate & rhythm and no murmurs; No lower extremity edema ABDOMEN:abdomen soft, non-tender and normal bowel sounds Musculoskeletal:no cyanosis of digits and no clubbing  PSYCH: alert & oriented x 3 with fluent speech NEURO: no focal motor/sensory deficits SKIN:  no rashes or significant lesions  LABORATORY DATA:  I have reviewed the data as listed    Component Value Date/Time   NA 134 (L) 06/18/2016 1455   NA 136 07/27/2014 0856   K 4.1 06/18/2016 1455   K 3.9 07/27/2014 0856   CL 103 06/18/2016 1455   CL 104 07/27/2014 0856   CO2 26 06/18/2016 1455   CO2 27 07/27/2014 0856   GLUCOSE 93 06/18/2016 1455   GLUCOSE 151 (H) 07/27/2014 0856   BUN 14 06/18/2016 1455   BUN 10 07/27/2014 0856   CREATININE 0.85 06/18/2016 1455   CREATININE 0.86 07/27/2014 0856   CREATININE 0.77 07/06/2014   CALCIUM 8.7 (L) 06/18/2016 1455   CALCIUM 8.6 (L) 07/27/2014 0856   PROT 8.0 06/18/2016 1455   PROT 8.8 (H) 06/28/2014 1512   ALBUMIN 3.7 06/18/2016 1455   ALBUMIN 4.0 06/28/2014 1512   AST 234 (H) 06/18/2016 1455   AST 37 06/28/2014 1512   ALT 172 (H) 06/18/2016 1455   ALT 43 06/28/2014 1512   ALKPHOS 50 06/18/2016 1455   ALKPHOS 53 06/28/2014 1512   BILITOT 1.0 06/18/2016 1455   BILITOT 0.3 06/28/2014 1512   GFRNONAA >60 06/18/2016 1455   GFRNONAA >60 07/27/2014 0856   GFRAA >60 06/18/2016 1455   GFRAA >60 07/27/2014 0856    No results  found for: SPEP, UPEP  Lab Results  Component Value Date   WBC 5.9 06/18/2016   NEUTROABS 2.2 06/18/2016   HGB 13.1 06/18/2016   HCT 39.1 (L) 06/18/2016   MCV 88.2 06/18/2016   PLT 177 06/18/2016      Chemistry      Component Value Date/Time   NA 134 (L) 06/18/2016 1455   NA 136 07/27/2014 0856   K 4.1 06/18/2016 1455   K 3.9 07/27/2014 0856   CL  103 06/18/2016 1455   CL 104 07/27/2014 0856   CO2 26 06/18/2016 1455   CO2 27 07/27/2014 0856   BUN 14 06/18/2016 1455   BUN 10 07/27/2014 0856   CREATININE 0.85 06/18/2016 1455   CREATININE 0.86 07/27/2014 0856   CREATININE 0.77 07/06/2014      Component Value Date/Time   CALCIUM 8.7 (L) 06/18/2016 1455   CALCIUM 8.6 (L) 07/27/2014 0856   ALKPHOS 50 06/18/2016 1455   ALKPHOS 53 06/28/2014 1512   AST 234 (H) 06/18/2016 1455   AST 37 06/28/2014 1512   ALT 172 (H) 06/18/2016 1455   ALT 43 06/28/2014 1512   BILITOT 1.0 06/18/2016 1455   BILITOT 0.3 06/28/2014 1512      ASSESSMENT & PLAN:   Cancer of upper lobe of right lung ([facility]) Recurrent adenocarcinoma lung- currently on Tecentriq- tolerating well clinically no evidence of progression; Mar 17 2016- CT scan- Stable ~1.8cm nodule. Last Tecentriq on July 12th.  # HOLD tecentriq sec to elevated LFTs.   # Recommend checking CT scan in approximately 3 weeks.  # LFT elevation- AST/ALT- 3-5 times the upper limit. Not in prednisone. Patient has been referred by GI at [facility]. I had discussed previously with provider there referral patient has been noncompliant with his follow-ups- further treatment of his hepatitis C. Today patient states that he plans to be compliant with an appointment the next few weeks.  # Noncompliance- had a long discussion the patient regarding the importance of compliance in the treatment of his cancer. Previously sent a letter of dismissal; patient declines receiving this letter. Discussed with the patient that any further compliance issues he'll be dismissed  from the [facility].  # Chronic pain- unrelated to cancer follow up with pain clinic.  # Follow-up with [place] in 3 weeks for labs/MD/ CT prior.   Cammie Sickle, MD 06/26/2016 8:39 AM

## 2016-06-25 NOTE — Progress Notes (Signed)
Patient here today for follow up.  Patient states no new concerns today  

## 2016-07-16 ENCOUNTER — Ambulatory Visit
Admission: RE | Admit: 2016-07-16 | Discharge: 2016-07-16 | Disposition: A | Discharge status: Home / Self Care | Payer: BLUE CROSS/BLUE SHIELD | Source: Ambulatory Visit | Attending: Internal Medicine | Admitting: Internal Medicine

## 2016-07-16 DIAGNOSIS — C3411 Malignant neoplasm of upper lobe, right bronchus or lung: Secondary | ICD-10-CM | POA: Insufficient documentation

## 2016-07-16 MED ORDER — IOPAMIDOL (ISOVUE-300) INJECTION 61%
75.0000 mL | Freq: Once | INTRAVENOUS | Status: AC | PRN
  Administered 2016-07-16: 75 mL via INTRAVENOUS

## 2016-07-19 ENCOUNTER — Inpatient Hospital Stay: Payer: BLUE CROSS/BLUE SHIELD | Attending: Internal Medicine | Admitting: Internal Medicine

## 2016-07-19 VITALS — BP 122/57 | HR 69 | Temp 97.7°F | Resp 16 | Wt 116.4 lb

## 2016-07-19 DIAGNOSIS — Z79899 Other long term (current) drug therapy: Secondary | ICD-10-CM | POA: Diagnosis not present

## 2016-07-19 DIAGNOSIS — R7989 Other specified abnormal findings of blood chemistry: Secondary | ICD-10-CM | POA: Insufficient documentation

## 2016-07-19 DIAGNOSIS — C3411 Malignant neoplasm of upper lobe, right bronchus or lung: Secondary | ICD-10-CM | POA: Diagnosis not present

## 2016-07-19 DIAGNOSIS — B1920 Unspecified viral hepatitis C without hepatic coma: Secondary | ICD-10-CM | POA: Insufficient documentation

## 2016-07-19 DIAGNOSIS — Z923 Personal history of irradiation: Secondary | ICD-10-CM | POA: Diagnosis not present

## 2016-07-19 DIAGNOSIS — F17210 Nicotine dependence, cigarettes, uncomplicated: Secondary | ICD-10-CM | POA: Insufficient documentation

## 2016-07-19 DIAGNOSIS — Z9119 Patient's noncompliance with other medical treatment and regimen: Secondary | ICD-10-CM | POA: Diagnosis not present

## 2016-07-19 DIAGNOSIS — M25511 Pain in right shoulder: Secondary | ICD-10-CM | POA: Insufficient documentation

## 2016-07-19 DIAGNOSIS — G8929 Other chronic pain: Secondary | ICD-10-CM | POA: Insufficient documentation

## 2016-07-19 DIAGNOSIS — Z87442 Personal history of urinary calculi: Secondary | ICD-10-CM | POA: Insufficient documentation

## 2016-07-19 DIAGNOSIS — Z9221 Personal history of antineoplastic chemotherapy: Secondary | ICD-10-CM | POA: Insufficient documentation

## 2016-07-19 MED ORDER — VARENICLINE TARTRATE 0.5 MG X 11 & 1 MG X 42 PO MISC: ORAL | 0 refills | Status: DC

## 2016-07-19 NOTE — Progress Notes (Signed)
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   # Hepatitis C- follow up [facility]?   # Chronic pain- on oxycodone.jan 2017-Bone scan- neg.       Cancer of upper lobe of right lung ([place])   09/19/2015 Initial Diagnosis    Cancer of upper lobe of right lung ([place])        INTERVAL HISTORY:  Vague historian.  56 year old male patient with above history of  Adenocarcinoma/Recurrent currently on Tecentriq q 3W; on HOLD [since July 2017] Because of elevated LFTs is here for follow-up/To review the results of his restaging CAT scan.  Patient states that he is awaiting to follow-up with GI for his elevated LFTs/hep C treatment.   Denies any nausea vomiting abdominal pain. He continues to have chronic right shoulder pain for which is on pain medications for pain management clinic. Denies any headaches. Or vision changes.  REVIEW OF SYSTEMS:  No headaches or vision changes. A complete 10 point review of system is done which is negative for mentioned above in history of present illness.  PAST MEDICAL HISTORY :  Past Medical History:  Diagnosis Date  . Arthritis   . Chicken pox   . Kidney stones   . Lung cancer ([place]) 2015    PAST SURGICAL HISTORY :   Past Surgical History:  Procedure Laterality Date  . amputation of index finger  2010  . HAND SURGERY Right 2010    ORIF of right index finder, 2nd carpometacarpal joint dislocation right hand, proximal right humerus  . ORIF FEMUR FRACTURE Right 2010  . SHOULDER SURGERY Right 2010   pins and rods in shoulder    FAMILY HISTORY :  No family history on file.  SOCIAL HISTORY:   Social History  Substance Use Topics  . Smoking status: Current Every Day Smoker    Packs/day: 2.00    Years: 20.00    Types: Cigarettes  . Smokeless tobacco: Never Used  . Alcohol use 0.0 oz/week    ALLERGIES:  has No Known Allergies.  MEDICATIONS:  Current Outpatient Prescriptions  Medication Sig Dispense Refill  . Oxycodone HCl 10 MG TABS Limit one half to one tablet by mouth per day or twice per day if tolerated 60 tablet 0  . predniSONE (DELTASONE) 20 MG tablet Take 2 pills every 12 hours with food. (Patient not taking: Reported on 07/19/2016) 60 tablet 0  . varenicline (CHANTIX STARTING MONTH PAK) 0.5 MG X 11 & 1 MG X 42 tablet Take one 0.5 mg tablet by mouth once daily for 3 days, then increase to one 0.5 mg tablet twice daily for 4 days, then increase to one 1 mg tablet twice daily. 53 tablet 0   Current Facility-Administered Medications  Medication Dose Route Frequency Provider Last Rate Last Dose  . bupivacaine (PF) (MARCAINE) 0.25 % injection 30 mL  30 mL Other Once Mohammed Kindle, MD      . sodium chloride flush (NS) 0.9 % injection 20 mL  20 mL Other Once Mohammed Kindle, MD      . triamcinolone acetonide [facility]) injection 40 mg  40 mg Other Once Mohammed Kindle, MD       Facility-Administered Medications Ordered in Other Visits  Medication Dose Route Frequency Provider Last Rate Last Dose  . sodium chloride flush (NS) 0.9 % injection 10 mL  10 mL Intracatheter PRN Cammie Sickle, MD   10 mL at 05/26/15 1300  . sodium chloride flush (NS) 0.9 % injection 10 mL  10 mL Intravenous PRN Cammie Sickle, MD   10 mL at 07/07/15 1425    PHYSICAL EXAMINATION: ECOG PERFORMANCE STATUS: 0 -  Asymptomatic  BP (!) 122/57 (BP Location: Left Arm, Patient Position: Sitting)   Pulse 69   Temp 97.7 F (36.5 C) (Tympanic)   Resp 16   Wt 116 lb 6 oz (52.8 kg)   SpO2 99%   BMI 17.19 kg/m   Filed Weights   07/19/16 1123  Weight: 116 lb 6 oz (52.8 kg)    GENERAL:  Thin built/ moderately nourished; Alert, no distress and comfortable. He is alone.  EYES: no pallor or icterus OROPHARYNX: no thrush or ulceration; poor dentition  NECK: supple, no masses felt LYMPH:  no palpable lymphadenopathy in the cervical, axillary or inguinal regions LUNGS:  Decreased breath sounds bilaterally to auscultation. No wheeze or crackles HEART/CVS: regular rate & rhythm and no murmurs; No lower extremity edema ABDOMEN:abdomen soft, non-tender and normal bowel sounds Musculoskeletal:no cyanosis of digits and no clubbing  PSYCH: alert & oriented x 3 with fluent speech NEURO: no focal motor/sensory deficits SKIN:  no rashes or significant lesions  LABORATORY DATA:  I have reviewed the data as listed    Component Value Date/Time   NA 134 (L) 06/18/2016 1455   NA 136 07/27/2014 0856   K 4.1 06/18/2016 1455   K 3.9 07/27/2014 0856   CL 103 06/18/2016 1455   CL 104 07/27/2014 0856   CO2 26 06/18/2016 1455   CO2 27 07/27/2014 0856   GLUCOSE 93 06/18/2016 1455   GLUCOSE 151 (H) 07/27/2014 0856   BUN 14 06/18/2016 1455   BUN 10 07/27/2014 0856   CREATININE 0.85 06/18/2016 1455   CREATININE 0.86 07/27/2014 0856   CREATININE 0.77 07/06/2014   CALCIUM 8.7 (L) 06/18/2016 1455   CALCIUM 8.6 (L) 07/27/2014 0856   PROT 8.0 06/18/2016 1455   PROT 8.8 (H) 06/28/2014 1512   ALBUMIN 3.7 06/18/2016 1455   ALBUMIN 4.0 06/28/2014 1512   AST 234 (H) 06/18/2016 1455   AST 37 06/28/2014 1512   ALT 172 (H) 06/18/2016 1455   ALT 43 06/28/2014 1512   ALKPHOS 50 06/18/2016 1455   ALKPHOS 53 06/28/2014 1512   BILITOT 1.0 06/18/2016 1455   BILITOT 0.3 06/28/2014 1512   GFRNONAA >60 06/18/2016 1455    GFRNONAA >60 07/27/2014 0856   GFRAA >60 06/18/2016 1455   GFRAA >60 07/27/2014 0856    No results found for: SPEP, UPEP  Lab Results  Component Value Date   WBC 5.9 06/18/2016   NEUTROABS 2.2 06/18/2016   HGB 13.1 06/18/2016   HCT 39.1 (L) 06/18/2016   MCV 88.2 06/18/2016   PLT 177 06/18/2016      Chemistry      Component Value Date/Time   NA 134 (L) 06/18/2016 1455   NA  136 07/27/2014 0856   K 4.1 06/18/2016 1455   K 3.9 07/27/2014 0856   CL 103 06/18/2016 1455   CL 104 07/27/2014 0856   CO2 26 06/18/2016 1455   CO2 27 07/27/2014 0856   BUN 14 06/18/2016 1455   BUN 10 07/27/2014 0856   CREATININE 0.85 06/18/2016 1455   CREATININE 0.86 07/27/2014 0856   CREATININE 0.77 07/06/2014      Component Value Date/Time   CALCIUM 8.7 (L) 06/18/2016 1455   CALCIUM 8.6 (L) 07/27/2014 0856   ALKPHOS 50 06/18/2016 1455   ALKPHOS 53 06/28/2014 1512   AST 234 (H) 06/18/2016 1455   AST 37 06/28/2014 1512   ALT 172 (H) 06/18/2016 1455   ALT 43 06/28/2014 1512   BILITOT 1.0 06/18/2016 1455   BILITOT 0.3 06/28/2014 1512      ASSESSMENT & PLAN:   Cancer of upper lobe of right lung [facility]) Recurrent adenocarcinoma lung-s/p [place] July 2017- held sec to elevated LFTs] April 17th 2018- CT scan- slight progression- RUL pleural based nodule/ RUL lesion ~17 mm. Questionable slight progression of the mediastinal adenopathy. Recommend a PET scan for further evaluation.  #  LFT elevation- AST/ALT- 3-5 times the upper limit/improving. Question autoimmune side effect of chemotherapy versus others [history of hep C]; being evaluated by hepatology.   # History of noncompliance/ elevated LFTs question autoimmune hepatitis from immunotherapy- could be a barrier to further systemic therapies.   # Chronic pain- unrelated to cancer follow up with pain clinic.  # Follow-up with [place] in 2 weeks for labs/MD/ PET prior.  # I reviewed the blood work- with the patient in detail; also reviewed  the imaging independently [as summarized above]; and with the patient in detail.    Cammie Sickle, MD 07/22/2016 7:26 PM

## 2016-07-19 NOTE — Assessment & Plan Note (Addendum)
Recurrent adenocarcinoma lung-s/p Ria Bush July 2017- held sec to elevated LFTs] April 17th 2018- CT scan- slight progression- RUL pleural based nodule/ RUL lesion ~17 mm. Questionable slight progression of the mediastinal adenopathy. Recommend a PET scan for further evaluation.  #  LFT elevation- AST/ALT- 3-5 times the upper limit/improving. Question autoimmune side effect of chemotherapy versus others [history of hep C]; being evaluated by hepatology.   # History of noncompliance/ elevated LFTs question autoimmune hepatitis from immunotherapy- could be a barrier to further systemic therapies.   # Chronic pain- unrelated to cancer follow up with pain clinic.  # Follow-up with [place] in 2 weeks for labs/MD/ PET prior.  # I reviewed the blood work- with the patient in detail; also reviewed the imaging independently [as summarized above]; and with the patient in detail.

## 2016-07-19 NOTE — Progress Notes (Signed)
Patient here today for follow up.   

## 2016-08-02 ENCOUNTER — Ambulatory Visit: Payer: BLUE CROSS/BLUE SHIELD | Admitting: Internal Medicine

## 2016-08-03 ENCOUNTER — Ambulatory Visit
Admission: RE | Admit: 2016-08-03 | Discharge: 2016-08-03 | Disposition: A | Discharge status: Home / Self Care | Payer: BLUE CROSS/BLUE SHIELD | Source: Ambulatory Visit | Attending: Internal Medicine | Admitting: Internal Medicine

## 2016-08-03 DIAGNOSIS — Z923 Personal history of irradiation: Secondary | ICD-10-CM | POA: Diagnosis not present

## 2016-08-03 DIAGNOSIS — N1330 Unspecified hydronephrosis: Secondary | ICD-10-CM | POA: Insufficient documentation

## 2016-08-03 DIAGNOSIS — C3411 Malignant neoplasm of upper lobe, right bronchus or lung: Secondary | ICD-10-CM | POA: Diagnosis present

## 2016-08-03 LAB — GLUCOSE, CAPILLARY: GLUCOSE-CAPILLARY: 92 mg/dL (ref 65–99)

## 2016-08-03 MED ORDER — FLUDEOXYGLUCOSE F - 18 (FDG) INJECTION
12.9300 | Freq: Once | INTRAVENOUS | Status: AC | PRN
  Administered 2016-08-03: 12.93 via INTRAVENOUS

## 2016-08-06 ENCOUNTER — Inpatient Hospital Stay: Payer: BLUE CROSS/BLUE SHIELD | Attending: Internal Medicine | Admitting: Internal Medicine

## 2016-08-06 VITALS — BP 128/75 | HR 55 | Temp 97.6°F | Resp 20 | Ht 69.0 in | Wt 118.0 lb

## 2016-08-06 DIAGNOSIS — R948 Abnormal results of function studies of other organs and systems: Secondary | ICD-10-CM

## 2016-08-06 DIAGNOSIS — Z923 Personal history of irradiation: Secondary | ICD-10-CM

## 2016-08-06 DIAGNOSIS — M25511 Pain in right shoulder: Secondary | ICD-10-CM

## 2016-08-06 DIAGNOSIS — Z8619 Personal history of other infectious and parasitic diseases: Secondary | ICD-10-CM

## 2016-08-06 DIAGNOSIS — F17210 Nicotine dependence, cigarettes, uncomplicated: Secondary | ICD-10-CM | POA: Diagnosis not present

## 2016-08-06 DIAGNOSIS — Z87442 Personal history of urinary calculi: Secondary | ICD-10-CM

## 2016-08-06 DIAGNOSIS — G8929 Other chronic pain: Secondary | ICD-10-CM

## 2016-08-06 DIAGNOSIS — Z79899 Other long term (current) drug therapy: Secondary | ICD-10-CM

## 2016-08-06 DIAGNOSIS — Z9221 Personal history of antineoplastic chemotherapy: Secondary | ICD-10-CM | POA: Diagnosis not present

## 2016-08-06 DIAGNOSIS — C3411 Malignant neoplasm of upper lobe, right bronchus or lung: Secondary | ICD-10-CM | POA: Diagnosis present

## 2016-08-06 NOTE — Progress Notes (Signed)
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       Cancer of upper lobe of right lung ([place])   09/19/2015 Initial Diagnosis    Cancer of upper lobe of right lung ([place])        INTERVAL HISTORY:  Vague historian.  56 year old male patient with above history of  Adenocarcinoma/Recurrent currently on Tecentriq q 3W; on HOLD [since July 2017] Because of elevated LFTs is here for follow-up/To review the results of his PET scan/given his abnormal CT scan.  In the interim patient was evaluated at GI; given his complicated history of malignancy/difficulty with adherence to recommendations- he has been referred to [facility] for further evaluation of his liver issues. He has not had any appointments yet  Denies any new onset or shortness of breath or cough or hemoptysis. Denies any nausea vomiting abdominal pain. He continues to have chronic right shoulder pain for which is on pain medications for pain management clinic. Denies any headaches. Or vision changes.  REVIEW OF SYSTEMS:  No headaches or vision changes. A complete 10 point review of system is done which is negative for  mentioned above in history of present illness.  PAST MEDICAL HISTORY :  Past Medical History:  Diagnosis Date  . Arthritis   . Chicken pox   . Kidney stones   . Lung cancer ([place]) 2015    PAST SURGICAL HISTORY :   Past Surgical History:  Procedure Laterality Date  . amputation of index finger  2010  . HAND SURGERY Right 2010   ORIF of right index finder, 2nd carpometacarpal joint dislocation right hand, proximal right humerus  . ORIF FEMUR FRACTURE Right 2010  . SHOULDER SURGERY Right 2010   pins and rods in shoulder    FAMILY HISTORY :  No family history on file.  SOCIAL HISTORY:   Social History  Substance Use Topics  . Smoking status: Current Every Day Smoker    Packs/day: 2.00    Years: 20.00    Types: Cigarettes  . Smokeless tobacco: Never Used  . Alcohol use 0.0 oz/week    ALLERGIES:  has No Known Allergies.  MEDICATIONS:  Current Outpatient Prescriptions  Medication Sig Dispense Refill  . Oxycodone HCl 10 MG TABS Limit one half to one tablet by mouth per day or twice per day if tolerated 60 tablet 0  . varenicline (CHANTIX STARTING MONTH PAK) 0.5 MG X 11 & 1 MG X 42 tablet Take one 0.5 mg tablet by mouth once daily for 3 days, then increase to one 0.5 mg tablet twice daily for 4 days, then increase to one 1 mg tablet twice daily. (Patient not taking:  Reported on 08/06/2016) 53 tablet 0   Current Facility-Administered Medications  Medication Dose Route Frequency Provider Last Rate Last Dose  . bupivacaine (PF) (MARCAINE) 0.25 % injection 30 mL  30 mL Other Once Mohammed Kindle, MD      . sodium chloride flush (NS) 0.9 % injection 20 mL  20 mL Other Once Mohammed Kindle, MD       Facility-Administered Medications Ordered in Other Visits  Medication Dose Route Frequency Provider Last Rate Last Dose  . sodium chloride flush (NS) 0.9 % injection 10 mL  10 mL Intracatheter PRN Cammie Sickle, MD   10 mL at 05/26/15 1300  . sodium chloride flush (NS) 0.9 %  injection 10 mL  10 mL Intravenous PRN Cammie Sickle, MD   10 mL at 07/07/15 1425    PHYSICAL EXAMINATION: ECOG PERFORMANCE STATUS: 0 - Asymptomatic  BP 128/75 (BP Location: Left Arm, Patient Position: Sitting)   Pulse (!) 55   Temp 97.6 F (36.4 C) (Tympanic)   Resp 20   Ht '5\' 9"'$  (1.753 m)   Wt 118 lb (53.5 kg)   BMI 17.43 kg/m   Filed Weights   08/06/16 0930  Weight: 118 lb (53.5 kg)    GENERAL:  Thin built/ moderately nourished; Alert, no distress and comfortable. He is alone.  EYES: no pallor or icterus OROPHARYNX: no thrush or ulceration; poor dentition  NECK: supple, no masses felt LYMPH:  no palpable lymphadenopathy in the cervical, axillary or inguinal regions LUNGS:  Decreased breath sounds bilaterally to auscultation. No wheeze or crackles HEART/CVS: regular rate & rhythm and no murmurs; No lower extremity edema ABDOMEN:abdomen soft, non-tender and normal bowel sounds Musculoskeletal:no cyanosis of digits and no clubbing  PSYCH: alert & oriented x 3 with fluent speech NEURO: no focal motor/sensory deficits SKIN:  no rashes or significant lesions  LABORATORY DATA:  I have reviewed the data as listed    Component Value Date/Time   NA 134 (L) 06/18/2016 1455   NA 136 07/27/2014 0856   K 4.1 06/18/2016 1455   K 3.9 07/27/2014 0856   CL 103 06/18/2016 1455   CL 104 07/27/2014 0856   CO2 26 06/18/2016 1455   CO2 27 07/27/2014 0856   GLUCOSE 93 06/18/2016 1455   GLUCOSE 151 (H) 07/27/2014 0856   BUN 14 06/18/2016 1455   BUN 10 07/27/2014 0856   CREATININE 0.85 06/18/2016 1455   CREATININE 0.86 07/27/2014 0856   CREATININE 0.77 07/06/2014   CALCIUM 8.7 (L) 06/18/2016 1455   CALCIUM 8.6 (L) 07/27/2014 0856   PROT 8.0 06/18/2016 1455   PROT 8.8 (H) 06/28/2014 1512   ALBUMIN 3.7 06/18/2016 1455   ALBUMIN 4.0 06/28/2014 1512   AST 234 (H) 06/18/2016 1455   AST 37 06/28/2014 1512   ALT 172 (H) 06/18/2016 1455   ALT 43 06/28/2014 1512   ALKPHOS 50  06/18/2016 1455   ALKPHOS 53 06/28/2014 1512   BILITOT 1.0 06/18/2016 1455   BILITOT 0.3 06/28/2014 1512   GFRNONAA >60 06/18/2016 1455   GFRNONAA >60 07/27/2014 0856   GFRAA >60 06/18/2016 1455   GFRAA >60 07/27/2014 0856    No results found for: SPEP, UPEP  Lab Results  Component Value Date   WBC 5.9 06/18/2016   NEUTROABS 2.2 06/18/2016   HGB 13.1 06/18/2016   HCT 39.1 (L) 06/18/2016   MCV 88.2 06/18/2016   PLT 177 06/18/2016      Chemistry      Component Value  Date/Time   NA 134 (L) 06/18/2016 1455   NA 136 07/27/2014 0856   K 4.1 06/18/2016 1455   K 3.9 07/27/2014 0856   CL 103 06/18/2016 1455   CL 104 07/27/2014 0856   CO2 26 06/18/2016 1455   CO2 27 07/27/2014 0856   BUN 14 06/18/2016 1455   BUN 10 07/27/2014 0856   CREATININE 0.85 06/18/2016 1455   CREATININE 0.86 07/27/2014 0856   CREATININE 0.77 07/06/2014      Component Value Date/Time   CALCIUM 8.7 (L) 06/18/2016 1455   CALCIUM 8.6 (L) 07/27/2014 0856   ALKPHOS 50 06/18/2016 1455   ALKPHOS 53 06/28/2014 1512   AST 234 (H) 06/18/2016 1455   AST 37 06/28/2014 1512   ALT 172 (H) 06/18/2016 1455   ALT 43 06/28/2014 1512   BILITOT 1.0 06/18/2016 1455   BILITOT 0.3 06/28/2014 1512      ASSESSMENT & PLAN:   Cancer of upper lobe of right lung [facility]) Recurrent adenocarcinoma lung-s/p Ria Bush July 2017- held sec to elevated LFTs] April 17th 2018- CT scan- slight progression- RUL pleural based nodule/ RUL lesion ~17 mm. May 3rd PET- RUL concerning for RUL nodule progression; Scarring at RUL.   # Given the elevated LFTs [C discussion below]; oligo-progression- recommend evaluation with radiation oncology/SBR T discussed with Dr. Donella Stade. Also discussed that we will review the imaging at tumor conference.   #  LFT elevation- AST/ALT- 3-5 times the upper limit/improving. Question autoimmune side effect of immunotherapy versus others [history of hep C]; awaiting referral to [facility] given complexity of the  case. Also discussed re: [place] Bx for liver if needed. Reviewed the note from GI/[facility].   # Chronic pain- unrelated to cancer follow up with pain clinic.  # follow up in 3 weeks or so/ cbc-cmp. Referral to Dr.Crystal.   # I reviewed the blood work- with the patient in detail; also reviewed the imaging independently [as summarized above]; and with the patient in detail.    Cammie Sickle, MD 08/06/2016 10:52 AM

## 2016-08-06 NOTE — Assessment & Plan Note (Addendum)
Recurrent adenocarcinoma lung-s/p Ria Bush July 2017- held sec to elevated LFTs] April 17th 2018- CT scan- slight progression- RUL pleural based nodule/ RUL lesion ~17 mm. May 3rd PET- RUL concerning for RUL nodule progression; Scarring at RUL.   # Given the elevated LFTs [C discussion below]; oligo-progression- recommend evaluation with radiation oncology/SBR T discussed with Dr. Donella Stade. Also discussed that we will review the imaging at tumor conference.   #  LFT elevation- AST/ALT- 3-5 times the upper limit/improving. Question autoimmune side effect of immunotherapy versus others [history of hep C]; awaiting referral to [facility] given complexity of the case. Also discussed re: [place] Bx for liver if needed. Reviewed the note from GI/[facility].   # Chronic pain- unrelated to cancer follow up with pain clinic.  # follow up in 3 weeks or so/ cbc-cmp. Referral to Dr.Crystal.   # I reviewed the blood work- with the patient in detail; also reviewed the imaging independently [as summarized above]; and with the patient in detail.

## 2016-08-09 ENCOUNTER — Telehealth: Payer: Self-pay | Admitting: Internal Medicine

## 2016-08-09 DIAGNOSIS — C3411 Malignant neoplasm of upper lobe, right bronchus or lung: Secondary | ICD-10-CM

## 2016-08-09 NOTE — Telephone Encounter (Signed)
Spoke with pt. He is agreeable to have a ct guided lung biopsy. He states that "I'm also concerned that this is disease progression." He would like to keep his apt with Dr. Baruch Gouty on Monday 08/13/16 as scheduled. He would like the sch. team to set up a biospy  in the afternoon. He states that he works 3rd shift and early mornings do not work for him. He states that he prefers the procedure to be "scheduled at 2pm." I explained to him that the procedures for ct lung bxs are generally performed early to mid mornings. He states that "if I have to I will just take the day off", but he prefers afternoon appointments.  [facility] scheduling worksheet faxed to sch. Team. Pending-date of biopsy.

## 2016-08-09 NOTE — Telephone Encounter (Signed)
Please inform patient that his case at tumor conference. It was recommended that he needs a CT-guided biopsy of the right lung lesion. Pt also has appt with Dr.Crystal on 5/14.

## 2016-08-13 ENCOUNTER — Ambulatory Visit
Admission: RE | Admit: 2016-08-13 | Discharge: 2016-08-13 | Disposition: A | Discharge status: Home / Self Care | Payer: BLUE CROSS/BLUE SHIELD | Source: Ambulatory Visit | Attending: Radiation Oncology | Admitting: Radiation Oncology

## 2016-08-13 ENCOUNTER — Encounter: Payer: Self-pay | Admitting: Radiation Oncology

## 2016-08-13 VITALS — BP 154/78 | HR 89 | Temp 95.5°F | Resp 20 | Wt 115.3 lb

## 2016-08-13 DIAGNOSIS — F17210 Nicotine dependence, cigarettes, uncomplicated: Secondary | ICD-10-CM | POA: Insufficient documentation

## 2016-08-13 DIAGNOSIS — Z923 Personal history of irradiation: Secondary | ICD-10-CM | POA: Diagnosis not present

## 2016-08-13 DIAGNOSIS — C7801 Secondary malignant neoplasm of right lung: Secondary | ICD-10-CM | POA: Insufficient documentation

## 2016-08-13 DIAGNOSIS — Z510 Encounter for antineoplastic radiation therapy: Secondary | ICD-10-CM | POA: Insufficient documentation

## 2016-08-13 DIAGNOSIS — R05 Cough: Secondary | ICD-10-CM | POA: Diagnosis not present

## 2016-08-13 DIAGNOSIS — Z9221 Personal history of antineoplastic chemotherapy: Secondary | ICD-10-CM | POA: Diagnosis not present

## 2016-08-13 DIAGNOSIS — C3411 Malignant neoplasm of upper lobe, right bronchus or lung: Secondary | ICD-10-CM | POA: Insufficient documentation

## 2016-08-13 NOTE — Progress Notes (Signed)
Radiation Oncology Follow up Note old patient new area new right lung lesion  Name: Mario [name]   Date:   08/13/2016 MRN:  [id number] DOB: 16-Oct-1960    This 56 y.o. male presents to the clinic today for evaluation of new nodule in his right midlung field status post combined modality treatment for stage III adenocarcinoma the right upper lobe.  REFERRING PROVIDER: Tracie Harrier, MD  HPI: Patient is a 56 year old male well-known to department having been treated back in March 2016 with combined modality treatment with chemoradiation for stage III adenocarcinoma the right upper lobe. He has developed recurrent disease with a progressive lesion in the right midlung region noted to be progressing enlarging with hypermetabolic activity on PET CT scan which was performed last month. He also has some increased liver function tests and has been on Tecentriq. He has been on oxycodone for bone pain although his bone scan is been negative. He recently was presented in tumor conference went PET CT scan performed May 2018 showed nodule along the right major fissure grown since previous PET/CT with increasing hypermetabolic activity. It was the opinion of the conference to go ahead with SB RT treatment to that area P is fairly asymptomatic has a mild nonproductive cough no hemoptysis chest tightness or marked shortness of breath.  COMPLICATIONS OF TREATMENT: none  FOLLOW UP COMPLIANCE: keeps appointments   PHYSICAL EXAM:  BP (!) 154/78   Pulse 89   Temp (!) 95.5 F (35.3 C)   Resp 20   Wt 115 lb 4.8 oz (52.3 kg)   BMI 17.03 kg/m  Well-developed well-nourished patient in NAD. HEENT reveals PERLA, EOMI, discs not visualized.  Oral cavity is clear. No oral mucosal lesions are identified. Neck is clear without evidence of cervical or supraclavicular adenopathy. Lungs are clear to A&P. Cardiac examination is essentially unremarkable with regular rate and rhythm without murmur rub or thrill.  Abdomen is benign with no organomegaly or masses noted. Motor sensory and DTR levels are equal and symmetric in the upper and lower extremities. Cranial nerves II through XII are grossly intact. Proprioception is intact. No peripheral adenopathy or edema is identified. No motor or sensory levels are noted. Crude visual fields are within normal range.  RADIOLOGY RESULTS: Serial PET CTs are reviewed and compatible with the above-stated findings  PLAN: At this time I to go ahead with SB RT to his right middle lobe lesion. Would plan on delivering 5000 cGy in 5 fractions using 4D CT study as well as PET fusion for treatment planning. I personally ordered CT simulation for early next week. Risks and benefits of treatment including possible development of worsening cough fatigue skin reaction possible radiation esophagitis all were discussed in detail with the patient. He seems to comprehend my treatment plan well.  I would like to take this opportunity to thank you for allowing me to participate in the care of your patient.Armstead Peaks., MD

## 2016-08-21 ENCOUNTER — Ambulatory Visit
Admission: RE | Admit: 2016-08-21 | Discharge: 2016-08-21 | Disposition: A | Discharge status: Home / Self Care | Payer: BLUE CROSS/BLUE SHIELD | Source: Ambulatory Visit | Attending: Radiation Oncology | Admitting: Radiation Oncology

## 2016-08-21 DIAGNOSIS — C7801 Secondary malignant neoplasm of right lung: Secondary | ICD-10-CM | POA: Diagnosis not present

## 2016-08-30 ENCOUNTER — Inpatient Hospital Stay: Payer: BLUE CROSS/BLUE SHIELD

## 2016-08-30 ENCOUNTER — Inpatient Hospital Stay: Payer: BLUE CROSS/BLUE SHIELD | Admitting: Internal Medicine

## 2016-08-30 DIAGNOSIS — C7801 Secondary malignant neoplasm of right lung: Secondary | ICD-10-CM | POA: Diagnosis not present

## 2016-08-31 ENCOUNTER — Ambulatory Visit: Payer: BLUE CROSS/BLUE SHIELD | Admitting: Internal Medicine

## 2016-08-31 ENCOUNTER — Other Ambulatory Visit: Payer: BLUE CROSS/BLUE SHIELD

## 2016-09-04 ENCOUNTER — Ambulatory Visit: Payer: BLUE CROSS/BLUE SHIELD

## 2016-09-06 ENCOUNTER — Ambulatory Visit
Admission: RE | Admit: 2016-09-06 | Discharge: 2016-09-06 | Disposition: A | Discharge status: Home / Self Care | Payer: BLUE CROSS/BLUE SHIELD | Source: Ambulatory Visit | Attending: Radiation Oncology | Admitting: Radiation Oncology

## 2016-09-06 DIAGNOSIS — C7801 Secondary malignant neoplasm of right lung: Secondary | ICD-10-CM | POA: Diagnosis not present

## 2016-09-08 IMAGING — DX DG FINGER MIDDLE 2+V*L*
3 series · 3 of 3 positions shown · non-contrast
Comparison: None.

CLINICAL DATA: Slammed left fingers inner door 2 days ago, pain in
middle finger

EXAM:
LEFT MIDDLE FINGER 2+V

[finger ap]
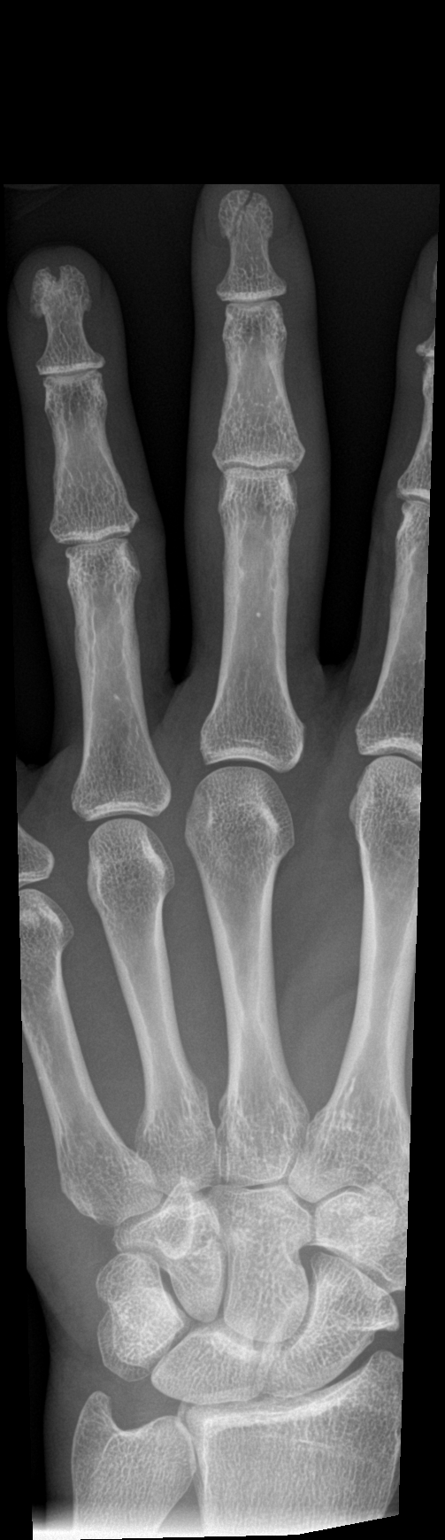

[finger obl]
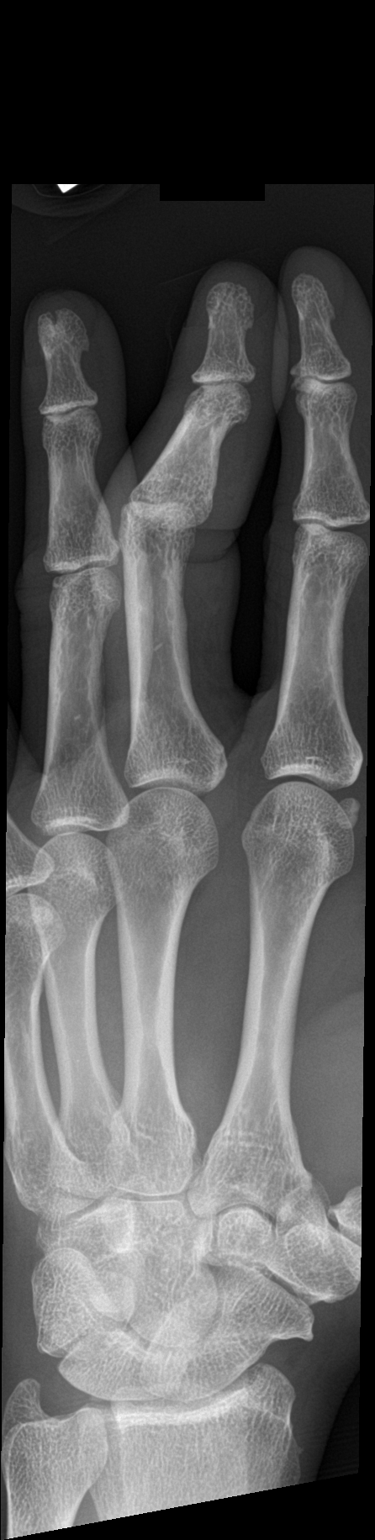

[finger lat]
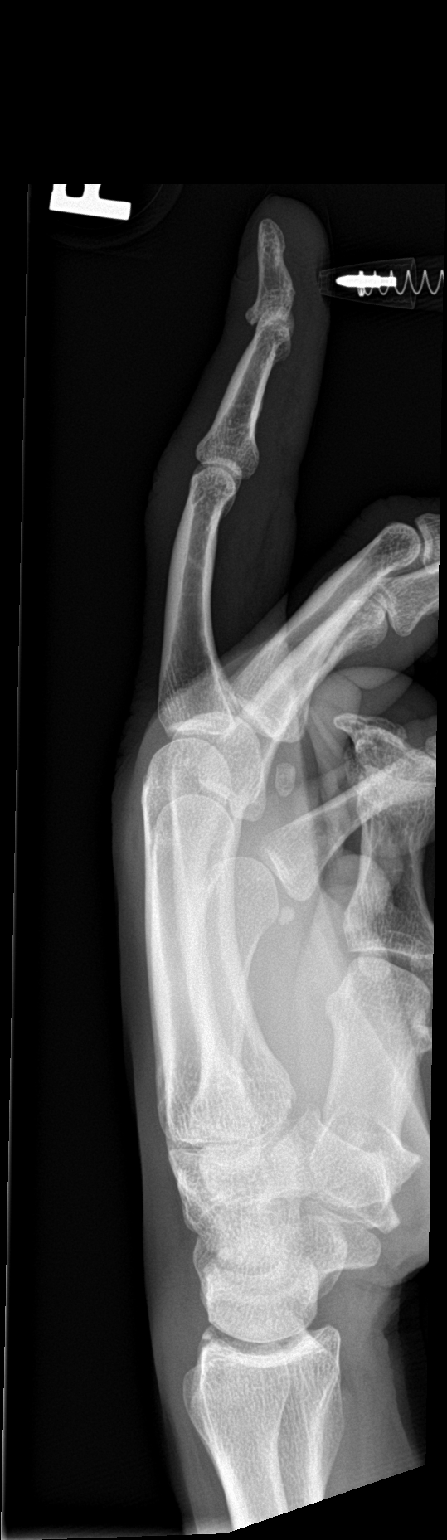

[3 of 3 positions shown; findings below may reference images not displayed]

FINDINGS: Oblique fracture third distal phalanx. Fracture involves the distal
tip of the phalanx with minimal displacement. There is also minimal
deformity of the dorsal base of the distal phalanx.

Defect in the tip of the fourth distal phalanx is also noted which
demonstrates a more chronic appearance.
IMPRESSION: Minimally displaced fracture third distal phalanx, presumably acute.

Correlate with symptoms for fourth finger. Defect distal phalanx may
reflect the sequela of a prior fracture unless there is currently
pain to suggest acute fracture of the fourth finger as well.

## 2016-09-11 ENCOUNTER — Ambulatory Visit
Admission: RE | Admit: 2016-09-11 | Discharge: 2016-09-11 | Disposition: A | Discharge status: Home / Self Care | Payer: BLUE CROSS/BLUE SHIELD | Source: Ambulatory Visit | Attending: Radiation Oncology | Admitting: Radiation Oncology

## 2016-09-11 DIAGNOSIS — C7801 Secondary malignant neoplasm of right lung: Secondary | ICD-10-CM | POA: Diagnosis not present

## 2016-09-13 ENCOUNTER — Ambulatory Visit
Admission: RE | Admit: 2016-09-13 | Discharge: 2016-09-13 | Disposition: A | Discharge status: Home / Self Care | Payer: BLUE CROSS/BLUE SHIELD | Source: Ambulatory Visit | Attending: Radiation Oncology | Admitting: Radiation Oncology

## 2016-09-13 DIAGNOSIS — C7801 Secondary malignant neoplasm of right lung: Secondary | ICD-10-CM | POA: Diagnosis not present

## 2016-09-18 ENCOUNTER — Ambulatory Visit
Admission: RE | Admit: 2016-09-18 | Discharge: 2016-09-18 | Disposition: A | Discharge status: Home / Self Care | Payer: BLUE CROSS/BLUE SHIELD | Source: Ambulatory Visit | Attending: Radiation Oncology | Admitting: Radiation Oncology

## 2016-09-18 DIAGNOSIS — C7801 Secondary malignant neoplasm of right lung: Secondary | ICD-10-CM | POA: Diagnosis not present

## 2016-09-20 ENCOUNTER — Ambulatory Visit
Admission: RE | Admit: 2016-09-20 | Discharge: 2016-09-20 | Disposition: A | Discharge status: Home / Self Care | Payer: BLUE CROSS/BLUE SHIELD | Source: Ambulatory Visit | Attending: Radiation Oncology | Admitting: Radiation Oncology

## 2016-09-20 DIAGNOSIS — C7801 Secondary malignant neoplasm of right lung: Secondary | ICD-10-CM | POA: Diagnosis not present

## 2016-10-18 ENCOUNTER — Inpatient Hospital Stay: Payer: BLUE CROSS/BLUE SHIELD | Attending: Internal Medicine | Admitting: Internal Medicine

## 2016-10-18 ENCOUNTER — Other Ambulatory Visit: Payer: Self-pay | Admitting: *Deleted

## 2016-10-18 ENCOUNTER — Inpatient Hospital Stay: Payer: BLUE CROSS/BLUE SHIELD

## 2016-10-18 VITALS — BP 128/68 | HR 82 | Temp 97.4°F | Wt 112.8 lb

## 2016-10-18 DIAGNOSIS — F17210 Nicotine dependence, cigarettes, uncomplicated: Secondary | ICD-10-CM | POA: Diagnosis not present

## 2016-10-18 DIAGNOSIS — Z79899 Other long term (current) drug therapy: Secondary | ICD-10-CM | POA: Diagnosis not present

## 2016-10-18 DIAGNOSIS — G8929 Other chronic pain: Secondary | ICD-10-CM | POA: Diagnosis not present

## 2016-10-18 DIAGNOSIS — R948 Abnormal results of function studies of other organs and systems: Secondary | ICD-10-CM

## 2016-10-18 DIAGNOSIS — Z9221 Personal history of antineoplastic chemotherapy: Secondary | ICD-10-CM

## 2016-10-18 DIAGNOSIS — Z923 Personal history of irradiation: Secondary | ICD-10-CM | POA: Diagnosis not present

## 2016-10-18 DIAGNOSIS — B1920 Unspecified viral hepatitis C without hepatic coma: Secondary | ICD-10-CM | POA: Diagnosis not present

## 2016-10-18 DIAGNOSIS — C3411 Malignant neoplasm of upper lobe, right bronchus or lung: Secondary | ICD-10-CM

## 2016-10-18 DIAGNOSIS — Z87442 Personal history of urinary calculi: Secondary | ICD-10-CM

## 2016-10-18 DIAGNOSIS — M25511 Pain in right shoulder: Secondary | ICD-10-CM | POA: Diagnosis not present

## 2016-10-18 LAB — COMPREHENSIVE METABOLIC PANEL
ALBUMIN: 3.6 g/dL (ref 3.5–5.0)
ALT: 13 U/L — ABNORMAL LOW (ref 17–63)
ANION GAP: 5 (ref 5–15)
AST: 23 U/L (ref 15–41)
Alkaline Phosphatase: 43 U/L (ref 38–126)
BILIRUBIN TOTAL: 1.4 mg/dL — AB (ref 0.3–1.2)
BUN: 14 mg/dL (ref 6–20)
CO2: 28 mmol/L (ref 22–32)
Calcium: 8.9 mg/dL (ref 8.9–10.3)
Chloride: 103 mmol/L (ref 101–111)
Creatinine, Ser: 1.09 mg/dL (ref 0.61–1.24)
GFR calc Af Amer: >60 mL/min (ref >60)
GFR calc non Af Amer: >60 mL/min (ref >60)
GLUCOSE: 94 mg/dL (ref 65–99)
POTASSIUM: 4.1 mmol/L (ref 3.5–5.1)
SODIUM: 136 mmol/L (ref 135–145)
TOTAL PROTEIN: 7.9 g/dL (ref 6.5–8.1)

## 2016-10-18 LAB — CBC WITH DIFFERENTIAL/PLATELET
BASOS ABS: 0 10*3/uL (ref 0–0.1)
BASOS PCT: 1 %
Eosinophils Absolute: 0.1 10*3/uL (ref 0–0.7)
Eosinophils Relative: 3 %
HEMATOCRIT: 38.7 % — AB (ref 40.0–52.0)
Hemoglobin: 13.2 g/dL (ref 13.0–18.0)
LYMPHS PCT: 42 %
Lymphs Abs: 1.7 10*3/uL (ref 1.0–3.6)
MCH: 29.8 pg (ref 26.0–34.0)
MCHC: 34.1 g/dL (ref 32.0–36.0)
MCV: 87.4 fL (ref 80.0–100.0)
Monocytes Absolute: 0.7 10*3/uL (ref 0.2–1.0)
Monocytes Relative: 17 %
NEUTROS ABS: 1.5 10*3/uL (ref 1.4–6.5)
NEUTROS PCT: 37 %
Platelets: 175 10*3/uL (ref 150–440)
RBC: 4.43 MIL/uL (ref 4.40–5.90)
RDW: 12.6 % (ref 11.5–14.5)
WBC: 4 10*3/uL (ref 3.8–10.6)

## 2016-10-18 NOTE — Progress Notes (Signed)
Patient reports today for a follow up. Patient has no new concerns today.

## 2016-10-18 NOTE — Progress Notes (Signed)
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   # April/May 2018- RUL- local Progression on CT/PET- referral to rad-Onc  # July 2017 Elevated LFTs- likely from Hepatitis C [resolved on treatment- July 2018]; less likely Tecentriq.  # Hep C [june 2018; treatment at [facility]]  # Chronic pain- on oxycodone.jan 2017-Bone scan- neg.       Cancer of upper lobe of right lung ([place])     INTERVAL HISTORY:  Vague historian.  56 year old male patient with above history of  Adenocarcinoma/Recurrent currently on  Status post SB RT  Local progression of right upper lobe lung nodule [ finished in 09/20/2016].    In the interim patient  Was referred to [facility]-  Currently on treatment for his hep C.   Denies any new onset or shortness of breath or cough or hemoptysis. Denies any nausea vomiting abdominal pain. He continues to have chronic right shoulder pain for which is on pain medications for pain management clinic. Denies any headaches. Or vision changes.  REVIEW OF SYSTEMS:  No headaches or vision changes. A complete 10 point review of system is done which is negative for mentioned above in history of present illness.  PAST MEDICAL HISTORY :  Past Medical History:  Diagnosis Date  . Arthritis   . Chicken pox   . Kidney stones   . Lung cancer ([place]) 2015    PAST  SURGICAL HISTORY :   Past Surgical History:  Procedure Laterality Date  . amputation of index finger  2010  . HAND SURGERY Right 2010   ORIF of right index finder, 2nd carpometacarpal joint dislocation right hand, proximal right humerus  . ORIF FEMUR FRACTURE Right 2010  . SHOULDER SURGERY Right 2010   pins and rods in shoulder    FAMILY HISTORY :  No family history on file.  SOCIAL HISTORY:   Social History  Substance Use Topics  . Smoking status: Current Every Day Smoker    Packs/day: 2.00    Years: 20.00    Types: Cigarettes  . Smokeless tobacco: Never Used  . Alcohol use 0.0 oz/week    ALLERGIES:  has No Known Allergies.  MEDICATIONS:  Current Outpatient Prescriptions  Medication Sig Dispense Refill  . Oxycodone HCl 10 MG TABS Limit one half to one tablet by mouth per day or twice per day if tolerated 60 tablet 0  . varenicline (CHANTIX STARTING MONTH PAK) 0.5 MG X 11 & 1 MG X 42 tablet Take one 0.5 mg tablet by mouth once daily for 3 days, then increase to one 0.5 mg tablet twice daily for 4 days, then increase to one 1 mg tablet twice daily. 53 tablet 0   Current Facility-Administered Medications  Medication Dose Route Frequency Provider Last Rate Last Dose  . bupivacaine (PF) (MARCAINE) 0.25 % injection 30 mL  30 mL Other  Once Mohammed Kindle, MD      . sodium chloride flush (NS) 0.9 % injection 20 mL  20 mL Other Once Mohammed Kindle, MD       Facility-Administered Medications Ordered in Other Visits  Medication Dose Route Frequency Provider Last Rate Last Dose  . sodium chloride flush (NS) 0.9 % injection 10 mL  10 mL Intracatheter PRN Cammie Sickle, MD   10 mL at 05/26/15 1300  . sodium chloride flush (NS) 0.9 % injection 10 mL  10 mL Intravenous PRN Cammie Sickle, MD   10 mL at 07/07/15 1425    PHYSICAL EXAMINATION: ECOG PERFORMANCE STATUS: 0 - Asymptomatic  BP 128/68 (BP Location: Left Arm)   Pulse 82   Temp (!) 97.4 F (36.3 C) (Tympanic)    Wt 112 lb 12.8 oz (51.2 kg)   SpO2 95%   BMI 16.66 kg/m   Filed Weights   10/18/16 1209  Weight: 112 lb 12.8 oz (51.2 kg)    GENERAL:  Thin built/ moderately nourished; Alert, no distress and comfortable. He is alone.  EYES: no pallor or icterus OROPHARYNX: no thrush or ulceration; poor dentition  NECK: supple, no masses felt LYMPH:  no palpable lymphadenopathy in the cervical, axillary or inguinal regions LUNGS:  Decreased breath sounds bilaterally to auscultation. No wheeze or crackles HEART/CVS: regular rate & rhythm and no murmurs; No lower extremity edema ABDOMEN:abdomen soft, non-tender and normal bowel sounds Musculoskeletal:no cyanosis of digits and no clubbing  PSYCH: alert & oriented x 3 with fluent speech NEURO: no focal motor/sensory deficits SKIN:  no rashes or significant lesions  LABORATORY DATA:  I have reviewed the data as listed    Component Value Date/Time   NA 136 10/18/2016 1145   NA 136 07/27/2014 0856   K 4.1 10/18/2016 1145   K 3.9 07/27/2014 0856   CL 103 10/18/2016 1145   CL 104 07/27/2014 0856   CO2 28 10/18/2016 1145   CO2 27 07/27/2014 0856   GLUCOSE 94 10/18/2016 1145   GLUCOSE 151 (H) 07/27/2014 0856   BUN 14 10/18/2016 1145   BUN 10 07/27/2014 0856   CREATININE 1.09 10/18/2016 1145   CREATININE 0.86 07/27/2014 0856   CREATININE 0.77 07/06/2014   CALCIUM 8.9 10/18/2016 1145   CALCIUM 8.6 (L) 07/27/2014 0856   PROT 7.9 10/18/2016 1145   PROT 8.8 (H) 06/28/2014 1512   ALBUMIN 3.6 10/18/2016 1145   ALBUMIN 4.0 06/28/2014 1512   AST 23 10/18/2016 1145   AST 37 06/28/2014 1512   ALT 13 (L) 10/18/2016 1145   ALT 43 06/28/2014 1512   ALKPHOS 43 10/18/2016 1145   ALKPHOS 53 06/28/2014 1512   BILITOT 1.4 (H) 10/18/2016 1145   BILITOT 0.3 06/28/2014 1512   GFRNONAA >60 10/18/2016 1145   GFRNONAA >60 07/27/2014 0856   GFRAA >60 10/18/2016 1145   GFRAA >60 07/27/2014 0856    No results found for: SPEP, UPEP  Lab Results  Component  Value Date   WBC 4.0 10/18/2016   NEUTROABS 1.5 10/18/2016   HGB 13.2 10/18/2016   HCT 38.7 (L) 10/18/2016   MCV 87.4 10/18/2016   PLT 175 10/18/2016      Chemistry      Component Value Date/Time   NA 136 10/18/2016 1145   NA 136 07/27/2014 0856   K 4.1 10/18/2016 1145   K 3.9 07/27/2014 0856   CL 103 10/18/2016 1145   CL 104 07/27/2014 0856   CO2 28 10/18/2016 1145  CO2 27 07/27/2014 0856   BUN 14 10/18/2016 1145   BUN 10 07/27/2014 0856   CREATININE 1.09 10/18/2016 1145   CREATININE 0.86 07/27/2014 0856   CREATININE 0.77 07/06/2014      Component Value Date/Time   CALCIUM 8.9 10/18/2016 1145   CALCIUM 8.6 (L) 07/27/2014 0856   ALKPHOS 43 10/18/2016 1145   ALKPHOS 53 06/28/2014 1512   AST 23 10/18/2016 1145   AST 37 06/28/2014 1512   ALT 13 (L) 10/18/2016 1145   ALT 43 06/28/2014 1512   BILITOT 1.4 (H) 10/18/2016 1145   BILITOT 0.3 06/28/2014 1512      ASSESSMENT & PLAN:   Cancer of upper lobe of right lung [facility]) Recurrent adenocarcinoma lung-s/p Ria Bush July 2017- held sec to elevated LFTs]- see discussion below.   #  RUL pleural based nodule/ RUL lesion ~17 mm. May 3rd PET- RUL concerning for RUL nodule progression;.  S/p SBRT [june 21st 2018]. Will repeat CT scan in appx  2 months from now [3 months from last RT]. This is ordered today.   #  LFT elevation/hepC- AST/ALT- 3-5 times the upper limit; resolved- likely from hep C- resolved; on treatment. Not from immunotherapy.Reviewed the notes from GI, [place].   # Chronic pain- unrelated to cancer follow up with pain clinic.  # follow up with me in 2 months/ labs/port; CT chest few days prior. /port flush.    Cammie Sickle, MD 10/19/2016 9:29 AM

## 2016-10-18 NOTE — Assessment & Plan Note (Addendum)
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   #  RUL pleural based nodule/ RUL lesion ~17 mm. May 3rd PET- RUL concerning for RUL nodule progression;.  S/p SBRT [june 21st 2018]. Will repeat CT scan in appx  2 months from now [3 months from last RT]. This is ordered today.   #  LFT elevation/hepC- AST/ALT- 3-5 times the upper limit; resolved- likely from hep C- resolved; on treatment. Not from immunotherapy.Reviewed the notes from GI, [place].   # Chronic pain- unrelated to cancer follow up with pain clinic.  # follow up with me in 2 months/ labs/port; CT chest few days prior. /port flush.

## 2016-10-22 ENCOUNTER — Ambulatory Visit
Admission: RE | Admit: 2016-10-22 | Discharge: 2016-10-22 | Disposition: A | Discharge status: Home / Self Care | Payer: BLUE CROSS/BLUE SHIELD | Source: Ambulatory Visit | Attending: Radiation Oncology | Admitting: Radiation Oncology

## 2016-10-22 ENCOUNTER — Inpatient Hospital Stay: Payer: BLUE CROSS/BLUE SHIELD

## 2016-10-22 ENCOUNTER — Encounter: Payer: Self-pay | Admitting: Radiation Oncology

## 2016-10-22 ENCOUNTER — Other Ambulatory Visit: Payer: Self-pay | Admitting: *Deleted

## 2016-10-22 VITALS — BP 118/73 | HR 70 | Temp 97.0°F | Wt 113.2 lb

## 2016-10-22 DIAGNOSIS — F17210 Nicotine dependence, cigarettes, uncomplicated: Secondary | ICD-10-CM | POA: Diagnosis not present

## 2016-10-22 DIAGNOSIS — Z923 Personal history of irradiation: Secondary | ICD-10-CM | POA: Insufficient documentation

## 2016-10-22 DIAGNOSIS — C3411 Malignant neoplasm of upper lobe, right bronchus or lung: Secondary | ICD-10-CM

## 2016-10-22 DIAGNOSIS — C7801 Secondary malignant neoplasm of right lung: Secondary | ICD-10-CM | POA: Insufficient documentation

## 2016-10-22 DIAGNOSIS — Z95828 Presence of other vascular implants and grafts: Secondary | ICD-10-CM

## 2016-10-22 MED ORDER — SODIUM CHLORIDE 0.9% FLUSH
10.0000 mL | INTRAVENOUS | Status: DC | PRN
  Administered 2016-10-22: 10 mL via INTRAVENOUS
  Filled 2016-10-22: qty 10

## 2016-10-22 MED ORDER — HEPARIN SOD (PORK) LOCK FLUSH 100 UNIT/ML IV SOLN
500.0000 [IU] | Freq: Once | INTRAVENOUS | Status: AC
  Administered 2016-10-22: 500 [IU] via INTRAVENOUS

## 2016-10-22 NOTE — Progress Notes (Signed)
Radiation Oncology Follow up Note  Name: Mario [name]   Date:   10/22/2016 MRN:  [id number] DOB: 04/29/1960    This 56 y.o. male presents to the clinic today for one-month follow-up status post SB RT to his right midlung field.  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 We decided on SB RT treatment and he is now 1 month out. He is doing well. He specifically denies cough hemoptysis or chest tightness. He's having no dysphagia..  COMPLICATIONS OF TREATMENT: none  FOLLOW UP COMPLIANCE: keeps appointments   PHYSICAL EXAM:  BP 118/73   Pulse 70   Temp (!) 97 F (36.1 C)   Wt 113 lb 3.3 oz (51.4 kg)   BMI 16.72 kg/m  Well-developed well-nourished patient in NAD. HEENT reveals PERLA, EOMI, discs not visualized.  Oral cavity is clear. No oral mucosal lesions are identified. Neck is clear without evidence of cervical or supraclavicular adenopathy. Lungs are clear to A&P. Cardiac examination is essentially unremarkable with regular rate and rhythm without murmur rub or thrill. Abdomen is benign with no organomegaly or masses noted. Motor sensory and DTR levels are equal and symmetric in the upper and lower extremities. Cranial nerves II through XII are grossly intact. Proprioception is intact. No peripheral adenopathy or edema is identified. No motor or sensory levels are noted. Crude visual fields are within normal range.  RADIOLOGY RESULTS: CT scan with contrast ordered in the next 3 months  PLAN: present time patient is doing well with very little side effects or complaints. He has tolerated strips extremely well. I've asked to see him back in 3-4 months and will have a CT scan with contrast of his chest performed 1 week prior to that visit. Patient is to  call with any concerns.  I would like to take this opportunity to thank you for allowing me to participate in the care of your patient.Armstead Peaks., MD

## 2016-10-29 ENCOUNTER — Ambulatory Visit
Admission: RE | Admit: 2016-10-29 | Discharge: 2016-10-29 | Disposition: A | Discharge status: Home / Self Care | Payer: BLUE CROSS/BLUE SHIELD | Source: Ambulatory Visit | Attending: Internal Medicine | Admitting: Internal Medicine

## 2016-10-29 DIAGNOSIS — R938 Abnormal findings on diagnostic imaging of other specified body structures: Secondary | ICD-10-CM | POA: Insufficient documentation

## 2016-10-29 DIAGNOSIS — J984 Other disorders of lung: Secondary | ICD-10-CM | POA: Diagnosis not present

## 2016-10-29 DIAGNOSIS — I700 Atherosclerosis of aorta: Secondary | ICD-10-CM | POA: Diagnosis not present

## 2016-10-29 DIAGNOSIS — J439 Emphysema, unspecified: Secondary | ICD-10-CM | POA: Diagnosis not present

## 2016-10-29 DIAGNOSIS — C3411 Malignant neoplasm of upper lobe, right bronchus or lung: Secondary | ICD-10-CM | POA: Diagnosis present

## 2016-10-29 MED ORDER — IOPAMIDOL (ISOVUE-300) INJECTION 61%
60.0000 mL | Freq: Once | INTRAVENOUS | Status: AC | PRN
  Administered 2016-10-29: 60 mL via INTRAVENOUS

## 2016-11-15 ENCOUNTER — Encounter: Payer: Self-pay | Admitting: Emergency Medicine

## 2016-11-15 ENCOUNTER — Emergency Department
Admission: EM | Admit: 2016-11-15 | Discharge: 2016-11-15 | Disposition: A | Discharge status: Home / Self Care | Payer: BLUE CROSS/BLUE SHIELD | Attending: Emergency Medicine | Admitting: Emergency Medicine

## 2016-11-15 DIAGNOSIS — S31119A Laceration without foreign body of abdominal wall, unspecified quadrant without penetration into peritoneal cavity, initial encounter: Secondary | ICD-10-CM | POA: Insufficient documentation

## 2016-11-15 DIAGNOSIS — W272XXA Contact with scissors, initial encounter: Secondary | ICD-10-CM | POA: Insufficient documentation

## 2016-11-15 DIAGNOSIS — Z23 Encounter for immunization: Secondary | ICD-10-CM | POA: Diagnosis not present

## 2016-11-15 DIAGNOSIS — Y939 Activity, unspecified: Secondary | ICD-10-CM | POA: Insufficient documentation

## 2016-11-15 DIAGNOSIS — F17210 Nicotine dependence, cigarettes, uncomplicated: Secondary | ICD-10-CM | POA: Insufficient documentation

## 2016-11-15 DIAGNOSIS — Y929 Unspecified place or not applicable: Secondary | ICD-10-CM | POA: Insufficient documentation

## 2016-11-15 DIAGNOSIS — Y990 Civilian activity done for income or pay: Secondary | ICD-10-CM | POA: Insufficient documentation

## 2016-11-15 DIAGNOSIS — S3633XA Laceration of stomach, initial encounter: Secondary | ICD-10-CM

## 2016-11-15 MED ORDER — TETANUS-DIPHTH-ACELL PERTUSSIS 5-2.5-18.5 LF-MCG/0.5 IM SUSP
0.5000 mL | Freq: Once | INTRAMUSCULAR | Status: AC
  Administered 2016-11-15: 0.5 mL via INTRAMUSCULAR
  Filled 2016-11-15: qty 0.5

## 2016-11-15 MED ORDER — LIDOCAINE HCL (PF) 1 % IJ SOLN
5.0000 mL | Freq: Once | INTRAMUSCULAR | Status: AC
  Administered 2016-11-15: 5 mL via INTRADERMAL
  Filled 2016-11-15: qty 5

## 2016-11-15 NOTE — ED Provider Notes (Signed)
[facility] Emergency Department Provider Note  ____________________________________________  Time seen: Approximately 4:28 PM  I have reviewed the triage vital signs and the nursing notes.   HISTORY  Chief Complaint Laceration    HPI Mario [name] is a 56 y.o. male that presents to the emergency department with laceration to abdomen. He accidentally stabbed himself with a scissors while at work.He is unsure of last tetanus shot. No additional injuries. No shortness breath, chest pain, nausea, vomiting, abdominal pain.   Past Medical History:  Diagnosis Date  . Arthritis   . Chicken pox   . Kidney stones   . Lung cancer [place]) 2015    Patient Active Problem List   Diagnosis Date Noted  . 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 DDD (degenerative disc disease), cervical 11/02/2015  . Fracture of lower extremity 09/19/2015  . Elevated LFTs 09/19/2015  . Chronic pain 09/19/2015  . Cancer of upper lobe of right lung ([place]) 09/19/2015  . Encounter for antineoplastic chemotherapy 09/19/2015  . Current tobacco use 05/26/2014  . CALCANEAL FRACTURE 01/21/2008  . ANKLE SPRAIN, RIGHT 01/21/2008    Past Surgical History:  Procedure Laterality Date  . amputation of index finger  2010  . HAND SURGERY Right 2010   ORIF of right index finder, 2nd carpometacarpal joint dislocation right hand, proximal right humerus  . ORIF FEMUR FRACTURE Right 2010  . SHOULDER SURGERY Right 2010   pins and rods in shoulder    Prior to Admission medications   Medication Sig Start Date End Date Taking? Authorizing Provider  Oxycodone HCl 10 MG TABS Limit one half to one tablet by mouth per day or twice per day if tolerated 11/09/15   Mohammed Kindle, MD  varenicline (CHANTIX STARTING MONTH PAK) 0.5 MG X 11 & 1 MG X 42 tablet Take one 0.5 mg tablet by mouth once daily for 3 days, then increase to one 0.5 mg tablet twice daily for 4 days, then increase to one 1  mg tablet twice daily. 07/19/16   Cammie Sickle, MD    Allergies Patient has no known allergies.  No family history on file.  Social History Social History  Substance Use Topics  . Smoking status: Current Every Day Smoker    Packs/day: 2.00    Years: 20.00    Types: Cigarettes  . Smokeless tobacco: Never Used  . Alcohol use 0.0 oz/week     Review of Systems  Constitutional: No fever/chills Cardiovascular: No chest pain. Respiratory: No SOB. Gastrointestinal: No nausea, no vomiting.  Skin: Negative for rash, ecchymosis. Positive for laceration. Neurological: Negative for numbness or tingling   ____________________________________________   PHYSICAL EXAM:  VITAL SIGNS: ED Triage Vitals  Enc Vitals Group     BP 11/15/16 1546 137/65     Pulse Rate 11/15/16 1546 79     Resp 11/15/16 1546 18     Temp 11/15/16 1546 98.4 F (36.9 C)     Temp Source 11/15/16 1546 Oral     SpO2 11/15/16 1546 98 %     Weight 11/15/16 1546 126 lb (57.2 kg)     Height 11/15/16 1546 5\' 9"  (1.753 m)     Head Circumference --      Peak Flow --      Pain Score 11/15/16 1545 7     Pain Loc --      Pain Edu? --      Excl. in [place]? --  Constitutional: Alert and oriented. Well appearing and in no acute distress. Eyes: Conjunctivae are normal. PERRL. EOMI. Head: Atraumatic. ENT:      Ears:      Nose: No congestion/rhinnorhea.      Mouth/Throat: Mucous membranes are moist.  Neck: No stridor.  Cardiovascular: Normal rate, regular rhythm.  Good peripheral circulation. Respiratory: Normal respiratory effort without tachypnea or retractions. Lungs CTAB. Good air entry to the bases with no decreased or absent breath sounds. Musculoskeletal: Full range of motion to all extremities. No gross deformities appreciated. Neurologic:  Normal speech and language. No gross focal neurologic deficits are appreciated.  Skin:  Skin is warm, dry. 1/4 cm shallow laceration to  abdomen.   ____________________________________________   LABS (all labs ordered are listed, but only abnormal results are displayed)  Labs Reviewed - No data to display ____________________________________________  EKG   ____________________________________________  RADIOLOGY  No results found.  ____________________________________________    PROCEDURES  Procedure(s) performed:    Procedures  LACERATION REPAIR Performed by: Laban Emperor  Consent: Verbal consent obtained.  Consent given by: patient  Prepped and Draped in normal sterile fashion  Wound explored: No foreign bodies   Laceration Location: abdomen  Laceration Length: 1/4 cm  Anesthesia: None  Local anesthetic: lidocaine 1% without epinephrine  Anesthetic total: 2 ml  Irrigation method: syringe  Amount of cleaning: 595ml normal saline  Skin closure: 4-0 nylon  Number of sutures: 2  Technique: Simple interrupted  Patient tolerance: Patient tolerated the procedure well with no immediate complications.  Medications  Tdap (BOOSTRIX) injection 0.5 mL (0.5 mLs Intramuscular Given 11/15/16 1641)  lidocaine (PF) (XYLOCAINE) 1 % injection 5 mL (5 mLs Intradermal Given 11/15/16 1641)     ____________________________________________   INITIAL IMPRESSION / ASSESSMENT AND PLAN / ED COURSE  Pertinent labs & imaging results that were available during my care of the patient were reviewed by me and considered in my medical decision making (see chart for details).  Review of the [place] CSRS was performed in accordance of the [place] prior to dispensing any controlled drugs.   Patient presented to the emergency department for evaluation of abdomen laceration. Vital signs and exam are reassuring. Laceration is shallow, and it was repaired with stitches. Tetanus shot was updated.  Patient is to follow up with PCP as directed. Patient is given ED precautions to return to the ED for any worsening or new  symptoms.     ____________________________________________  FINAL CLINICAL IMPRESSION(S) / ED DIAGNOSES  Final diagnoses:  Laceration of stomach, initial encounter      NEW MEDICATIONS STARTED DURING THIS VISIT:  Discharge Medication List as of 11/15/2016  5:06 PM          This chart was dictated using voice recognition software/Dragon. Despite best efforts to proofread, errors can occur which can change the meaning. Any change was purely unintentional.    Laban Emperor, PA-C 11/15/16 1815    Darel Hong, MD 11/16/16 [phone]

## 2016-11-15 NOTE — ED Notes (Signed)
See triage note  Small laceration noted to abd from scissors   No bleeding noted at present

## 2016-11-15 NOTE — ED Triage Notes (Signed)
Pt reports accidentally stabbed himself with scissors in the abdomen today while cutting tape. Small laceration noted to abdomen. No bleeding noted. Pt ambulatory to triage.

## 2016-11-26 ENCOUNTER — Emergency Department
Admission: EM | Admit: 2016-11-26 | Discharge: 2016-11-26 | Disposition: A | Discharge status: Home / Self Care | Payer: BLUE CROSS/BLUE SHIELD | Attending: Emergency Medicine | Admitting: Emergency Medicine

## 2016-11-26 DIAGNOSIS — F17210 Nicotine dependence, cigarettes, uncomplicated: Secondary | ICD-10-CM | POA: Insufficient documentation

## 2016-11-26 DIAGNOSIS — Z4802 Encounter for removal of sutures: Secondary | ICD-10-CM | POA: Insufficient documentation

## 2016-11-26 DIAGNOSIS — Z85110 Personal history of malignant carcinoid tumor of bronchus and lung: Secondary | ICD-10-CM | POA: Diagnosis not present

## 2016-11-26 NOTE — ED Triage Notes (Signed)
Pt reports to ED for 2 stitches in belly to be removed. States they have been there 10 days. Seen here for stitches placed.

## 2016-11-26 NOTE — ED Provider Notes (Signed)
[facility] Emergency Department Provider Note   ____________________________________________  Time seen: 1659  I have reviewed the triage vital signs and the nursing notes.   HISTORY  Chief Complaint Suture / Staple Removal   HPI Mario Mario [name] is a 56 y.o. male who presents to the emergency department for suture removal. Sutures were inserted here approximately 10 days ago. Patient denies complaints.   Past Medical History:  Diagnosis Date  . Arthritis   . Chicken pox   . Kidney stones   . Lung cancer [facility]) 2015    Patient Active Problem List   Diagnosis Date Noted  . 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 DDD (degenerative disc disease), cervical 11/02/2015  . Fracture of lower extremity 09/19/2015  . Elevated LFTs 09/19/2015  . Chronic pain 09/19/2015  . Cancer of upper lobe of right lung ([place]) 09/19/2015  . Encounter for antineoplastic chemotherapy 09/19/2015  . Current tobacco use 05/26/2014  . CALCANEAL FRACTURE 01/21/2008  . ANKLE SPRAIN, RIGHT 01/21/2008    Past Surgical History:  Procedure Laterality Date  . amputation of index finger  2010  . HAND SURGERY Right 2010   ORIF of right index finder, 2nd carpometacarpal joint dislocation right hand, proximal right humerus  . ORIF FEMUR FRACTURE Right 2010  . SHOULDER SURGERY Right 2010   pins and rods in shoulder    Current Outpatient Rx  . Order #: [id number] Class: Print  . Order #: [id number] Class: Print    Allergies Patient has no known allergies.  History reviewed. No pertinent family history.  Social History Social History  Substance Use Topics  . Smoking status: Current Every Day Smoker    Packs/day: 2.00    Years: 20.00    Types: Cigarettes  . Smokeless tobacco: Never Used  . Alcohol use 0.0 oz/week    Review of Systems  Constitutional: Denies fever.  HEENT: No change from baseline Respiratory: No cough or shortness of  breath Musculoskeletal: No pain. Skin: healing wound; pain gradually resolving.  ____________________________________________   PHYSICAL EXAM:  VITAL SIGNS: ED Triage Vitals [11/26/16 1650]  Enc Vitals Group     BP 123/65     Pulse Rate 65     Resp 16     Temp 97.8 F (36.6 C)     Temp Source Oral     SpO2 99 %     Weight      Height      Head Circumference      Peak Flow      Pain Score 0     Pain Loc      Pain Edu?      Excl. in [place]?      Constitutional: Appears well. No distress HEENT: Atraumtaic, normal appearance, EOMI, sclera normal, voice normal. Respiratory: Respirations even and unlabored.  Cardiovascular: Capillary refill normal. Peripheral pulses 2+ Musculoskeletal: Mario [name] ROM x 4. Skin: 2 sutures in the right abdomen. Skin appears well healed. No evidence of infection or cellulitis. Neurovascular: Gait steady; Alert and oriented x 4.   PROCEDURES  Procedure(s) performed: SUTURE REMOVAL Performed by:   Consent: Verbal consent obtained. Patient identity confirmed: provided demographic data Time out: Immediately prior to procedure a "time out" was called to verify the correct patient, procedure, equipment, support staff and site/side marked as required.  Location details: Right abdomen  Wound Appearance: clean  Sutures/Staples Removed: 2 sutures were removed by RN.   Facility: sutures placed in this facility Patient tolerance: Patient tolerated  the procedure well with no immediate complications.    ____________________________________________   INITIAL IMPRESSION / ASSESSMENT AND PLAN / ED COURSE  Pertinent labs & imaging results that were available during my care of the patient were reviewed by me and considered in my medical decision making (see chart for details).  Wound care discussed. Patient advised to keep covered with sunscreen. Patient was advised to return to the ER for symptoms that change or worsen if unable to schedule an  appointment with primary care.  ____________________________________________   FINAL CLINICAL IMPRESSION(S) / ED DIAGNOSES  Final diagnoses:  Encounter for removal of sutures      Victorino Dike, FNP 11/26/16 [place], [place], MD 11/28/16 1406

## 2016-11-26 NOTE — ED Notes (Signed)
2 stitches removed by this RN. Pt tolerated well.

## 2016-12-20 ENCOUNTER — Inpatient Hospital Stay: Payer: BLUE CROSS/BLUE SHIELD | Admitting: Internal Medicine

## 2016-12-20 ENCOUNTER — Inpatient Hospital Stay: Payer: BLUE CROSS/BLUE SHIELD

## 2016-12-28 ENCOUNTER — Inpatient Hospital Stay: Payer: BLUE CROSS/BLUE SHIELD | Attending: Internal Medicine

## 2016-12-28 ENCOUNTER — Inpatient Hospital Stay (HOSPITAL_BASED_OUTPATIENT_CLINIC_OR_DEPARTMENT_OTHER): Payer: BLUE CROSS/BLUE SHIELD | Admitting: Internal Medicine

## 2016-12-28 VITALS — BP 122/74 | HR 77 | Temp 97.5°F | Resp 18 | Ht 69.0 in | Wt 117.0 lb

## 2016-12-28 DIAGNOSIS — Z8619 Personal history of other infectious and parasitic diseases: Secondary | ICD-10-CM | POA: Insufficient documentation

## 2016-12-28 DIAGNOSIS — F17210 Nicotine dependence, cigarettes, uncomplicated: Secondary | ICD-10-CM

## 2016-12-28 DIAGNOSIS — M129 Arthropathy, unspecified: Secondary | ICD-10-CM | POA: Diagnosis not present

## 2016-12-28 DIAGNOSIS — Z87442 Personal history of urinary calculi: Secondary | ICD-10-CM

## 2016-12-28 DIAGNOSIS — B1920 Unspecified viral hepatitis C without hepatic coma: Secondary | ICD-10-CM

## 2016-12-28 DIAGNOSIS — R948 Abnormal results of function studies of other organs and systems: Secondary | ICD-10-CM | POA: Diagnosis not present

## 2016-12-28 DIAGNOSIS — G8929 Other chronic pain: Secondary | ICD-10-CM | POA: Diagnosis not present

## 2016-12-28 DIAGNOSIS — C3411 Malignant neoplasm of upper lobe, right bronchus or lung: Secondary | ICD-10-CM | POA: Diagnosis present

## 2016-12-28 DIAGNOSIS — Z79899 Other long term (current) drug therapy: Secondary | ICD-10-CM | POA: Diagnosis not present

## 2016-12-28 DIAGNOSIS — Z452 Encounter for adjustment and management of vascular access device: Secondary | ICD-10-CM | POA: Diagnosis not present

## 2016-12-28 DIAGNOSIS — M25511 Pain in right shoulder: Secondary | ICD-10-CM

## 2016-12-28 DIAGNOSIS — Z9221 Personal history of antineoplastic chemotherapy: Secondary | ICD-10-CM | POA: Diagnosis not present

## 2016-12-28 DIAGNOSIS — R918 Other nonspecific abnormal finding of lung field: Secondary | ICD-10-CM | POA: Diagnosis not present

## 2016-12-28 DIAGNOSIS — R05 Cough: Secondary | ICD-10-CM | POA: Insufficient documentation

## 2016-12-28 DIAGNOSIS — R0602 Shortness of breath: Secondary | ICD-10-CM | POA: Insufficient documentation

## 2016-12-28 DIAGNOSIS — Z95828 Presence of other vascular implants and grafts: Secondary | ICD-10-CM

## 2016-12-28 LAB — COMPREHENSIVE METABOLIC PANEL
ALK PHOS: 36 U/L — AB (ref 38–126)
ALT: 15 U/L — AB (ref 17–63)
ANION GAP: 7 (ref 5–15)
AST: 29 U/L (ref 15–41)
Albumin: 3.5 g/dL (ref 3.5–5.0)
BILIRUBIN TOTAL: 0.8 mg/dL (ref 0.3–1.2)
BUN: 10 mg/dL (ref 6–20)
CALCIUM: 8.8 mg/dL — AB (ref 8.9–10.3)
CO2: 27 mmol/L (ref 22–32)
CREATININE: 0.87 mg/dL (ref 0.61–1.24)
Chloride: 103 mmol/L (ref 101–111)
Glucose, Bld: 134 mg/dL — ABNORMAL HIGH (ref 65–99)
Potassium: 4.1 mmol/L (ref 3.5–5.1)
SODIUM: 137 mmol/L (ref 135–145)
TOTAL PROTEIN: 7.3 g/dL (ref 6.5–8.1)

## 2016-12-28 LAB — CBC WITH DIFFERENTIAL/PLATELET
Basophils Absolute: 0 10*3/uL (ref 0–0.1)
Basophils Relative: 1 %
Eosinophils Absolute: 0.1 10*3/uL (ref 0–0.7)
Eosinophils Relative: 2 %
HEMATOCRIT: 35.8 % — AB (ref 40.0–52.0)
HEMOGLOBIN: 12.2 g/dL — AB (ref 13.0–18.0)
LYMPHS ABS: 1.6 10*3/uL (ref 1.0–3.6)
LYMPHS PCT: 32 %
MCH: 30.1 pg (ref 26.0–34.0)
MCHC: 34 g/dL (ref 32.0–36.0)
MCV: 88.5 fL (ref 80.0–100.0)
MONOS PCT: 12 %
Monocytes Absolute: 0.6 10*3/uL (ref 0.2–1.0)
NEUTROS ABS: 2.6 10*3/uL (ref 1.4–6.5)
NEUTROS PCT: 53 %
Platelets: 170 10*3/uL (ref 150–440)
RBC: 4.04 MIL/uL — AB (ref 4.40–5.90)
RDW: 12.9 % (ref 11.5–14.5)
WBC: 4.9 10*3/uL (ref 3.8–10.6)

## 2016-12-28 MED ORDER — SODIUM CHLORIDE 0.9% FLUSH
10.0000 mL | INTRAVENOUS | Status: AC | PRN
  Administered 2016-12-28: 10 mL
  Filled 2016-12-28: qty 10

## 2016-12-28 MED ORDER — HEPARIN SOD (PORK) LOCK FLUSH 100 UNIT/ML IV SOLN
500.0000 [IU] | INTRAVENOUS | Status: AC | PRN
  Administered 2016-12-28: 500 [IU]

## 2016-12-28 NOTE — Progress Notes (Signed)
.[facility] OFFICE PROGRESS NOTE  Patient Care Team: Tracie Harrier, MD as PCP - General (Internal Medicine) Mohammed Kindle, MD as Attending Physician (Pain Medicine)   SUMMARY OF ONCOLOGIC HISTORY:  Oncology History   # MARCH 2016- LUNG CA- RUL- ADENO CA [mol testing-NA sec to poor samples] STAGE III s/p chemo-RT; [SEP 2016-CT Bx for mol testing; neg for malignancy]; CT-29th  NOV 2016-  Stable/ improved right upper lobe lesion;   # RECURRENT- CT JAN 2017- Stable right upper lobe lung nodule ~ 2.5cm; but progressive RML ~1.8cm; Start Tecentriq q 3W x3 ; March 30th CT 2017- Improved Lung Lesions; RML- 1.6x1.0; RUL ~3cm.; AUG 2nd 2017-CT stable.   # April/May 2018- RUL- local Progression on CT/PET- referral to rad-Onc  # July 2017 Elevated LFTs- likely from Hepatitis C [resolved on treatment- July 2018]; less likely Tecentriq.  # Hep C [june 2018; treatment at [facility]]  # Chronic pain- on oxycodone.jan 2017-Bone scan- neg.       Cancer of upper lobe of right lung ([place])     INTERVAL HISTORY:  Vague historian.  56 year old male patient with above history of  Adenocarcinoma/Recurrent currently on  Status post SB RT  Local progression of right upper lobe lung nodule [ finished in 09/20/2016]; is here to review the results of the CT scan.  Patient has chronic shortness of breath. However this is not getting any worse. Chronic cough no hemoptysis. Denies any nausea vomiting abdominal pain.   He continues to have chronic right shoulder pain for which is on pain medications for pain management clinic. Denies any headaches Or vision changes.  REVIEW OF SYSTEMS:  No headaches or vision changes. A complete 10 point review of system is done which is negative for mentioned above in history of present illness.  PAST MEDICAL HISTORY :  Past Medical History:  Diagnosis Date  . Arthritis   . Chicken pox   . Kidney stones   . Lung cancer ([place]) 2015    PAST SURGICAL  HISTORY :   Past Surgical History:  Procedure Laterality Date  . amputation of index finger  2010  . HAND SURGERY Right 2010   ORIF of right index finder, 2nd carpometacarpal joint dislocation right hand, proximal right humerus  . ORIF FEMUR FRACTURE Right 2010  . SHOULDER SURGERY Right 2010   pins and rods in shoulder    FAMILY HISTORY :  No family history on file.  SOCIAL HISTORY:   Social History  Substance Use Topics  . Smoking status: Current Every Day Smoker    Packs/day: 2.00    Years: 20.00    Types: Cigarettes  . Smokeless tobacco: Never Used  . Alcohol use 0.0 oz/week    ALLERGIES:  has No Known Allergies.  MEDICATIONS:  Current Outpatient Prescriptions  Medication Sig Dispense Refill  . Oxycodone HCl 10 MG TABS Limit one half to one tablet by mouth per day or twice per day if tolerated 60 tablet 0  . varenicline (CHANTIX STARTING MONTH PAK) 0.5 MG X 11 & 1 MG X 42 tablet Take one 0.5 mg tablet by mouth once daily for 3 days, then increase to one 0.5 mg tablet twice daily for 4 days, then increase to one 1 mg tablet twice daily. 53 tablet 0   Current Facility-Administered Medications  Medication Dose Route Frequency Provider Last Rate Last Dose  . bupivacaine (PF) (MARCAINE) 0.25 % injection 30 mL  30 mL Other Once Mohammed Kindle, MD      .  sodium chloride flush (NS) 0.9 % injection 20 mL  20 mL Other Once Mohammed Kindle, MD       Facility-Administered Medications Ordered in Other Visits  Medication Dose Route Frequency Provider Last Rate Last Dose  . sodium chloride flush (NS) 0.9 % injection 10 mL  10 mL Intracatheter PRN Cammie Sickle, MD   10 mL at 05/26/15 1300  . sodium chloride flush (NS) 0.9 % injection 10 mL  10 mL Intravenous PRN Cammie Sickle, MD   10 mL at 07/07/15 1425    PHYSICAL EXAMINATION: ECOG PERFORMANCE STATUS: 0 - Asymptomatic  BP 122/74 (Patient Position: Sitting)   Pulse 77   Temp (!) 97.5 F (36.4 C) (Tympanic)   Resp  18   Ht 5\' 9"  (1.753 m)   Wt 117 lb (53.1 kg)   BMI 17.28 kg/m   Filed Weights   12/28/16 1436  Weight: 117 lb (53.1 kg)    GENERAL:  Thin built/ moderately nourished; Alert, no distress and comfortable. He is alone.  EYES: no pallor or icterus OROPHARYNX: no thrush or ulceration; poor dentition  NECK: supple, no masses felt LYMPH:  no palpable lymphadenopathy in the cervical, axillary or inguinal regions LUNGS:  Decreased breath sounds bilaterally to auscultation. No wheeze or crackles HEART/CVS: regular rate & rhythm and no murmurs; No lower extremity edema ABDOMEN:abdomen soft, non-tender and normal bowel sounds Musculoskeletal:no cyanosis of digits and no clubbing  PSYCH: alert & oriented x 3 with fluent speech NEURO: no focal motor/sensory deficits SKIN:  no rashes or significant lesions  LABORATORY DATA:  I have reviewed the data as listed    Component Value Date/Time   NA 137 12/28/2016 1424   NA 136 07/27/2014 0856   K 4.1 12/28/2016 1424   K 3.9 07/27/2014 0856   CL 103 12/28/2016 1424   CL 104 07/27/2014 0856   CO2 27 12/28/2016 1424   CO2 27 07/27/2014 0856   GLUCOSE 134 (H) 12/28/2016 1424   GLUCOSE 151 (H) 07/27/2014 0856   BUN 10 12/28/2016 1424   BUN 10 07/27/2014 0856   CREATININE 0.87 12/28/2016 1424   CREATININE 0.86 07/27/2014 0856   CREATININE 0.77 07/06/2014   CALCIUM 8.8 (L) 12/28/2016 1424   CALCIUM 8.6 (L) 07/27/2014 0856   PROT 7.3 12/28/2016 1424   PROT 8.8 (H) 06/28/2014 1512   ALBUMIN 3.5 12/28/2016 1424   ALBUMIN 4.0 06/28/2014 1512   AST 29 12/28/2016 1424   AST 37 06/28/2014 1512   ALT 15 (L) 12/28/2016 1424   ALT 43 06/28/2014 1512   ALKPHOS 36 (L) 12/28/2016 1424   ALKPHOS 53 06/28/2014 1512   BILITOT 0.8 12/28/2016 1424   BILITOT 0.3 06/28/2014 1512   GFRNONAA >60 12/28/2016 1424   GFRNONAA >60 07/27/2014 0856   GFRAA >60 12/28/2016 1424   GFRAA >60 07/27/2014 0856    No results found for: SPEP, UPEP  Lab Results   Component Value Date   WBC 4.9 12/28/2016   NEUTROABS 2.6 12/28/2016   HGB 12.2 (L) 12/28/2016   HCT 35.8 (L) 12/28/2016   MCV 88.5 12/28/2016   PLT 170 12/28/2016      Chemistry      Component Value Date/Time   NA 137 12/28/2016 1424   NA 136 07/27/2014 0856   K 4.1 12/28/2016 1424   K 3.9 07/27/2014 0856   CL 103 12/28/2016 1424   CL 104 07/27/2014 0856   CO2 27 12/28/2016 1424   CO2 27 07/27/2014  0856   BUN 10 12/28/2016 1424   BUN 10 07/27/2014 0856   CREATININE 0.87 12/28/2016 1424   CREATININE 0.86 07/27/2014 0856   CREATININE 0.77 07/06/2014      Component Value Date/Time   CALCIUM 8.8 (L) 12/28/2016 1424   CALCIUM 8.6 (L) 07/27/2014 0856   ALKPHOS 36 (L) 12/28/2016 1424   ALKPHOS 53 06/28/2014 1512   AST 29 12/28/2016 1424   AST 37 06/28/2014 1512   ALT 15 (L) 12/28/2016 1424   ALT 43 06/28/2014 1512   BILITOT 0.8 12/28/2016 1424   BILITOT 0.3 06/28/2014 1512      ASSESSMENT & PLAN:   Cancer of upper lobe of right lung [facility]) Recurrent adenocarcinoma lung-s/p Ria Bush July 2017- held sec to elevated LFTs]- see discussion below.   #  RUL pleural based nodule/ RUL lesion ~17 mm. S/p SBRT [june 21st 2018]; CT scan 10/29/2016 shows stable lung nodule; no new lung lesions. I would recommend repeating the CT scan in approximately 4 months.   #  LFT elevation/hepC- AST/ALT- 3-5 times the upper limit; from hep C- resolved; on treatment. Not from immunotherapy.   # Chronic pain- unrelated to cancer follow up with pain clinic.  # Port explantation- discussed re: high risk of recurrence; in the need to use port for infusions. However patient  interested in taking it out. Make referal to Dr.Oaks.   # follow up with me in 2 months/port flush; repeat CT scan in 4 months. We'll check with the radiation oncology if the CT scan in November could be canceled.    Cammie Sickle, MD 12/28/2016 4:51 PM

## 2016-12-28 NOTE — Assessment & Plan Note (Addendum)
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   #  RUL pleural based nodule/ RUL lesion ~17 mm. S/p SBRT [june 21st 2018]; CT scan 10/29/2016 shows stable lung nodule; no new lung lesions. I would recommend repeating the CT scan in approximately 4 months.   #  LFT elevation/hepC- AST/ALT- 3-5 times the upper limit; from hep C- resolved; on treatment. Not from immunotherapy.   # Chronic pain- unrelated to cancer follow up with pain clinic.  # Port explantation- discussed re: high risk of recurrence; in the need to use port for infusions. However patient  interested in taking it out. Make referal to Dr.Oaks.   # follow up with me in 2 months/port flush; repeat CT scan in 4 months. We'll check with the radiation oncology if the CT scan in November could be canceled.

## 2016-12-28 NOTE — Progress Notes (Signed)
Patient here for lung cancer follow-up. Patient reports that he was bit by a tick approximately 3 weeks ago, but he was able to remove the tick without difficulty.  Pt reports cough/cold-clear mucus production.

## 2017-01-01 ENCOUNTER — Other Ambulatory Visit: Payer: Self-pay

## 2017-01-02 ENCOUNTER — Other Ambulatory Visit: Payer: Self-pay | Admitting: Internal Medicine

## 2017-01-02 ENCOUNTER — Telehealth: Payer: Self-pay | Admitting: Internal Medicine

## 2017-01-02 DIAGNOSIS — C3411 Malignant neoplasm of upper lobe, right bronchus or lung: Secondary | ICD-10-CM

## 2017-01-02 NOTE — Telephone Encounter (Signed)
Inbasket from [facility] to call pt with appt details for Dr. Genevive Bi on Oct 8th, 2018 @ 11:00 am. No answr, but message left on VM with appt details.

## 2017-01-07 ENCOUNTER — Ambulatory Visit (INDEPENDENT_AMBULATORY_CARE_PROVIDER_SITE_OTHER): Payer: BLUE CROSS/BLUE SHIELD | Admitting: Cardiothoracic Surgery

## 2017-01-07 ENCOUNTER — Encounter: Payer: Self-pay | Admitting: Cardiothoracic Surgery

## 2017-01-07 VITALS — BP 103/64 | HR 69 | Temp 98.2°F | Resp 16 | Ht 69.0 in | Wt 115.6 lb

## 2017-01-07 DIAGNOSIS — Z9221 Personal history of antineoplastic chemotherapy: Secondary | ICD-10-CM | POA: Diagnosis not present

## 2017-01-07 NOTE — Progress Notes (Signed)
  Patient ID: Mario [name], male   DOB: 13-Jun-1960, 56 y.o.   MRN: [id number]  HISTORY: This patient returns today in follow-up. Previously had a right upper lobe mass which is treated with a combination of radiation therapy and chemotherapy. He has now completed his therapy and desires to have his Port-A-Cath removed. He is a very thin gentleman with a little subcutaneous tissue. He did have 1 suture erode through the skin and there is a second suture that appears to be about to do the same. He finds this somewhat uncomfortable and wishes the catheter to be removed.   Vitals:   01/07/17 1048  BP: 103/64  Pulse: 69  Resp: 16  Temp: 98.2 F (36.8 C)  SpO2: 98%     EXAM:    Resp: Lungs are clear bilaterally.  No respiratory distress, normal effort. Heart:  Regular without murmurs Abd:  Abdomen is soft, non distended and non tender. No masses are palpable.  There is no rebound and no guarding.  Neurological: Alert and oriented to person, place, and time. Coordination normal.  Skin: Skin is warm and dry. No rash noted. No diaphoretic. No erythema. No pallor.  Psychiatric: Normal mood and affect. Normal behavior. Judgment and thought content normal.    ASSESSMENT: I have independently reviewed his last CT scan. This is from several months ago but shows the catheter to be in the superior vena cava. There is no complicating features to suggest any problems with removal.   PLAN:   I explained to the patient the indications and risks of Port-A-Cath removal. Will check a CBC and a metabolic panel today. We will remove his catheter next week. I explained to him that he will need to come back once the catheters removed for suture removal as well. He is in agreement.    Nestor Lewandowsky, MD

## 2017-01-07 NOTE — Addendum Note (Signed)
Addended by: Nestor Lewandowsky E on: 01/07/2017 11:54 AM   Modules accepted: Orders, SmartSet

## 2017-01-07 NOTE — Patient Instructions (Signed)
We have ordered some labs to be drawn today. Please proceed to the [facility] to have these tests completed prior to leaving today. You will check in at the registration desk in the medical mall. Please see walking directions below if needed.  We will call you with the results and next step in plan of care as soon as results are received.   Directions to Medical Mall: When leaving our office, go right. Go all of the way down to the very end of the hallway. You will have a purple wall in front of you. You will now have a tunnel to the hospital on your left hand side. Go through this tunnel and the elevators will be on your left. Go down to the 1st floor and take a slight left. The very first desk on the right hand side is the registration desk.   We have spoken today about removing your Port. This has been scheduled for 01/15/17 with Dr. Genevive Bi at [facility].  Please Refer to the [facility]) Pre-Care Sheet for further information.  Call our office with any questions or concerns that you may have.     Implanted Port Removal Implanted port removal is a procedure to remove the port and catheter (port-a-cath) that is implanted under your skin. The port is a small disc under your skin that can be punctured with a needle. It is connected to a vein in your chest or neck by a small flexible tube (catheter). The port-a-cath is used for treatment through an IV tube and for taking blood samples. Your health care provider will remove the port-a-cath if:  You no longer need it for treatment.  It is not working properly.  The area around it gets infected.  Tell a health care provider about:  Any allergies you have.  All medicines you are taking, including vitamins, herbs, eye drops, creams, and over-the-counter medicines.  Any problems you or family members have had with anesthetic medicines.  Any blood disorders you have.  Any surgeries you have had.  Any medical conditions you have.  Whether you are  pregnant or may be pregnant. What are the risks? Generally, this is a safe procedure. However, problems may occur, including:  Infection.  Bleeding.  Allergic reactions to anesthetic medicines.  Damage to nerves or blood vessels.  What happens before the procedure?  You will have: ? A physical exam. ? Blood tests. ? Imaging tests, including a chest X-ray.  Follow instructions from your health care provider about eating or drinking restrictions.  Ask your health care provider about: ? Changing or stopping your regular medicines. This is especially important if you are taking diabetes medicines or blood thinners. ? Taking medicines such as aspirin and ibuprofen. These medicines can thin your blood. Do not take these medicines before your procedure if your surgeon instructs you not to.  Ask your health care provider how your surgical site will be marked or identified.  You may be given antibiotic medicine to help prevent infection.  Plan to have someone take you home after the procedure.  If you will be going home right after the procedure, plan to have someone stay with you for 24 hours. What happens during the procedure?  To reduce your risk of infection: ? Your health care team will wash or sanitize their hands. ? Your skin will be washed with soap.  You may be given one or more of the following: ? A medicine to help you relax (sedative). ? A  medicine to numb the area (local anesthetic).  A small cut (incision) will be made at the site of your port-a-cath.  The port-a-cath and the catheter that has been inside your vein will gently be removed.  The incision will be closed with stitches (sutures), adhesive strips, or skin glue.  A bandage (dressing) will be placed over the incision. The procedure may vary among health care providers and hospitals. What happens after the procedure?  Your blood pressure, heart rate, breathing rate, and blood oxygen level will be  monitored often until the medicines you were given have worn off.  Do not drive for 24 hours if you received a sedative. This information is not intended to replace advice given to you by your health care provider. Make sure you discuss any questions you have with your health care provider. Document Released: 02/28/2015 Document Revised: 08/25/2015 Document Reviewed: 12/22/2014 Elsevier Interactive Patient Education  [employer].

## 2017-01-09 ENCOUNTER — Telehealth: Payer: Self-pay | Admitting: Cardiothoracic Surgery

## 2017-01-09 NOTE — Telephone Encounter (Signed)
I have called patient to go over surgery information. No answer.   Please advise patient of pre op date/time and sx date. Sx: 01/15/17 with Dr Rolley Sims removal.  Pre op: 01/14/17 between 1-9:00pm--phone.   Patient made aware to call [phone], between 1-3:00pm the day before surgery, to find out what time to arrive.

## 2017-01-11 NOTE — Telephone Encounter (Signed)
I have spoke with patient's wife and all surgery information has been discussed.

## 2017-01-14 ENCOUNTER — Encounter
Admission: RE | Admit: 2017-01-14 | Discharge: 2017-01-14 | Disposition: A | Discharge status: Home / Self Care | Payer: BLUE CROSS/BLUE SHIELD | Source: Ambulatory Visit | Attending: Cardiothoracic Surgery | Admitting: Cardiothoracic Surgery

## 2017-01-14 DIAGNOSIS — Z452 Encounter for adjustment and management of vascular access device: Secondary | ICD-10-CM | POA: Diagnosis not present

## 2017-01-14 DIAGNOSIS — F17200 Nicotine dependence, unspecified, uncomplicated: Secondary | ICD-10-CM | POA: Diagnosis not present

## 2017-01-14 DIAGNOSIS — J449 Chronic obstructive pulmonary disease, unspecified: Secondary | ICD-10-CM | POA: Diagnosis not present

## 2017-01-14 LAB — COMPREHENSIVE METABOLIC PANEL
ALT: 14 U/L — AB (ref 17–63)
AST: 31 U/L (ref 15–41)
Albumin: 3.9 g/dL (ref 3.5–5.0)
Alkaline Phosphatase: 36 U/L — ABNORMAL LOW (ref 38–126)
Anion gap: 11 (ref 5–15)
BILIRUBIN TOTAL: 1 mg/dL (ref 0.3–1.2)
BUN: 17 mg/dL (ref 6–20)
CO2: 27 mmol/L (ref 22–32)
CREATININE: 0.97 mg/dL (ref 0.61–1.24)
Calcium: 8.8 mg/dL — ABNORMAL LOW (ref 8.9–10.3)
Chloride: 102 mmol/L (ref 101–111)
Glucose, Bld: 146 mg/dL — ABNORMAL HIGH (ref 65–99)
POTASSIUM: 4.3 mmol/L (ref 3.5–5.1)
Sodium: 140 mmol/L (ref 135–145)
TOTAL PROTEIN: 7.9 g/dL (ref 6.5–8.1)

## 2017-01-14 LAB — CBC
HEMATOCRIT: 40.6 % (ref 40.0–52.0)
Hemoglobin: 13.4 g/dL (ref 13.0–18.0)
MCH: 29.6 pg (ref 26.0–34.0)
MCHC: 33.1 g/dL (ref 32.0–36.0)
MCV: 89.3 fL (ref 80.0–100.0)
Platelets: 179 10*3/uL (ref 150–440)
RBC: 4.54 MIL/uL (ref 4.40–5.90)
RDW: 13 % (ref 11.5–14.5)
WBC: 4.2 10*3/uL (ref 3.8–10.6)

## 2017-01-14 MED ORDER — CEFAZOLIN SODIUM-DEXTROSE 2-4 GM/100ML-% IV SOLN
2.0000 g | INTRAVENOUS | Status: AC
  Administered 2017-01-15: 2 g via INTRAVENOUS

## 2017-01-14 NOTE — Patient Instructions (Signed)
Your procedure is scheduled on: January 14, 2017 Report to Same Day Surgery on the 2nd floor in the [facility]. To find out your arrival time, please call [phone] between 1PM - 3PM on:  REMEMBER: Instructions that are not followed completely may result in serious medical risk up to and including death; or upon the discretion of your surgeon and anesthesiologist your surgery may need to be rescheduled.  Do not eat food or drink liquids after midnight. No gum chewing or hard candies.  You may however, drink CLEAR liquids up to 2 hours before you are scheduled to arrive at the hospital for your procedure.  Do not drink clear liquids within 2 hours of your scheduled arrival to the hospital as this may lead to your procedure being delayed or rescheduled.  Clear liquids include: - water  - apple juice without pulp - clear gatorade - black coffee or tea (NO milk, creamers, sugars) DO NOT drink anything not on this list.   No Alcohol for 24 hours before or after surgery.  No Smoking for 24 hours prior to surgery.  Notify your doctor if there is any change in your medical condition (cold, fever, infection).  Do not wear jewelry, make-up, hairpins, clips or nail polish.  Do not wear lotions, powders, or perfumes.   Do not shave 48 hours prior to surgery. Men may shave face and neck.  Contacts and dentures may not be worn into surgery.  Do not bring valuables to the hospital. [facility] is not responsible for any belongings or valuables.   TAKE THESE MEDICATIONS THE MORNING OF SURGERY WITH A SIP OF WATER: NONE    Use CHG Soap or wipes as directed on instruction sheet.   Stop Anti-inflammatories such as Advil, Aleve, Ibuprofen, Motrin, Naproxen, Naprosyn, Goodie powder, or aspirin products. (May take Tylenol or Acetaminophen and Celebrex if needed.)  If you are being discharged the day of surgery, you will not be allowed to drive home. You will need someone to drive you  home and stay with you that night.   If you are taking public transportation, you will need to have a responsible adult to with you.  Please call the number above if you have any questions about these instructions.

## 2017-01-15 ENCOUNTER — Ambulatory Visit
Admission: RE | Admit: 2017-01-15 | Discharge: 2017-01-15 | Disposition: A | Discharge status: Home / Self Care | Payer: BLUE CROSS/BLUE SHIELD | Source: Ambulatory Visit | Attending: Cardiothoracic Surgery | Admitting: Cardiothoracic Surgery

## 2017-01-15 ENCOUNTER — Encounter: Payer: Self-pay | Admitting: *Deleted

## 2017-01-15 ENCOUNTER — Ambulatory Visit: Payer: BLUE CROSS/BLUE SHIELD | Admitting: Anesthesiology

## 2017-01-15 ENCOUNTER — Encounter
Admission: RE | Disposition: A | Discharge status: Home / Self Care | Payer: Self-pay | Source: Ambulatory Visit | Attending: Cardiothoracic Surgery

## 2017-01-15 DIAGNOSIS — F17200 Nicotine dependence, unspecified, uncomplicated: Secondary | ICD-10-CM | POA: Insufficient documentation

## 2017-01-15 DIAGNOSIS — J449 Chronic obstructive pulmonary disease, unspecified: Secondary | ICD-10-CM | POA: Insufficient documentation

## 2017-01-15 DIAGNOSIS — Z9221 Personal history of antineoplastic chemotherapy: Secondary | ICD-10-CM | POA: Diagnosis not present

## 2017-01-15 DIAGNOSIS — Z452 Encounter for adjustment and management of vascular access device: Secondary | ICD-10-CM | POA: Diagnosis not present

## 2017-01-15 HISTORY — PX: PORT-A-CATH REMOVAL: SHX5289

## 2017-01-15 LAB — URINE DRUG SCREEN, QUALITATIVE (ARMC ONLY)
AMPHETAMINES, UR SCREEN: NOT DETECTED
Barbiturates, Ur Screen: NOT DETECTED
Benzodiazepine, Ur Scrn: NOT DETECTED
COCAINE METABOLITE, UR ~~LOC~~: POSITIVE — AB
Cannabinoid 50 Ng, Ur ~~LOC~~: POSITIVE — AB
MDMA (ECSTASY) UR SCREEN: NOT DETECTED
METHADONE SCREEN, URINE: NOT DETECTED
Opiate, Ur Screen: NOT DETECTED
Phencyclidine (PCP) Ur S: NOT DETECTED
TRICYCLIC, UR SCREEN: NOT DETECTED

## 2017-01-15 SURGERY — REMOVAL PORT-A-CATH
Anesthesia: General

## 2017-01-15 MED ORDER — ONDANSETRON HCL 4 MG/2ML IJ SOLN: 4.0000 mg | Freq: Once | INTRAMUSCULAR | Status: DC | PRN

## 2017-01-15 MED ORDER — PROPOFOL 10 MG/ML IV BOLUS
INTRAVENOUS | Status: AC
  Filled 2017-01-15: qty 20

## 2017-01-15 MED ORDER — FENTANYL CITRATE (PF) 100 MCG/2ML IJ SOLN: 25.0000 ug | INTRAMUSCULAR | Status: DC | PRN

## 2017-01-15 MED ORDER — BUPIVACAINE HCL (PF) 0.5 % IJ SOLN
INTRAMUSCULAR | Status: DC | PRN
  Administered 2017-01-15: 6 mL

## 2017-01-15 MED ORDER — MIDAZOLAM HCL 2 MG/2ML IJ SOLN
INTRAMUSCULAR | Status: DC | PRN
  Administered 2017-01-15: 2 mg via INTRAVENOUS

## 2017-01-15 MED ORDER — FAMOTIDINE 20 MG PO TABS
ORAL_TABLET | ORAL | Status: AC
Start: 2017-01-15 — End: 2017-01-15
  Administered 2017-01-15: 20 mg via ORAL
  Filled 2017-01-15: qty 1

## 2017-01-15 MED ORDER — CHLORHEXIDINE GLUCONATE CLOTH 2 % EX PADS: 6.0000 | MEDICATED_PAD | Freq: Once | CUTANEOUS | Status: DC

## 2017-01-15 MED ORDER — BUPIVACAINE HCL (PF) 0.5 % IJ SOLN
INTRAMUSCULAR | Status: AC
  Filled 2017-01-15: qty 30

## 2017-01-15 MED ORDER — CEFAZOLIN SODIUM-DEXTROSE 2-4 GM/100ML-% IV SOLN
INTRAVENOUS | Status: AC
  Filled 2017-01-15: qty 100

## 2017-01-15 MED ORDER — FENTANYL CITRATE (PF) 100 MCG/2ML IJ SOLN
INTRAMUSCULAR | Status: DC | PRN
  Administered 2017-01-15: 50 ug via INTRAVENOUS

## 2017-01-15 MED ORDER — FENTANYL CITRATE (PF) 100 MCG/2ML IJ SOLN
INTRAMUSCULAR | Status: AC
  Filled 2017-01-15: qty 2

## 2017-01-15 MED ORDER — MIDAZOLAM HCL 2 MG/2ML IJ SOLN
INTRAMUSCULAR | Status: AC
  Filled 2017-01-15: qty 2

## 2017-01-15 MED ORDER — FAMOTIDINE 20 MG PO TABS
20.0000 mg | ORAL_TABLET | Freq: Once | ORAL | Status: AC
  Administered 2017-01-15: 20 mg via ORAL

## 2017-01-15 MED ORDER — LIDOCAINE HCL (PF) 2 % IJ SOLN
INTRAMUSCULAR | Status: AC
  Filled 2017-01-15: qty 10

## 2017-01-15 MED ORDER — LACTATED RINGERS IV SOLN
INTRAVENOUS | Status: DC
  Administered 2017-01-15 (×2): via INTRAVENOUS

## 2017-01-15 MED ORDER — LIDOCAINE HCL (PF) 1 % IJ SOLN
INTRAMUSCULAR | Status: AC
  Filled 2017-01-15: qty 30

## 2017-01-15 SURGICAL SUPPLY — 24 items
BAG BIOHAZARD 6X9 CLR ZIPLOCK (MISCELLANEOUS) ×2 IMPLANT
CANISTER SUCT 1200ML W/VALVE (MISCELLANEOUS) ×2 IMPLANT
CHLORAPREP W/TINT 26ML (MISCELLANEOUS) ×2 IMPLANT
COVER LIGHT HANDLE STERIS (MISCELLANEOUS) ×4 IMPLANT
DRAPE INCISE IOBAN 66X45 STRL (DRAPES) ×2 IMPLANT
DRSG TEGADERM 4X4.75 (GAUZE/BANDAGES/DRESSINGS) ×2 IMPLANT
DRSG TELFA 4X3 1S NADH ST (GAUZE/BANDAGES/DRESSINGS) ×2 IMPLANT
ELECT REM PT RETURN 9FT ADLT (ELECTROSURGICAL) ×2
ELECTRODE REM PT RTRN 9FT ADLT (ELECTROSURGICAL) ×1 IMPLANT
GAUZE SPONGE 4X4 12PLY STRL (GAUZE/BANDAGES/DRESSINGS) ×2 IMPLANT
GLOVE SURG SYN 7.5  E (GLOVE) ×1
GLOVE SURG SYN 7.5 E (GLOVE) ×1 IMPLANT
GOWN STRL REUS W/ TWL LRG LVL3 (GOWN DISPOSABLE) ×2 IMPLANT
GOWN STRL REUS W/TWL LRG LVL3 (GOWN DISPOSABLE) ×2
KIT RM TURNOVER STRD PROC AR (KITS) ×2 IMPLANT
LABEL OR SOLS (LABEL) ×2 IMPLANT
PACK PORT-A-CATH (MISCELLANEOUS) ×2 IMPLANT
STRIP CLOSURE SKIN 1/4X4 (GAUZE/BANDAGES/DRESSINGS) ×2 IMPLANT
SUT ETHILON 4-0 (SUTURE) ×1
SUT ETHILON 4-0 FS2 18XMFL BLK (SUTURE) ×1
SUT VIC AB 3-0 SH 27 (SUTURE) ×1
SUT VIC AB 3-0 SH 27X BRD (SUTURE) ×1 IMPLANT
SUT VIC AB 4-0 FS2 27 (SUTURE) ×2 IMPLANT
SUTURE ETHLN 4-0 FS2 18XMF BLK (SUTURE) ×1 IMPLANT

## 2017-01-15 NOTE — Interval H&P Note (Signed)
History and Physical Interval Note:  01/15/2017 7:23 AM  Mario [name]  has presented today for surgery, with the diagnosis of STATUS POST CHEMOTHERAPY  The various methods of treatment have been discussed with the patient and family. After consideration of risks, benefits and other options for treatment, the patient has consented to  Procedure(s): REMOVAL PORT-A-CATH (N/A) as a surgical intervention .  The patient's history has been reviewed, patient examined, no change in status, stable for surgery.  I have reviewed the patient's chart and labs.  Questions were answered to the patient's satisfaction.     Nestor Lewandowsky

## 2017-01-15 NOTE — Anesthesia Preprocedure Evaluation (Signed)
Anesthesia Evaluation  Patient identified by MRN, date of birth, ID band Patient awake    Reviewed: Allergy & Precautions, NPO status , Patient's Chart, lab work & pertinent test results  Airway Mallampati: II       Dental  (+) Teeth Intact   Pulmonary COPD, Current Smoker,     + decreased breath sounds      Cardiovascular Exercise Tolerance: Good  Rhythm:Regular Rate:Normal     Neuro/Psych negative neurological ROS  negative psych ROS   GI/Hepatic negative GI ROS, Neg liver ROS,   Endo/Other  negative endocrine ROS  Renal/GU   negative genitourinary   Musculoskeletal   Abdominal Normal abdominal exam  (+)   Peds negative pediatric ROS (+)  Hematology negative hematology ROS (+)   Anesthesia Other Findings   Reproductive/Obstetrics                             Anesthesia Physical Anesthesia Plan  ASA: III  Anesthesia Plan: General   Post-op Pain Management:    Induction: Intravenous  PONV Risk Score and Plan: 0  Airway Management Planned: LMA  Additional Equipment:   Intra-op Plan:   Post-operative Plan: Extubation in OR  Informed Consent: I have reviewed the patients History and Physical, chart, labs and discussed the procedure including the risks, benefits and alternatives for the proposed anesthesia with the patient or authorized representative who has indicated his/her understanding and acceptance.     Plan Discussed with: CRNA  Anesthesia Plan Comments:         Anesthesia Quick Evaluation

## 2017-01-15 NOTE — Anesthesia Post-op Follow-up Note (Signed)
Anesthesia QCDR form completed.        

## 2017-01-15 NOTE — Transfer of Care (Signed)
Immediate Anesthesia Transfer of Care Note  Patient: Mario [name]  Procedure(s) Performed: REMOVAL PORT-A-CATH (N/A )  Patient Location: PACU  Anesthesia Type:MAC  Level of Consciousness: sedated  Airway & Oxygen Therapy: Patient Spontanous Breathing and Patient connected to nasal cannula oxygen  Post-op Assessment: Report given to RN and Post -op Vital signs reviewed and stable  Post vital signs: Reviewed and stable  Last Vitals:  Vitals:   01/15/17 0655  BP: (!) 148/91  Pulse: (!) 58  Resp: 18  Temp: 36.6 C  SpO2: 100%    Last Pain:  Vitals:   01/15/17 0655  TempSrc: Tympanic  PainSc: 0-No pain         Complications: No apparent anesthesia complications

## 2017-01-15 NOTE — Anesthesia Postprocedure Evaluation (Signed)
Anesthesia Post Note  Patient: Mario [name]  Procedure(s) Performed: REMOVAL PORT-A-CATH (N/A )  Patient location during evaluation: PACU Anesthesia Type: General Level of consciousness: awake Pain management: pain level controlled Vital Signs Assessment: post-procedure vital signs reviewed and stable Respiratory status: spontaneous breathing Cardiovascular status: stable Anesthetic complications: no     Last Vitals:  Vitals:   01/15/17 0904 01/15/17 0906  BP:  108/72  Pulse: (!) 50   Resp: 18   Temp: 36.4 C   SpO2: 100%     Last Pain:  Vitals:   01/15/17 0904  TempSrc: Temporal  PainSc: 3                  VAN STAVEREN,Farrel Guimond

## 2017-01-15 NOTE — Op Note (Signed)
  01/15/2017  8:34 AM  PATIENT:  Mario [name]  57 y.o. male  PRE-OPERATIVE DIAGNOSIS:  Nonfunctioning Port-A-Cath  POST-OPERATIVE DIAGNOSIS:  Same  PROCEDURE:  Removal of Port-A-Cath  SURGEON:  Surgeon(s) and Role:    Nestor Lewandowsky, MD - Primary  ASSISTANTS: Venita Sheffield PAS  ANESTHESIA: Local with intravenous sedation  INDICATIONS FOR PROCEDURE nonfunctioning Port-A-Cath.  This patient is status post radiation therapy and chemotherapy for nonresectable lung cancer. His catheter has been difficult access and for this reason he was offered the above-named procedure for treatment  DICTATION: Patient brought to the operating suite and placed in the supine position. Intravenous sedation was given. The patient was prepped and draped in usual sterile fashion. 1/2% Marcaine was used as a local anesthetic. The previous wound was reopened. The Port-A-Cath was then identified and withdrawn from the subclavian vein. The catheter was then excised from the surrounding tissues. All Prolene sutures were removed. The pseudocapsule was removed. Hemostasis was complete. The wounds were then closed by reapproximating the deeper tissues and closing the dead space. Subcutaneous tissues were closed with 3-0 Vicryl and the skin with 4-0 nylon. The patient hard the procedure well was taken to the recovery room in stable condition. All sponge needle and instrument counts were correct as reported to me at the end of the case.   Nestor Lewandowsky, MD

## 2017-01-15 NOTE — Discharge Instructions (Signed)

## 2017-01-15 NOTE — Anesthesia Procedure Notes (Signed)
Procedure Name: MAC Date/Time: 01/15/2017 7:45 AM Performed by: Allean Found Pre-anesthesia Checklist: Patient identified, Emergency Drugs available, Suction available, Patient being monitored and Timeout performed Patient Re-evaluated:Patient Re-evaluated prior to induction Oxygen Delivery Method: Nasal cannula Placement Confirmation: positive ETCO2 Dental Injury: Teeth and Oropharynx as per pre-operative assessment

## 2017-01-15 NOTE — H&P (View-Only) (Signed)
  Patient ID: Mario [name], male   DOB: 1960/07/09, 56 y.o.   MRN: [id number]  HISTORY: This patient returns today in follow-up. Previously had a right upper lobe mass which is treated with a combination of radiation therapy and chemotherapy. He has now completed his therapy and desires to have his Port-A-Cath removed. He is a very thin gentleman with a little subcutaneous tissue. He did have 1 suture erode through the skin and there is a second suture that appears to be about to do the same. He finds this somewhat uncomfortable and wishes the catheter to be removed.   Vitals:   01/07/17 1048  BP: 103/64  Pulse: 69  Resp: 16  Temp: 98.2 F (36.8 C)  SpO2: 98%     EXAM:    Resp: Lungs are clear bilaterally.  No respiratory distress, normal effort. Heart:  Regular without murmurs Abd:  Abdomen is soft, non distended and non tender. No masses are palpable.  There is no rebound and no guarding.  Neurological: Alert and oriented to person, place, and time. Coordination normal.  Skin: Skin is warm and dry. No rash noted. No diaphoretic. No erythema. No pallor.  Psychiatric: Normal mood and affect. Normal behavior. Judgment and thought content normal.    ASSESSMENT: I have independently reviewed his last CT scan. This is from several months ago but shows the catheter to be in the superior vena cava. There is no complicating features to suggest any problems with removal.   PLAN:   I explained to the patient the indications and risks of Port-A-Cath removal. Will check a CBC and a metabolic panel today. We will remove his catheter next week. I explained to him that he will need to come back once the catheters removed for suture removal as well. He is in agreement.    Nestor Lewandowsky, MD

## 2017-01-17 ENCOUNTER — Telehealth: Payer: Self-pay

## 2017-01-17 NOTE — Telephone Encounter (Signed)
Post-op call made to patient at this time. Unable to leave a voicemail due to mailbox not being setup.

## 2017-01-20 ENCOUNTER — Encounter: Payer: Self-pay | Admitting: Cardiothoracic Surgery

## 2017-01-22 ENCOUNTER — Other Ambulatory Visit: Payer: Self-pay

## 2017-01-24 ENCOUNTER — Encounter: Payer: BLUE CROSS/BLUE SHIELD | Admitting: Surgery

## 2017-01-25 ENCOUNTER — Telehealth: Payer: Self-pay | Admitting: Cardiothoracic Surgery

## 2017-01-25 ENCOUNTER — Encounter: Payer: Self-pay | Admitting: Cardiothoracic Surgery

## 2017-01-25 NOTE — Telephone Encounter (Signed)
Unable to leave message for patient to call the office to r/s his no showed appointment on 01/24/17. I have mailed a letter to the patient to call the office, if patient calls back please r/s his appointment.

## 2017-02-01 ENCOUNTER — Ambulatory Visit (INDEPENDENT_AMBULATORY_CARE_PROVIDER_SITE_OTHER): Payer: BLUE CROSS/BLUE SHIELD | Admitting: Cardiothoracic Surgery

## 2017-02-01 ENCOUNTER — Encounter: Payer: Self-pay | Admitting: Cardiothoracic Surgery

## 2017-02-01 VITALS — BP 128/66 | HR 73 | Temp 98.2°F | Wt 116.0 lb

## 2017-02-01 DIAGNOSIS — Z9221 Personal history of antineoplastic chemotherapy: Secondary | ICD-10-CM

## 2017-02-01 NOTE — Patient Instructions (Signed)
Please give us a call in case you have any questions or concerns.  

## 2017-02-15 NOTE — Progress Notes (Signed)
He returns to have his Port-A-Cath stitches removed.  All of his wounds look good.  There is no drainage from the wounds.  We did remove all the sutures and Place Steri-Strips.  No follow-up was made.

## 2017-02-28 ENCOUNTER — Ambulatory Visit: Admission: RE | Admit: 2017-02-28 | Payer: BLUE CROSS/BLUE SHIELD | Source: Ambulatory Visit

## 2017-03-06 ENCOUNTER — Ambulatory Visit
Admission: RE | Admit: 2017-03-06 | Discharge: 2017-03-06 | Disposition: A | Discharge status: Home / Self Care | Payer: BLUE CROSS/BLUE SHIELD | Source: Ambulatory Visit | Attending: Radiation Oncology | Admitting: Radiation Oncology

## 2017-03-06 DIAGNOSIS — I2510 Atherosclerotic heart disease of native coronary artery without angina pectoris: Secondary | ICD-10-CM | POA: Diagnosis not present

## 2017-03-06 DIAGNOSIS — I700 Atherosclerosis of aorta: Secondary | ICD-10-CM | POA: Insufficient documentation

## 2017-03-06 DIAGNOSIS — J432 Centrilobular emphysema: Secondary | ICD-10-CM | POA: Diagnosis not present

## 2017-03-06 DIAGNOSIS — C3411 Malignant neoplasm of upper lobe, right bronchus or lung: Secondary | ICD-10-CM

## 2017-03-06 MED ORDER — IOPAMIDOL (ISOVUE-300) INJECTION 61%
75.0000 mL | Freq: Once | INTRAVENOUS | Status: AC | PRN
  Administered 2017-03-06: 75 mL via INTRAVENOUS

## 2017-03-07 ENCOUNTER — Ambulatory Visit: Payer: BLUE CROSS/BLUE SHIELD | Attending: Radiation Oncology | Admitting: Radiation Oncology

## 2017-04-12 ENCOUNTER — Emergency Department
Admission: EM | Admit: 2017-04-12 | Discharge: 2017-04-12 | Disposition: A | Discharge status: Home / Self Care | Payer: BLUE CROSS/BLUE SHIELD | Attending: Emergency Medicine | Admitting: Emergency Medicine

## 2017-04-12 ENCOUNTER — Other Ambulatory Visit: Payer: Self-pay

## 2017-04-12 ENCOUNTER — Emergency Department: Payer: BLUE CROSS/BLUE SHIELD

## 2017-04-12 ENCOUNTER — Encounter: Payer: Self-pay | Admitting: Emergency Medicine

## 2017-04-12 DIAGNOSIS — Y929 Unspecified place or not applicable: Secondary | ICD-10-CM | POA: Diagnosis not present

## 2017-04-12 DIAGNOSIS — H9201 Otalgia, right ear: Secondary | ICD-10-CM | POA: Insufficient documentation

## 2017-04-12 DIAGNOSIS — Y93K1 Activity, walking an animal: Secondary | ICD-10-CM | POA: Diagnosis not present

## 2017-04-12 DIAGNOSIS — F17210 Nicotine dependence, cigarettes, uncomplicated: Secondary | ICD-10-CM | POA: Diagnosis not present

## 2017-04-12 DIAGNOSIS — Y999 Unspecified external cause status: Secondary | ICD-10-CM | POA: Insufficient documentation

## 2017-04-12 DIAGNOSIS — S9031XA Contusion of right foot, initial encounter: Secondary | ICD-10-CM

## 2017-04-12 DIAGNOSIS — Z79899 Other long term (current) drug therapy: Secondary | ICD-10-CM | POA: Diagnosis not present

## 2017-04-12 DIAGNOSIS — Z85118 Personal history of other malignant neoplasm of bronchus and lung: Secondary | ICD-10-CM | POA: Insufficient documentation

## 2017-04-12 DIAGNOSIS — W01198A Fall on same level from slipping, tripping and stumbling with subsequent striking against other object, initial encounter: Secondary | ICD-10-CM | POA: Diagnosis not present

## 2017-04-12 DIAGNOSIS — S0990XA Unspecified injury of head, initial encounter: Secondary | ICD-10-CM

## 2017-04-12 DIAGNOSIS — H81391 Other peripheral vertigo, right ear: Secondary | ICD-10-CM

## 2017-04-12 DIAGNOSIS — R42 Dizziness and giddiness: Secondary | ICD-10-CM | POA: Diagnosis present

## 2017-04-12 NOTE — ED Provider Notes (Signed)
[facility] Emergency Department Provider Note  ____________________________________________  Time seen: Approximately 8:23 AM  I have reviewed the triage vital signs and the nursing notes.   HISTORY  Chief Complaint Dizziness; Ankle Pain; and Otalgia   HPI Mario [name] is a 57 y.o. male who presents for evaluation of right foot pain and vertigo. Patient reports yesterday he was taking his dog for a walk when he tripped and fell. He hit his foot and his right years/right side of his head onto the dog's house. Patient noted a small amount of blood coming from the right ear. Since then patient has been having intermittent episodes of vertigo that happen with movement and resolves at rest. No headache, no LOC, patient is not on blood thinners.No unilateral weakness or numbness, slurred speech, facial droop. Patient has no vertigo at this time however had an episode at work earlier today and was sent here by his boss for evaluation. He is complaining of moderate constant pain located at the fifth metatarsal region, worse with weightbearing since yesterday when he fell. The pain does not radiate. He hasn't tried anything at home for it.  Past Medical History:  Diagnosis Date  . Arthritis   . Cancer of upper lobe of right lung ([place]) 09/19/2015  . Chicken pox   . Chronic pain 09/19/2015  . Current tobacco use 05/26/2014  . Elevated LFTs 09/19/2015  . Fracture of lower extremity 09/19/2015  . Kidney stones   . Lung cancer ([place]) 2015  . OA (osteoarthritis) of knee 05/26/2014  . Status post chemotherapy     Patient Active Problem List   Diagnosis Date Noted  . Status post chemotherapy   . 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 DDD (degenerative disc disease), cervical 11/02/2015  . Fracture of lower extremity 09/19/2015  . Elevated LFTs 09/19/2015  . Chronic pain 09/19/2015  . Cancer of upper lobe of right lung ([place]) 09/19/2015  . Encounter  for antineoplastic chemotherapy 09/19/2015  . Current tobacco use 05/26/2014  . OA (osteoarthritis) of knee 05/26/2014  . CALCANEAL FRACTURE 01/21/2008  . ANKLE SPRAIN, RIGHT 01/21/2008    Past Surgical History:  Procedure Laterality Date  . amputation of index finger Right 2010  . HAND SURGERY Right 2010   ORIF of right index finder, 2nd carpometacarpal joint dislocation right hand, proximal right humerus  . ORIF FEMUR FRACTURE Right 2010  . PORT-A-CATH REMOVAL N/A 01/15/2017   Procedure: REMOVAL PORT-A-CATH;  Surgeon: Nestor Lewandowsky, MD;  Location: [facility] ORS;  Service: General;  Laterality: N/A;  . SHOULDER SURGERY Right 2010   pins and rods in shoulder    Prior to Admission medications   Medication Sig Start Date End Date Taking? Authorizing Provider  Multiple Vitamin (MULTI-VITAMINS) TABS Take 1 tablet by mouth daily.   Yes [provider]  nicotine (NICODERM CQ - DOSED IN MG/24 HOURS) 14 mg/24hr patch Place 14 mg onto the skin daily.    Yes [provider]  oxyCODONE (OXYCONTIN) 10 mg 12 hr tablet Take 10 mg by mouth every 12 (twelve) hours.   Yes [provider]    Allergies Patient has no active allergies.  History reviewed. No pertinent family history.  Social History Social History   Tobacco Use  . Smoking status: Current Every Day Smoker    Packs/day: 2.00    Years: 20.00    Pack years: 40.00    Types: Cigarettes  . Smokeless tobacco: Never Used  . Tobacco  comment: down to 3 cigarettes a month  Substance Use Topics  . Alcohol use: Yes    Alcohol/week: 0.0 oz    Comment: 2-4 Beers Weekly  . Drug use: Yes    Types: Marijuana    Comment: last use 3 days ago    Review of Systems  Constitutional: Negative for fever. Eyes: Negative for visual changes. ENT: Negative for sore throat. + R ear pain Neck: No neck pain  Cardiovascular: Negative for chest pain. Respiratory: Negative for shortness of breath. Gastrointestinal: Negative  for abdominal pain, vomiting or diarrhea. Genitourinary: Negative for dysuria. Musculoskeletal: Negative for back pain. + R foot pain Skin: Negative for rash. Neurological: Negative for headaches, weakness or numbness. + vertigo Psych: No SI or HI  ____________________________________________   PHYSICAL EXAM:  VITAL SIGNS: ED Triage Vitals [04/12/17 0753]  Enc Vitals Group     BP 117/62     Pulse Rate 68     Resp 18     Temp 98.1 F (36.7 C)     Temp Source Oral     SpO2      Weight 122 lb (55.3 kg)     Height 5\' 9"  (1.753 m)     Head Circumference      Peak Flow      Pain Score 7     Pain Loc      Pain Edu?      Excl. in [place]?     Constitutional: Alert and oriented. Well appearing and in no apparent distress. HEENT:      Head: Normocephalic and atraumatic.         Eyes: Conjunctivae are normal. Sclera is non-icteric. EOMI, PERRL, no raccoon eyes      Mouth/Throat: Mucous membranes are moist.       Ear: TMs are visualized, intact, no hemotympanum, no battle sign, no trauma      Neck: Supple with no signs of meningismus. No c-pine tenderness Cardiovascular: Regular rate and rhythm. No murmurs, gallops, or rubs. 2+ symmetrical distal pulses are present in all extremities. No JVD. Respiratory: Normal respiratory effort. Lungs are clear to auscultation bilaterally. No wheezes, crackles, or rhonchi.  Gastrointestinal: Soft, non tender, and non distended with positive bowel sounds. No rebound or guarding. Musculoskeletal: Nontender with normal range of motion in all extremities. No edema, cyanosis, or erythema of extremities. Neurologic: Normal speech and language. A & O x3, PERRL, EOMI, no nystagmus, CN II-XII intact, motor testing reveals good tone and bulk throughout. There is no evidence of pronator drift or dysmetria. Muscle strength is 5/5 throughout. Sensory examination is intact. Gait is normal. Epley maneuver done with no vertigo. Skin: Skin is warm, dry and intact. No  rash noted. Psychiatric: Mood and affect are normal. Speech and behavior are normal.  ____________________________________________   LABS (all labs ordered are listed, but only abnormal results are displayed)  Labs Reviewed - No data to display ____________________________________________  EKG  none ____________________________________________  RADIOLOGY  CT head: Negative   XR R foot: Negative ____________________________________________   PROCEDURES  Procedure(s) performed: None Procedures Critical Care performed:  None ____________________________________________   INITIAL IMPRESSION / ASSESSMENT AND PLAN / ED COURSE   57 y.o. male who presents for evaluation of right foot pain and vertigo since a fall yesterday where he hit twisted his R foot and hit his R ear onto the dog house. Neuro intact, no nystagmus, TM intact, no signs of basilar skull fracture, no vertigo during my evaluation. Head CT  ordered. Presentation most likely due to peripheral vertigo in the setting of trauma  R foot with ttp over the base of the 5th metatarsal. Patient has been ambulatory since trauma. XR pending.    _________________________ 9:18 AM on 04/12/2017 -----------------------------------------  Imaging studies with no acute findings. Patient remains well appearing with no vertigo. We'll discharge home with supportive care and follow-up with primary care doctor. Discussed return precautions for any signs of central vertigo.   As part of my medical decision making, I reviewed the following data within the [facility] notes reviewed and incorporated, Radiograph reviewed , Notes from prior ED visits and [place] Controlled Substance Database    Pertinent labs & imaging results that were available during my care of the patient were reviewed by me and considered in my medical decision making (see chart for  details).    ____________________________________________   FINAL CLINICAL IMPRESSION(S) / ED DIAGNOSES  Final diagnoses:  Contusion of right foot, initial encounter  Traumatic injury of head, initial encounter  Peripheral vertigo involving right ear      NEW MEDICATIONS STARTED DURING THIS VISIT:  ED Discharge Orders    None       Note:  This document was prepared using Dragon voice recognition software and may include unintentional dictation errors.    Rudene Re, MD 04/12/17 [phone]

## 2017-04-12 NOTE — ED Notes (Addendum)
Pt had trip and fall over dog at home, yesterday. Pt c/o right ear pain from where he hit right ear on dog crate. No abrasions or loss in skin integrity visible. C/o right ankle pain to palpation. No obvious deformity or loss of skin intergrity. Pt continues to experience dizziness since the wall. Pt states that he feels like the room is spinning around with standing. No dizziness with sitting. Pt alert and oriented X4, active, cooperative, pt in NAD. RR even and unlabored, color WNL.

## 2017-04-12 NOTE — ED Notes (Signed)
Pt alert and oriented X4, active, cooperative, pt in NAD. RR even and unlabored, color WNL.  Pt informed to return if any life threatening symptoms occur.  Discharge and followup instructions reviewed.  

## 2017-04-12 NOTE — ED Triage Notes (Addendum)
Pt states he was tripped by a dog at home yesterday morning when he was going to the bathroom, states he twisted his right ankle and hit his ear on the dog's box in the house.   Pt states since the fall he has also been dizzy. Was told to leave work to come to hospital because of being dizzy. Pt states he was not dizzy until after he fell. Was not dizzy prior to falling.   Pt states pain to ear and ankle is 7/10. Pt ambulatory in triage.

## 2017-05-02 ENCOUNTER — Other Ambulatory Visit: Payer: Self-pay

## 2017-05-02 DIAGNOSIS — C3411 Malignant neoplasm of upper lobe, right bronchus or lung: Secondary | ICD-10-CM

## 2017-05-03 ENCOUNTER — Inpatient Hospital Stay: Payer: BLUE CROSS/BLUE SHIELD | Attending: Internal Medicine

## 2017-05-03 ENCOUNTER — Inpatient Hospital Stay (HOSPITAL_BASED_OUTPATIENT_CLINIC_OR_DEPARTMENT_OTHER): Payer: BLUE CROSS/BLUE SHIELD | Admitting: Internal Medicine

## 2017-05-03 ENCOUNTER — Encounter: Payer: Self-pay | Admitting: Internal Medicine

## 2017-05-03 DIAGNOSIS — Z87442 Personal history of urinary calculi: Secondary | ICD-10-CM | POA: Insufficient documentation

## 2017-05-03 DIAGNOSIS — Z9221 Personal history of antineoplastic chemotherapy: Secondary | ICD-10-CM

## 2017-05-03 DIAGNOSIS — G8929 Other chronic pain: Secondary | ICD-10-CM | POA: Insufficient documentation

## 2017-05-03 DIAGNOSIS — J439 Emphysema, unspecified: Secondary | ICD-10-CM | POA: Insufficient documentation

## 2017-05-03 DIAGNOSIS — F17210 Nicotine dependence, cigarettes, uncomplicated: Secondary | ICD-10-CM | POA: Insufficient documentation

## 2017-05-03 DIAGNOSIS — R634 Abnormal weight loss: Secondary | ICD-10-CM

## 2017-05-03 DIAGNOSIS — C3411 Malignant neoplasm of upper lobe, right bronchus or lung: Secondary | ICD-10-CM

## 2017-05-03 LAB — COMPREHENSIVE METABOLIC PANEL
ALT: 12 U/L — AB (ref 17–63)
ANION GAP: 5 (ref 5–15)
AST: 25 U/L (ref 15–41)
Albumin: 3.5 g/dL (ref 3.5–5.0)
Alkaline Phosphatase: 34 U/L — ABNORMAL LOW (ref 38–126)
BUN: 11 mg/dL (ref 6–20)
CHLORIDE: 102 mmol/L (ref 101–111)
CO2: 30 mmol/L (ref 22–32)
CREATININE: 1.03 mg/dL (ref 0.61–1.24)
Calcium: 8.8 mg/dL — ABNORMAL LOW (ref 8.9–10.3)
GFR calc non Af Amer: >60 mL/min (ref >60)
Glucose, Bld: 102 mg/dL — ABNORMAL HIGH (ref 65–99)
Potassium: 4.8 mmol/L (ref 3.5–5.1)
SODIUM: 137 mmol/L (ref 135–145)
Total Bilirubin: 0.5 mg/dL (ref 0.3–1.2)
Total Protein: 7.7 g/dL (ref 6.5–8.1)

## 2017-05-03 LAB — CBC WITH DIFFERENTIAL/PLATELET
BASOS PCT: 1 %
Basophils Absolute: 0.1 10*3/uL (ref 0–0.1)
EOS ABS: 0.1 10*3/uL (ref 0–0.7)
Eosinophils Relative: 2 %
HCT: 38.6 % — ABNORMAL LOW (ref 40.0–52.0)
Hemoglobin: 12.5 g/dL — ABNORMAL LOW (ref 13.0–18.0)
LYMPHS ABS: 2.2 10*3/uL (ref 1.0–3.6)
Lymphocytes Relative: 44 %
MCH: 28.9 pg (ref 26.0–34.0)
MCHC: 32.4 g/dL (ref 32.0–36.0)
MCV: 89.4 fL (ref 80.0–100.0)
Monocytes Absolute: 0.8 10*3/uL (ref 0.2–1.0)
Monocytes Relative: 16 %
Neutro Abs: 1.9 10*3/uL (ref 1.4–6.5)
Neutrophils Relative %: 37 %
PLATELETS: 217 10*3/uL (ref 150–440)
RBC: 4.32 MIL/uL — AB (ref 4.40–5.90)
RDW: 13 % (ref 11.5–14.5)
WBC: 5.1 10*3/uL (ref 3.8–10.6)

## 2017-05-03 MED ORDER — PREDNISONE 20 MG PO TABS: 20.0000 mg | ORAL_TABLET | Freq: Every day | ORAL | 0 refills | Status: DC

## 2017-05-03 NOTE — Assessment & Plan Note (Addendum)
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  Patient currently on surveillance.  #  RUL pleural based nodule/ RUL lesion ~17 mm. S/p SBRT [june 21st 2018]; CT scan 10/29/2016 shows stable lung nodule; no new lung lesions.   #  LFT elevation/hepC- AST/ALT- 3-5 times the upper limit; from hep C- resolved; on treatment. Not from immunotherapy.   # Chronic pain- unrelated to cancer follow up with pain clinic  #Poor appetite/unable to gain weight-likely secondary to COPD/smoking.  Recommend trial of prednisone.  # smoking-counseled the patient regarding smoking; interested in quitting; will make a referral to [facility] smoking cessation program  # follow up in 3 months/ labs; CT prior.

## 2017-05-03 NOTE — Progress Notes (Signed)
.[place] OFFICE PROGRESS NOTE  Patient Care Team: Tracie Harrier, MD as PCP - General (Internal Medicine) Mohammed Kindle, MD as Attending Physician (Pain Medicine)   SUMMARY OF ONCOLOGIC HISTORY:  Oncology History   # MARCH 2016- LUNG CA- RUL- ADENO CA [mol testing-NA sec to poor samples] STAGE III s/p chemo-RT; [SEP 2016-CT Bx for mol testing; neg for malignancy]; CT-29th  NOV 2016-  Stable/ improved right upper lobe lesion;   # RECURRENT- CT JAN 2017- Stable right upper lobe lung nodule ~ 2.5cm; but progressive RML ~1.8cm; Start Tecentriq q 3W x3 ; March 30th CT 2017- Improved Lung Lesions; RML- 1.6x1.0; RUL ~3cm.; AUG 2nd 2017-CT stable.   # April/May 2018- RUL- local Progression on CT/PET- referral to rad-Onc  # July 2017 Elevated LFTs- likely from Hepatitis C [resolved on treatment- July 2018]; NOT from Tecentriq.  # Hep C [june 2018; treatment at [facility]]  # Chronic pain- on oxycodone.jan 2017-Bone scan- neg.       Cancer of upper lobe of right lung ([place])     INTERVAL HISTORY:  Vague historian.  57 year old male patient with above history of  Adenocarcinoma/Recurrent currently on  Status post SB RT  Local progression of right upper lobe lung nodule [ finished in 09/20/2016]; is here for follow-up.  Appetite is poor.  He has not lost any weight.  Interested in any medications to help his weight.  Unfortunately continues to smoke.  Chronic cough no hemoptysis. Denies any nausea vomiting abdominal pain.  He denies any worsening shortness of breath or cough.  Patient continues to follow-up with pain clinic for his chronic pain management.  Denies any headaches Or vision changes.  REVIEW OF SYSTEMS:  No headaches or vision changes. A complete 10 point review of system is done which is negative for mentioned above in history of present illness.  PAST MEDICAL HISTORY :  Past Medical History:  Diagnosis Date  . Arthritis   . Cancer of upper lobe of  right lung ([place]) 09/19/2015  . Chicken pox   . Chronic pain 09/19/2015  . Current tobacco use 05/26/2014  . Elevated LFTs 09/19/2015  . Fracture of lower extremity 09/19/2015  . Kidney stones   . Lung cancer ([place]) 2015  . OA (osteoarthritis) of knee 05/26/2014  . Status post chemotherapy     PAST SURGICAL HISTORY :   Past Surgical History:  Procedure Laterality Date  . amputation of index finger Right 2010  . HAND SURGERY Right 2010   ORIF of right index finder, 2nd carpometacarpal joint dislocation right hand, proximal right humerus  . ORIF FEMUR FRACTURE Right 2010  . PORT-A-CATH REMOVAL N/A 01/15/2017   Procedure: REMOVAL PORT-A-CATH;  Surgeon: Nestor Lewandowsky, MD;  Location: [facility] ORS;  Service: General;  Laterality: N/A;  . SHOULDER SURGERY Right 2010   pins and rods in shoulder    FAMILY HISTORY :  History reviewed. No pertinent family history.  SOCIAL HISTORY:   Social History   Tobacco Use  . Smoking status: Current Every Day Smoker    Packs/day: 2.00    Years: 20.00    Pack years: 40.00    Types: Cigarettes  . Smokeless tobacco: Never Used  . Tobacco comment: down to 3 cigarettes a month  Substance Use Topics  . Alcohol use: Yes    Alcohol/week: 0.0 oz    Comment: 2-4 Beers Weekly  . Drug use: Yes    Types: Marijuana    Comment: last use 3  days ago    ALLERGIES:  has no active allergies.  MEDICATIONS:  Current Outpatient Medications  Medication Sig Dispense Refill  . Multiple Vitamin (MULTI-VITAMINS) TABS Take 1 tablet by mouth daily.    . nicotine (NICODERM CQ - DOSED IN MG/24 HOURS) 14 mg/24hr patch Place 14 mg onto the skin daily.     Marland Kitchen oxyCODONE (OXYCONTIN) 10 mg 12 hr tablet Take 10 mg by mouth every 12 (twelve) hours.    . predniSONE (DELTASONE) 20 MG tablet Take 1 tablet (20 mg total) by mouth daily with breakfast. Once a day with food x 20 days. 20 tablet 0   No current facility-administered medications for this visit.    Facility-Administered  Medications Ordered in Other Visits  Medication Dose Route Frequency Provider Last Rate Last Dose  . sodium chloride flush (NS) 0.9 % injection 10 mL  10 mL Intracatheter PRN Cammie Sickle, MD   10 mL at 05/26/15 1300  . sodium chloride flush (NS) 0.9 % injection 10 mL  10 mL Intravenous PRN Cammie Sickle, MD   10 mL at 07/07/15 1425    PHYSICAL EXAMINATION: ECOG PERFORMANCE STATUS: 0 - Asymptomatic  BP 124/72 (BP Location: Right Arm, Patient Position: Sitting)   Pulse 70   Temp 97.7 F (36.5 C) (Oral)   Wt 116 lb 3.2 oz (52.7 kg)   SpO2 99%   BMI 17.16 kg/m   Filed Weights   05/03/17 1350  Weight: 116 lb 3.2 oz (52.7 kg)    GENERAL:  Thin built/ moderately nourished; Alert, no distress and comfortable. He is alone.  EYES: no pallor or icterus OROPHARYNX: no thrush or ulceration; poor dentition  NECK: supple, no masses felt LYMPH:  no palpable lymphadenopathy in the cervical, axillary or inguinal regions LUNGS:  Decreased breath sounds bilaterally to auscultation. No wheeze or crackles HEART/CVS: regular rate & rhythm and no murmurs; No lower extremity edema ABDOMEN:abdomen soft, non-tender and normal bowel sounds Musculoskeletal:no cyanosis of digits and no clubbing  PSYCH: alert & oriented x 3 with fluent speech NEURO: no focal motor/sensory deficits SKIN:  no rashes or significant lesions  LABORATORY DATA:  I have reviewed the data as listed    Component Value Date/Time   NA 137 05/03/2017 1315   NA 136 07/27/2014 0856   K 4.8 05/03/2017 1315   K 3.9 07/27/2014 0856   CL 102 05/03/2017 1315   CL 104 07/27/2014 0856   CO2 30 05/03/2017 1315   CO2 27 07/27/2014 0856   GLUCOSE 102 (H) 05/03/2017 1315   GLUCOSE 151 (H) 07/27/2014 0856   BUN 11 05/03/2017 1315   BUN 10 07/27/2014 0856   CREATININE 1.03 05/03/2017 1315   CREATININE 0.86 07/27/2014 0856   CREATININE 0.77 07/06/2014   CALCIUM 8.8 (L) 05/03/2017 1315   CALCIUM 8.6 (L) 07/27/2014 0856    PROT 7.7 05/03/2017 1315   PROT 8.8 (H) 06/28/2014 1512   ALBUMIN 3.5 05/03/2017 1315   ALBUMIN 4.0 06/28/2014 1512   AST 25 05/03/2017 1315   AST 37 06/28/2014 1512   ALT 12 (L) 05/03/2017 1315   ALT 43 06/28/2014 1512   ALKPHOS 34 (L) 05/03/2017 1315   ALKPHOS 53 06/28/2014 1512   BILITOT 0.5 05/03/2017 1315   BILITOT 0.3 06/28/2014 1512   GFRNONAA >60 05/03/2017 1315   GFRNONAA >60 07/27/2014 0856   GFRAA >60 05/03/2017 1315   GFRAA >60 07/27/2014 0856    No results found for: SPEP, UPEP  Lab Results  Component Value Date   WBC 5.1 05/03/2017   NEUTROABS 1.9 05/03/2017   HGB 12.5 (L) 05/03/2017   HCT 38.6 (L) 05/03/2017   MCV 89.4 05/03/2017   PLT 217 05/03/2017      Chemistry      Component Value Date/Time   NA 137 05/03/2017 1315   NA 136 07/27/2014 0856   K 4.8 05/03/2017 1315   K 3.9 07/27/2014 0856   CL 102 05/03/2017 1315   CL 104 07/27/2014 0856   CO2 30 05/03/2017 1315   CO2 27 07/27/2014 0856   BUN 11 05/03/2017 1315   BUN 10 07/27/2014 0856   CREATININE 1.03 05/03/2017 1315   CREATININE 0.86 07/27/2014 0856   CREATININE 0.77 07/06/2014      Component Value Date/Time   CALCIUM 8.8 (L) 05/03/2017 1315   CALCIUM 8.6 (L) 07/27/2014 0856   ALKPHOS 34 (L) 05/03/2017 1315   ALKPHOS 53 06/28/2014 1512   AST 25 05/03/2017 1315   AST 37 06/28/2014 1512   ALT 12 (L) 05/03/2017 1315   ALT 43 06/28/2014 1512   BILITOT 0.5 05/03/2017 1315   BILITOT 0.3 06/28/2014 1512     IMPRESSION: 1. Post treatment changes in the right upper lobe and an ovoid nodular lesion along the right major fissure are unchanged. 2. Aortic atherosclerosis (ICD10-170.0). Moderate to severe coronary artery calcification. 3.  Emphysema (ICD10-J43.9).   Electronically Signed   By: Lorin Picket M.D.   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   #  RUL pleural based nodule/ RUL lesion ~17 mm. S/p SBRT [june 21st 2018]; CT scan 10/29/2016 shows stable lung nodule; no new lung lesions.   #  LFT elevation/hepC- AST/ALT- 3-5 times the upper limit; from hep C- resolved; on treatment. Not from immunotherapy.   # Chronic pain- unrelated to cancer follow up with pain clinic  # weight loss- pred  # smoking- cessation program  # follow up in 3 months/ labs; CT prior.      Cammie Sickle, MD 05/05/2017 7:45 PM

## 2017-07-29 ENCOUNTER — Ambulatory Visit: Admission: RE | Admit: 2017-07-29 | Payer: BLUE CROSS/BLUE SHIELD | Source: Ambulatory Visit

## 2017-08-01 ENCOUNTER — Ambulatory Visit: Admission: RE | Admit: 2017-08-01 | Payer: BLUE CROSS/BLUE SHIELD | Source: Ambulatory Visit

## 2017-08-02 ENCOUNTER — Other Ambulatory Visit: Payer: BLUE CROSS/BLUE SHIELD

## 2017-08-02 ENCOUNTER — Ambulatory Visit: Payer: BLUE CROSS/BLUE SHIELD | Admitting: Internal Medicine

## 2017-08-08 ENCOUNTER — Ambulatory Visit
Admission: RE | Admit: 2017-08-08 | Discharge: 2017-08-08 | Disposition: A | Discharge status: Home / Self Care | Payer: BLUE CROSS/BLUE SHIELD | Source: Ambulatory Visit | Attending: Internal Medicine | Admitting: Internal Medicine

## 2017-08-08 DIAGNOSIS — I700 Atherosclerosis of aorta: Secondary | ICD-10-CM | POA: Diagnosis not present

## 2017-08-08 DIAGNOSIS — C3411 Malignant neoplasm of upper lobe, right bronchus or lung: Secondary | ICD-10-CM | POA: Diagnosis not present

## 2017-08-08 DIAGNOSIS — J439 Emphysema, unspecified: Secondary | ICD-10-CM | POA: Diagnosis not present

## 2017-08-08 DIAGNOSIS — I2510 Atherosclerotic heart disease of native coronary artery without angina pectoris: Secondary | ICD-10-CM | POA: Insufficient documentation

## 2017-08-08 MED ORDER — IOPAMIDOL (ISOVUE-300) INJECTION 61%
75.0000 mL | Freq: Once | INTRAVENOUS | Status: AC | PRN
  Administered 2017-08-08: 75 mL via INTRAVENOUS

## 2017-08-14 ENCOUNTER — Ambulatory Visit: Payer: BLUE CROSS/BLUE SHIELD | Admitting: Internal Medicine

## 2017-08-14 ENCOUNTER — Other Ambulatory Visit: Payer: BLUE CROSS/BLUE SHIELD

## 2017-08-23 ENCOUNTER — Inpatient Hospital Stay: Payer: BLUE CROSS/BLUE SHIELD | Attending: Internal Medicine

## 2017-08-23 ENCOUNTER — Encounter: Payer: Self-pay | Admitting: Internal Medicine

## 2017-08-23 ENCOUNTER — Other Ambulatory Visit: Payer: Self-pay

## 2017-08-23 ENCOUNTER — Inpatient Hospital Stay (HOSPITAL_BASED_OUTPATIENT_CLINIC_OR_DEPARTMENT_OTHER): Payer: BLUE CROSS/BLUE SHIELD | Admitting: Internal Medicine

## 2017-08-23 VITALS — BP 126/80 | HR 64 | Temp 97.9°F | Resp 20 | Ht 69.0 in | Wt 114.0 lb

## 2017-08-23 DIAGNOSIS — C3411 Malignant neoplasm of upper lobe, right bronchus or lung: Secondary | ICD-10-CM | POA: Diagnosis not present

## 2017-08-23 DIAGNOSIS — G8929 Other chronic pain: Secondary | ICD-10-CM

## 2017-08-23 DIAGNOSIS — F17210 Nicotine dependence, cigarettes, uncomplicated: Secondary | ICD-10-CM | POA: Diagnosis not present

## 2017-08-23 DIAGNOSIS — R0602 Shortness of breath: Secondary | ICD-10-CM | POA: Insufficient documentation

## 2017-08-23 LAB — COMPREHENSIVE METABOLIC PANEL
ALBUMIN: 3.8 g/dL (ref 3.5–5.0)
ALK PHOS: 31 U/L — AB (ref 38–126)
ALT: 13 U/L — ABNORMAL LOW (ref 17–63)
ANION GAP: 8 (ref 5–15)
AST: 25 U/L (ref 15–41)
BUN: 12 mg/dL (ref 6–20)
CO2: 27 mmol/L (ref 22–32)
Calcium: 9.3 mg/dL (ref 8.9–10.3)
Chloride: 104 mmol/L (ref 101–111)
Creatinine, Ser: 1.12 mg/dL (ref 0.61–1.24)
GFR calc Af Amer: >60 mL/min (ref >60)
GFR calc non Af Amer: >60 mL/min (ref >60)
GLUCOSE: 104 mg/dL — AB (ref 65–99)
Potassium: 4.7 mmol/L (ref 3.5–5.1)
SODIUM: 139 mmol/L (ref 135–145)
Total Bilirubin: 0.5 mg/dL (ref 0.3–1.2)
Total Protein: 7.5 g/dL (ref 6.5–8.1)

## 2017-08-23 LAB — CBC WITH DIFFERENTIAL/PLATELET
BASOS PCT: 1 %
Basophils Absolute: 0 10*3/uL (ref 0–0.1)
Eosinophils Absolute: 0.1 10*3/uL (ref 0–0.7)
Eosinophils Relative: 3 %
HEMATOCRIT: 37.2 % — AB (ref 40.0–52.0)
Hemoglobin: 12.5 g/dL — ABNORMAL LOW (ref 13.0–18.0)
LYMPHS PCT: 46 %
Lymphs Abs: 2.2 10*3/uL (ref 1.0–3.6)
MCH: 29.9 pg (ref 26.0–34.0)
MCHC: 33.6 g/dL (ref 32.0–36.0)
MCV: 89 fL (ref 80.0–100.0)
MONO ABS: 0.6 10*3/uL (ref 0.2–1.0)
MONOS PCT: 13 %
NEUTROS ABS: 1.7 10*3/uL (ref 1.4–6.5)
Neutrophils Relative %: 37 %
Platelets: 211 10*3/uL (ref 150–440)
RBC: 4.18 MIL/uL — ABNORMAL LOW (ref 4.40–5.90)
RDW: 13.3 % (ref 11.5–14.5)
WBC: 4.7 10*3/uL (ref 3.8–10.6)

## 2017-08-23 NOTE — Progress Notes (Signed)
Patient here for ct scan results. Patient c/o decreased appetite.

## 2017-08-23 NOTE — Progress Notes (Signed)
[place] OFFICE PROGRESS NOTE  Patient Care Team: Tracie Harrier, MD as PCP - General (Internal Medicine) Mohammed Kindle, MD as Attending Physician (Pain Medicine)  Cancer Staging No matching staging information was found for the patient.   Oncology History   # MARCH 2016- LUNG CA- RUL- ADENO CA [mol testing-NA sec to poor samples] STAGE III s/p chemo-RT; [SEP 2016-CT Bx for mol testing; neg for malignancy]; CT-29th  NOV 2016-  Stable/ improved right upper lobe lesion;   # RECURRENT- CT JAN 2017- Stable right upper lobe lung nodule ~ 2.5cm; but progressive RML ~1.8cm; Start Tecentriq q 3W x3 ; March 30th CT 2017- Improved Lung Lesions; RML- 1.6x1.0; RUL ~3cm.; AUG 2nd 2017-CT stable.   # April/May 2018- RUL- local Progression on CT/PET- referral to rad-Onc  # July 2017 Elevated LFTs- likely from Hepatitis C [resolved on treatment- July 2018]; NOT from Tecentriq.  # Hep C [june 2018; treatment at [facility]]  # Chronic pain- on oxycodone.jan 2017-Bone scan- neg.  ---------------------------------------------------------------------------------    DIAGNOSIS: [ ]  RECURRENT ADENO CA LUNG  STAGE:  III   ; GOALS: CONTROL  CURRENT/MOST RECENT THERAPY [ ]  surveillance; last tecentrriq         Cancer of upper lobe of right lung ([facility])      INTERVAL HISTORY:  Mario [name] 56 y.o.  male pleasant patient above history of recurrent stage III adenocarcinoma the lung is here for follow-up/reviewed the recent CT scan.  Has chronic mild cough chronic mild shortness of breath on exertion.  Otherwise no wheezing.  Denies any chest pain or swelling of the legs.  Complains of poor appetite.  No weight loss.  Review of Systems  Constitutional: Negative for chills, diaphoresis, fever, malaise/fatigue and weight loss.  HENT: Negative for nosebleeds and sore throat.   Eyes: Negative for double vision.  Respiratory: Positive for cough and shortness of breath. Negative  for hemoptysis, sputum production and wheezing.   Cardiovascular: Negative for chest pain, palpitations, orthopnea and leg swelling.  Gastrointestinal: Negative for abdominal pain, blood in stool, constipation, diarrhea, heartburn, melena, nausea and vomiting.  Genitourinary: Negative for dysuria, frequency and urgency.  Musculoskeletal: Negative for back pain and joint pain.  Skin: Negative.  Negative for itching and rash.  Neurological: Negative for dizziness, tingling, focal weakness, weakness and headaches.  Endo/Heme/Allergies: Does not bruise/bleed easily.  Psychiatric/Behavioral: Negative for depression. The patient is not nervous/anxious and does not have insomnia.       PAST MEDICAL HISTORY :  Past Medical History:  Diagnosis Date  . Arthritis   . Cancer of upper lobe of right lung ([place]) 09/19/2015  . Chicken pox   . Chronic pain 09/19/2015  . Current tobacco use 05/26/2014  . Elevated LFTs 09/19/2015  . Fracture of lower extremity 09/19/2015  . Kidney stones   . Lung cancer ([place]) 2015  . OA (osteoarthritis) of knee 05/26/2014  . Status post chemotherapy     PAST SURGICAL HISTORY :   Past Surgical History:  Procedure Laterality Date  . amputation of index finger Right 2010  . HAND SURGERY Right 2010   ORIF of right index finder, 2nd carpometacarpal joint dislocation right hand, proximal right humerus  . ORIF FEMUR FRACTURE Right 2010  . PORT-A-CATH REMOVAL N/A 01/15/2017   Procedure: REMOVAL PORT-A-CATH;  Surgeon: Nestor Lewandowsky, MD;  Location: [facility] ORS;  Service: General;  Laterality: N/A;  . SHOULDER SURGERY Right 2010   pins and rods in shoulder  FAMILY HISTORY :  History reviewed. No pertinent family history.  SOCIAL HISTORY:   Social History   Tobacco Use  . Smoking status: Current Every Day Smoker    Packs/day: 2.00    Years: 20.00    Pack years: 40.00    Types: Cigarettes  . Smokeless tobacco: Never Used  . Tobacco comment: down to 3 cigarettes a month   Substance Use Topics  . Alcohol use: Yes    Alcohol/week: 0.0 oz    Comment: 2-4 Beers Weekly  . Drug use: Yes    Types: Marijuana    Comment: last use 3 days ago    ALLERGIES:  has no active allergies.  MEDICATIONS:  Current Outpatient Medications  Medication Sig Dispense Refill  . Multiple Vitamin (MULTI-VITAMINS) TABS Take 1 tablet by mouth daily.    . nicotine (NICODERM CQ - DOSED IN MG/24 HOURS) 14 mg/24hr patch Place 14 mg onto the skin daily.     Marland Kitchen oxyCODONE (OXYCONTIN) 10 mg 12 hr tablet Take 10 mg by mouth every 12 (twelve) hours.    . predniSONE (DELTASONE) 20 MG tablet Take 1 tablet (20 mg total) by mouth daily with breakfast. Once a day with food x 20 days. 20 tablet 0   No current facility-administered medications for this visit.    Facility-Administered Medications Ordered in Other Visits  Medication Dose Route Frequency Provider Last Rate Last Dose  . sodium chloride flush (NS) 0.9 % injection 10 mL  10 mL Intracatheter PRN Cammie Sickle, MD   10 mL at 05/26/15 1300  . sodium chloride flush (NS) 0.9 % injection 10 mL  10 mL Intravenous PRN Cammie Sickle, MD   10 mL at 07/07/15 1425    PHYSICAL EXAMINATION: ECOG PERFORMANCE STATUS: 0 - Asymptomatic  BP 126/80 (Patient Position: Sitting)   Pulse 64   Temp 97.9 F (36.6 C) (Tympanic)   Resp 20   Ht 5\' 9"  (1.753 m)   Wt 114 lb (51.7 kg)   BMI 16.83 kg/m   Filed Weights   08/23/17 1423  Weight: 114 lb (51.7 kg)    GENERAL: Well-nourished well-developed; Alert, no distress and comfortable.  Alone. EYES: no pallor or icterus OROPHARYNX: no thrush or ulceration; NECK: supple; no lymph nodes felt. LYMPH:  no palpable lymphadenopathy in the axillary or inguinal regions LUNGS: Decreased breath sounds auscultation bilaterally. No wheeze or crackles HEART/CVS: regular rate & rhythm and no murmurs; No lower extremity edema ABDOMEN:abdomen soft, non-tender and normal bowel sounds. No hepatomegaly  or splenomegaly.  Musculoskeletal:no cyanosis of digits and no clubbing  PSYCH: alert & oriented x 3 with fluent speech NEURO: no focal motor/sensory deficits SKIN:  no rashes or significant lesions    LABORATORY DATA:  I have reviewed the data as listed    Component Value Date/Time   NA 139 08/23/2017 1337   NA 136 07/27/2014 0856   K 4.7 08/23/2017 1337   K 3.9 07/27/2014 0856   CL 104 08/23/2017 1337   CL 104 07/27/2014 0856   CO2 27 08/23/2017 1337   CO2 27 07/27/2014 0856   GLUCOSE 104 (H) 08/23/2017 1337   GLUCOSE 151 (H) 07/27/2014 0856   BUN 12 08/23/2017 1337   BUN 10 07/27/2014 0856   CREATININE 1.12 08/23/2017 1337   CREATININE 0.86 07/27/2014 0856   CREATININE 0.77 07/06/2014   CALCIUM 9.3 08/23/2017 1337   CALCIUM 8.6 (L) 07/27/2014 0856   PROT 7.5 08/23/2017 1337   PROT 8.8 (H)  06/28/2014 1512   ALBUMIN 3.8 08/23/2017 1337   ALBUMIN 4.0 06/28/2014 1512   AST 25 08/23/2017 1337   AST 37 06/28/2014 1512   ALT 13 (L) 08/23/2017 1337   ALT 43 06/28/2014 1512   ALKPHOS 31 (L) 08/23/2017 1337   ALKPHOS 53 06/28/2014 1512   BILITOT 0.5 08/23/2017 1337   BILITOT 0.3 06/28/2014 1512   GFRNONAA >60 08/23/2017 1337   GFRNONAA >60 07/27/2014 0856   GFRAA >60 08/23/2017 1337   GFRAA >60 07/27/2014 0856    No results found for: SPEP, UPEP  Lab Results  Component Value Date   WBC 4.7 08/23/2017   NEUTROABS 1.7 08/23/2017   HGB 12.5 (L) 08/23/2017   HCT 37.2 (L) 08/23/2017   MCV 89.0 08/23/2017   PLT 211 08/23/2017      Chemistry      Component Value Date/Time   NA 139 08/23/2017 1337   NA 136 07/27/2014 0856   K 4.7 08/23/2017 1337   K 3.9 07/27/2014 0856   CL 104 08/23/2017 1337   CL 104 07/27/2014 0856   CO2 27 08/23/2017 1337   CO2 27 07/27/2014 0856   BUN 12 08/23/2017 1337   BUN 10 07/27/2014 0856   CREATININE 1.12 08/23/2017 1337   CREATININE 0.86 07/27/2014 0856   CREATININE 0.77 07/06/2014      Component Value Date/Time   CALCIUM 9.3  08/23/2017 1337   CALCIUM 8.6 (L) 07/27/2014 0856   ALKPHOS 31 (L) 08/23/2017 1337   ALKPHOS 53 06/28/2014 1512   AST 25 08/23/2017 1337   AST 37 06/28/2014 1512   ALT 13 (L) 08/23/2017 1337   ALT 43 06/28/2014 1512   BILITOT 0.5 08/23/2017 1337   BILITOT 0.3 06/28/2014 1512       RADIOGRAPHIC STUDIES: I have personally reviewed the radiological images as listed and agreed with the findings in the report. No results found.   ASSESSMENT & PLAN:  Cancer of upper lobe of right lung [facility]) #Recurrent adenocarcinoma of the lung-right upper lobe/status post right upper lobe nodule SBRT June 2018.  Question worsening-based on recent may 2019 CT scan.  Recommend a PET scan for further evaluation.  #Hepatitis C/status post treatment; resolved.  # Chronic pain- unrelated to cancer follow up with pain clinic  #Chronic shortness of breath/COPD-stable.   #Active smoker-recommend quitting.  Patient not interested.  # follow up TBD- base don PET scan.  # I reviewed the blood work- with the patient in detail; also reviewed the imaging independently [as summarized above]; and with the patient in detail.     Orders Placed This Encounter  Procedures  . NM PET Image Restag (PS) Skull Base To Thigh    Standing Status:   Future    Standing Expiration Date:   08/23/2018    Order Specific Question:   If indicated for the ordered procedure, I authorize the administration of a radiopharmaceutical per Radiology protocol    Answer:   Yes    Order Specific Question:   Preferred imaging location?    Answer:   [facility]    Order Specific Question:   Radiology Contrast Protocol - do NOT remove file path    Answer:   [email or web address]   All questions were answered. The patient knows to call the clinic with any problems, questions or concerns.      Cammie Sickle, MD 08/23/2017 5:37 PM

## 2017-08-23 NOTE — Assessment & Plan Note (Addendum)
#  Recurrent adenocarcinoma of the lung-right upper lobe/status post right upper lobe nodule SBRT June 2018.  Question worsening-based on recent may 2019 CT scan.  Recommend a PET scan for further evaluation.  #Hepatitis C/status post treatment; resolved.  # Chronic pain- unrelated to cancer follow up with pain clinic  #Chronic shortness of breath/COPD-stable.   #Active smoker-recommend quitting.  Patient not interested.  # follow up TBD- base don PET scan.  We will reviewed the tumor conference.  # I reviewed the blood work- with the patient in detail; also reviewed the imaging independently [as summarized above]; and with the patient in detail.

## 2017-08-29 ENCOUNTER — Telehealth: Payer: Self-pay | Admitting: Internal Medicine

## 2017-08-29 ENCOUNTER — Inpatient Hospital Stay: Payer: BLUE CROSS/BLUE SHIELD

## 2017-08-29 ENCOUNTER — Encounter
Admission: RE | Admit: 2017-08-29 | Discharge: 2017-08-29 | Disposition: A | Discharge status: Home / Self Care | Payer: BLUE CROSS/BLUE SHIELD | Source: Ambulatory Visit | Attending: Internal Medicine | Admitting: Internal Medicine

## 2017-08-29 DIAGNOSIS — C3411 Malignant neoplasm of upper lobe, right bronchus or lung: Secondary | ICD-10-CM

## 2017-08-29 LAB — GLUCOSE, CAPILLARY: Glucose-Capillary: 93 mg/dL (ref 65–99)

## 2017-08-29 MED ORDER — FLUDEOXYGLUCOSE F - 18 (FDG) INJECTION
5.5400 | Freq: Once | INTRAVENOUS | Status: AC | PRN
  Administered 2017-08-29: 5.54 via INTRAVENOUS

## 2017-08-29 NOTE — Telephone Encounter (Signed)
Mario [name]- Please inform patient based on PET scan no evidence of recurrence noted at this time; follow-up recommended in approximately 4 months CT scan prior/labs- cbc/cmp. Thx

## 2017-08-29 NOTE — Telephone Encounter (Signed)
x

## 2017-08-30 NOTE — Telephone Encounter (Signed)
Attempted to call pt to give recommendations. Pt did not answer and unable to leave voicemail. Will try again later.

## 2017-09-04 NOTE — Telephone Encounter (Signed)
Attempted to call pt for second time without success. Unable to leave message. Will mail appointment reminder with MD recommendations.

## 2017-10-04 ENCOUNTER — Other Ambulatory Visit: Payer: Self-pay | Admitting: Internal Medicine

## 2017-10-04 DIAGNOSIS — R52 Pain, unspecified: Secondary | ICD-10-CM

## 2017-10-04 DIAGNOSIS — M79601 Pain in right arm: Secondary | ICD-10-CM

## 2017-10-04 DIAGNOSIS — M79602 Pain in left arm: Secondary | ICD-10-CM

## 2017-10-04 DIAGNOSIS — R29898 Other symptoms and signs involving the musculoskeletal system: Secondary | ICD-10-CM

## 2017-12-25 ENCOUNTER — Ambulatory Visit: Admission: RE | Admit: 2017-12-25 | Payer: BLUE CROSS/BLUE SHIELD | Source: Ambulatory Visit

## 2017-12-26 ENCOUNTER — Other Ambulatory Visit: Payer: Self-pay | Admitting: Internal Medicine

## 2017-12-26 DIAGNOSIS — C3411 Malignant neoplasm of upper lobe, right bronchus or lung: Secondary | ICD-10-CM

## 2017-12-27 ENCOUNTER — Inpatient Hospital Stay (HOSPITAL_BASED_OUTPATIENT_CLINIC_OR_DEPARTMENT_OTHER): Payer: BLUE CROSS/BLUE SHIELD | Admitting: Internal Medicine

## 2017-12-27 ENCOUNTER — Encounter: Payer: Self-pay | Admitting: Internal Medicine

## 2017-12-27 ENCOUNTER — Inpatient Hospital Stay: Payer: BLUE CROSS/BLUE SHIELD | Attending: Internal Medicine

## 2017-12-27 ENCOUNTER — Other Ambulatory Visit: Payer: Self-pay

## 2017-12-27 VITALS — BP 122/70 | HR 63 | Temp 97.9°F | Resp 20 | Ht 69.0 in | Wt 114.0 lb

## 2017-12-27 DIAGNOSIS — F17210 Nicotine dependence, cigarettes, uncomplicated: Secondary | ICD-10-CM

## 2017-12-27 DIAGNOSIS — Z79899 Other long term (current) drug therapy: Secondary | ICD-10-CM | POA: Diagnosis not present

## 2017-12-27 DIAGNOSIS — C3411 Malignant neoplasm of upper lobe, right bronchus or lung: Secondary | ICD-10-CM

## 2017-12-27 DIAGNOSIS — R05 Cough: Secondary | ICD-10-CM

## 2017-12-27 DIAGNOSIS — G8929 Other chronic pain: Secondary | ICD-10-CM | POA: Diagnosis not present

## 2017-12-27 DIAGNOSIS — Z9221 Personal history of antineoplastic chemotherapy: Secondary | ICD-10-CM | POA: Diagnosis not present

## 2017-12-27 DIAGNOSIS — R0602 Shortness of breath: Secondary | ICD-10-CM | POA: Diagnosis not present

## 2017-12-27 LAB — CBC WITH DIFFERENTIAL/PLATELET
BASOS PCT: 1 %
Basophils Absolute: 0 10*3/uL (ref 0–0.1)
Eosinophils Absolute: 0.1 10*3/uL (ref 0–0.7)
Eosinophils Relative: 3 %
HEMATOCRIT: 37.9 % — AB (ref 40.0–52.0)
HEMOGLOBIN: 12.5 g/dL — AB (ref 13.0–18.0)
LYMPHS ABS: 1.8 10*3/uL (ref 1.0–3.6)
LYMPHS PCT: 40 %
MCH: 29.5 pg (ref 26.0–34.0)
MCHC: 32.9 g/dL (ref 32.0–36.0)
MCV: 89.6 fL (ref 80.0–100.0)
MONO ABS: 0.6 10*3/uL (ref 0.2–1.0)
MONOS PCT: 13 %
NEUTROS ABS: 2 10*3/uL (ref 1.4–6.5)
NEUTROS PCT: 43 %
Platelets: 188 10*3/uL (ref 150–440)
RBC: 4.23 MIL/uL — ABNORMAL LOW (ref 4.40–5.90)
RDW: 12.9 % (ref 11.5–14.5)
WBC: 4.6 10*3/uL (ref 3.8–10.6)

## 2017-12-27 LAB — COMPREHENSIVE METABOLIC PANEL
ALBUMIN: 3.7 g/dL (ref 3.5–5.0)
ALK PHOS: 35 U/L — AB (ref 38–126)
ALT: 13 U/L (ref 0–44)
ANION GAP: 6 (ref 5–15)
AST: 20 U/L (ref 15–41)
BUN: 12 mg/dL (ref 6–20)
CHLORIDE: 105 mmol/L (ref 98–111)
CO2: 27 mmol/L (ref 22–32)
Calcium: 9.2 mg/dL (ref 8.9–10.3)
Creatinine, Ser: 0.9 mg/dL (ref 0.61–1.24)
GFR calc non Af Amer: >60 mL/min (ref >60)
GLUCOSE: 108 mg/dL — AB (ref 70–99)
POTASSIUM: 4.5 mmol/L (ref 3.5–5.1)
SODIUM: 138 mmol/L (ref 135–145)
Total Bilirubin: 0.8 mg/dL (ref 0.3–1.2)
Total Protein: 7.2 g/dL (ref 6.5–8.1)

## 2017-12-27 MED ORDER — ALBUTEROL SULFATE (2.5 MG/3ML) 0.083% IN NEBU: 2.5000 mg | INHALATION_SOLUTION | Freq: Four times a day (QID) | RESPIRATORY_TRACT | 3 refills | Status: AC | PRN

## 2017-12-27 NOTE — Assessment & Plan Note (Addendum)
#  Recurrent adenocarcinoma of the lung-right upper lobe/status post right upper lobe nodule SBRT June 2018.  PET scan may June 2019-stable radiation changes.  #Clinically stable.  Await surveillance CT scan-ordered at this visit.  #Hepatitis C/status post treatment; resolved; STABLE.  Followed at [facility].  # Chronic pain- unrelated to cancer follow up with pain clinic; STABLE.   # Chronic shortness of breath/COPD; worse; refilled albuterol.   #Active smoker-recommend quitting; counseled; currently smkoing 1-2 cig/day.   # follow up in 2 months/labs; CT scan asap. Will call with results.

## 2017-12-27 NOTE — Progress Notes (Signed)
[place] OFFICE PROGRESS NOTE  Patient Care Team: Tracie Harrier, MD as PCP - General (Internal Medicine) Mohammed Kindle, MD as Attending Physician (Pain Medicine)  Cancer Staging No matching staging information was found for the patient.   Oncology History   # MARCH 2016- LUNG CA- RUL- ADENO CA [mol testing-NA sec to poor samples] STAGE III s/p chemo-RT; [SEP 2016-CT Bx for mol testing; neg for malignancy]; CT-29th  NOV 2016-  Stable/ improved right upper lobe lesion;   # RECURRENT- CT JAN 2017- Stable right upper lobe lung nodule ~ 2.5cm; but progressive RML ~1.8cm; Start Tecentriq q 3W x3 ; March 30th CT 2017- Improved Lung Lesions; RML- 1.6x1.0; RUL ~3cm.; AUG 2nd 2017-CT stable.   # April/May 2018- RUL- local Progression on CT/PET- referral to rad-Onc  # July 2017 Elevated LFTs- likely from Hepatitis C [resolved on treatment- July 2018]; NOT from Tecentriq.  # Hep C [june 2018; treatment at [facility]]  # Chronic pain- on oxycodone.jan 2017-Bone scan- neg.  ---------------------------------------------------------------------------------    DIAGNOSIS: [ ]  RECURRENT ADENO CA LUNG  STAGE:  III   ; GOALS: CONTROL  CURRENT/MOST RECENT THERAPY [ ]  surveillance; last tecentrriq         Cancer of upper lobe of right lung (HCC)      INTERVAL HISTORY:  Mario [name] 57 y.o.  male pleasant patient above history of recurrent stage III adenocarcinoma the lung is here for follow-up.  Patient continues to have chronic joint pains back pain.  Not any worse.  Continues to have shortness of breath with exertion.  However noted to have slightly worsening cough.  Patient not using his inhalers as he ran out.  No phlegm.  No fevers.   Review of Systems  Constitutional: Negative for chills, diaphoresis, fever, malaise/fatigue and weight loss.  HENT: Negative for nosebleeds and sore throat.   Eyes: Negative for double vision.  Respiratory: Positive for cough  and shortness of breath. Negative for hemoptysis, sputum production and wheezing.   Cardiovascular: Negative for chest pain, palpitations, orthopnea and leg swelling.  Gastrointestinal: Negative for abdominal pain, blood in stool, constipation, diarrhea, heartburn, melena, nausea and vomiting.  Genitourinary: Negative for dysuria, frequency and urgency.  Musculoskeletal: Negative for back pain and joint pain.  Skin: Negative.  Negative for itching and rash.  Neurological: Negative for dizziness, tingling, focal weakness, weakness and headaches.  Endo/Heme/Allergies: Does not bruise/bleed easily.  Psychiatric/Behavioral: Negative for depression. The patient is not nervous/anxious and does not have insomnia.       PAST MEDICAL HISTORY :  Past Medical History:  Diagnosis Date  . Arthritis   . Cancer of upper lobe of right lung ([place]) 09/19/2015  . Chicken pox   . Chronic pain 09/19/2015  . Current tobacco use 05/26/2014  . Elevated LFTs 09/19/2015  . Fracture of lower extremity 09/19/2015  . Kidney stones   . Lung cancer ([place]) 2015  . OA (osteoarthritis) of knee 05/26/2014  . Status post chemotherapy     PAST SURGICAL HISTORY :   Past Surgical History:  Procedure Laterality Date  . amputation of index finger Right 2010  . HAND SURGERY Right 2010   ORIF of right index finder, 2nd carpometacarpal joint dislocation right hand, proximal right humerus  . ORIF FEMUR FRACTURE Right 2010  . PORT-A-CATH REMOVAL N/A 01/15/2017   Procedure: REMOVAL PORT-A-CATH;  Surgeon: Nestor Lewandowsky, MD;  Location: [facility] ORS;  Service: General;  Laterality: N/A;  . SHOULDER SURGERY Right 2010  pins and rods in shoulder    FAMILY HISTORY :  History reviewed. No pertinent family history.  SOCIAL HISTORY:   Social History   Tobacco Use  . Smoking status: Current Every Day Smoker    Packs/day: 2.00    Years: 20.00    Pack years: 40.00    Types: Cigarettes  . Smokeless tobacco: Never Used  . Tobacco  comment: down to 3 cigarettes a month  Substance Use Topics  . Alcohol use: Yes    Alcohol/week: 0.0 standard drinks    Comment: 2-4 Beers Weekly  . Drug use: Yes    Types: Marijuana    Comment: last use 3 days ago    ALLERGIES:  has no active allergies.  MEDICATIONS:  Current Outpatient Medications  Medication Sig Dispense Refill  . Multiple Vitamin (MULTI-VITAMINS) TABS Take 1 tablet by mouth daily.    . nicotine (NICODERM CQ - DOSED IN MG/24 HOURS) 14 mg/24hr patch Place 14 mg onto the skin daily.     Marland Kitchen oxyCODONE (OXYCONTIN) 10 mg 12 hr tablet Take 10 mg by mouth every 12 (twelve) hours.    Marland Kitchen albuterol (PROVENTIL) (2.5 MG/3ML) 0.083% nebulizer solution Take 3 mLs (2.5 mg total) by nebulization every 6 (six) hours as needed for wheezing or shortness of breath. 75 mL 3   No current facility-administered medications for this visit.    Facility-Administered Medications Ordered in Other Visits  Medication Dose Route Frequency Provider Last Rate Last Dose  . sodium chloride flush (NS) 0.9 % injection 10 mL  10 mL Intracatheter PRN Cammie Sickle, MD   10 mL at 05/26/15 1300  . sodium chloride flush (NS) 0.9 % injection 10 mL  10 mL Intravenous PRN Cammie Sickle, MD   10 mL at 07/07/15 1425    PHYSICAL EXAMINATION: ECOG PERFORMANCE STATUS: 0 - Asymptomatic  BP 122/70   Pulse 63   Temp 97.9 F (36.6 C) (Tympanic)   Resp 20   Ht 5\' 9"  (1.753 m)   Wt 114 lb (51.7 kg)   BMI 16.83 kg/m   Filed Weights   12/27/17 1034  Weight: 114 lb (51.7 kg)    Physical Exam  Constitutional: He is oriented to person, place, and time.  Thin built moderately nourished male patient no acute distress.  Is alone.  HENT:  Head: Normocephalic and atraumatic.  Mouth/Throat: Oropharynx is clear and moist. No oropharyngeal exudate.  Eyes: Pupils are equal, round, and reactive to light.  Neck: Normal range of motion. Neck supple.  Cardiovascular: Normal rate and regular rhythm.   Pulmonary/Chest: No respiratory distress. He has no wheezes.  Decreased breath sounds bil.   Abdominal: Soft. Bowel sounds are normal. He exhibits no distension and no mass. There is no tenderness. There is no rebound and no guarding.  Musculoskeletal: Normal range of motion. He exhibits no edema or tenderness.  Neurological: He is alert and oriented to person, place, and time.  Skin: Skin is warm.  Psychiatric: Affect normal.       LABORATORY DATA:  I have reviewed the data as listed    Component Value Date/Time   NA 138 12/27/2017 1019   NA 136 07/27/2014 0856   K 4.5 12/27/2017 1019   K 3.9 07/27/2014 0856   CL 105 12/27/2017 1019   CL 104 07/27/2014 0856   CO2 27 12/27/2017 1019   CO2 27 07/27/2014 0856   GLUCOSE 108 (H) 12/27/2017 1019   GLUCOSE 151 (H) 07/27/2014 4174  BUN 12 12/27/2017 1019   BUN 10 07/27/2014 0856   CREATININE 0.90 12/27/2017 1019   CREATININE 0.86 07/27/2014 0856   CREATININE 0.77 07/06/2014   CALCIUM 9.2 12/27/2017 1019   CALCIUM 8.6 (L) 07/27/2014 0856   PROT 7.2 12/27/2017 1019   PROT 8.8 (H) 06/28/2014 1512   ALBUMIN 3.7 12/27/2017 1019   ALBUMIN 4.0 06/28/2014 1512   AST 20 12/27/2017 1019   AST 37 06/28/2014 1512   ALT 13 12/27/2017 1019   ALT 43 06/28/2014 1512   ALKPHOS 35 (L) 12/27/2017 1019   ALKPHOS 53 06/28/2014 1512   BILITOT 0.8 12/27/2017 1019   BILITOT 0.3 06/28/2014 1512   GFRNONAA >60 12/27/2017 1019   GFRNONAA >60 07/27/2014 0856   GFRAA >60 12/27/2017 1019   GFRAA >60 07/27/2014 0856    No results found for: SPEP, UPEP  Lab Results  Component Value Date   WBC 4.6 12/27/2017   NEUTROABS 2.0 12/27/2017   HGB 12.5 (L) 12/27/2017   HCT 37.9 (L) 12/27/2017   MCV 89.6 12/27/2017   PLT 188 12/27/2017      Chemistry      Component Value Date/Time   NA 138 12/27/2017 1019   NA 136 07/27/2014 0856   K 4.5 12/27/2017 1019   K 3.9 07/27/2014 0856   CL 105 12/27/2017 1019   CL 104 07/27/2014 0856   CO2 27  12/27/2017 1019   CO2 27 07/27/2014 0856   BUN 12 12/27/2017 1019   BUN 10 07/27/2014 0856   CREATININE 0.90 12/27/2017 1019   CREATININE 0.86 07/27/2014 0856   CREATININE 0.77 07/06/2014      Component Value Date/Time   CALCIUM 9.2 12/27/2017 1019   CALCIUM 8.6 (L) 07/27/2014 0856   ALKPHOS 35 (L) 12/27/2017 1019   ALKPHOS 53 06/28/2014 1512   AST 20 12/27/2017 1019   AST 37 06/28/2014 1512   ALT 13 12/27/2017 1019   ALT 43 06/28/2014 1512   BILITOT 0.8 12/27/2017 1019   BILITOT 0.3 06/28/2014 1512       RADIOGRAPHIC STUDIES: I have personally reviewed the radiological images as listed and agreed with the findings in the report. No results found.   ASSESSMENT & PLAN:  Cancer of upper lobe of right lung [facility]) #Recurrent adenocarcinoma of the lung-right upper lobe/status post right upper lobe nodule SBRT June 2018.  PET scan may June 2019-stable radiation changes.  #Clinically stable.  Await surveillance CT scan-ordered at this visit.  #Hepatitis C/status post treatment; resolved; STABLE.  Followed at [facility].  # Chronic pain- unrelated to cancer follow up with pain clinic; STABLE.   # Chronic shortness of breath/COPD; worse; refilled albuterol.   #Active smoker-recommend quitting; counseled; currently smkoing 1-2 cig/day.   # follow up in 2 months/labs; CT scan asap. Will call with results.      No orders of the defined types were placed in this encounter.  All questions were answered. The patient knows to call the clinic with any problems, questions or concerns.      Cammie Sickle, MD 12/27/2017 12:14 PM

## 2017-12-27 NOTE — Progress Notes (Signed)
Patient no showed for his ct scan this week.

## 2018-01-07 ENCOUNTER — Ambulatory Visit: Admission: RE | Admit: 2018-01-07 | Payer: BLUE CROSS/BLUE SHIELD | Source: Ambulatory Visit

## 2018-01-09 ENCOUNTER — Other Ambulatory Visit: Payer: Self-pay | Admitting: Internal Medicine

## 2018-01-09 DIAGNOSIS — C3411 Malignant neoplasm of upper lobe, right bronchus or lung: Secondary | ICD-10-CM

## 2018-01-10 ENCOUNTER — Inpatient Hospital Stay: Payer: BLUE CROSS/BLUE SHIELD

## 2018-01-10 ENCOUNTER — Inpatient Hospital Stay: Payer: BLUE CROSS/BLUE SHIELD | Admitting: Internal Medicine

## 2018-01-27 ENCOUNTER — Ambulatory Visit
Admission: RE | Admit: 2018-01-27 | Discharge: 2018-01-27 | Disposition: A | Discharge status: Home / Self Care | Payer: BLUE CROSS/BLUE SHIELD | Source: Ambulatory Visit | Attending: Internal Medicine | Admitting: Internal Medicine

## 2018-01-27 DIAGNOSIS — J439 Emphysema, unspecified: Secondary | ICD-10-CM | POA: Insufficient documentation

## 2018-01-27 DIAGNOSIS — I700 Atherosclerosis of aorta: Secondary | ICD-10-CM | POA: Insufficient documentation

## 2018-01-27 DIAGNOSIS — C3411 Malignant neoplasm of upper lobe, right bronchus or lung: Secondary | ICD-10-CM | POA: Insufficient documentation

## 2018-01-27 MED ORDER — IOPAMIDOL (ISOVUE-300) INJECTION 61%
75.0000 mL | Freq: Once | INTRAVENOUS | Status: AC | PRN
  Administered 2018-01-27: 75 mL via INTRAVENOUS

## 2018-02-21 ENCOUNTER — Inpatient Hospital Stay: Payer: BLUE CROSS/BLUE SHIELD | Admitting: Internal Medicine

## 2018-02-21 ENCOUNTER — Inpatient Hospital Stay: Payer: BLUE CROSS/BLUE SHIELD

## 2018-02-21 NOTE — Assessment & Plan Note (Deleted)
#  Recurrent adenocarcinoma of the lung-right upper lobe/status post right upper lobe nodule SBRT June 2018.  PET scan may June 2019-stable radiation changes.  #Clinically stable.  Await surveillance CT scan-ordered at this visit.  #Hepatitis C/status post treatment; resolved; STABLE.  Followed at [facility].  # Chronic pain- unrelated to cancer follow up with pain clinic; STABLE.   # Chronic shortness of breath/COPD; worse; refilled albuterol.   #Active smoker-recommend quitting; counseled; currently smkoing 1-2 cig/day.   # follow up in 2 months/labs; CT scan asap. Will call with results.

## 2018-02-21 NOTE — Progress Notes (Deleted)
[name] OFFICE PROGRESS NOTE  Patient Care Team: Tracie Harrier, MD as PCP - General (Internal Medicine) Mohammed Kindle, MD as Attending Physician (Pain Medicine)  Cancer Staging No matching staging information was found for the patient.   Oncology History   # MARCH 2016- LUNG CA- RUL- ADENO CA [mol testing-NA sec to poor samples] STAGE III s/p chemo-RT; [SEP 2016-CT Bx for mol testing; neg for malignancy]; CT-29th  NOV 2016-  Stable/ improved right upper lobe lesion;   # RECURRENT- CT JAN 2017- Stable right upper lobe lung nodule ~ 2.5cm; but progressive RML ~1.8cm; Start Tecentriq q 3W x3 ; March 30th CT 2017- Improved Lung Lesions; RML- 1.6x1.0; RUL ~3cm.; AUG 2nd 2017-CT stable.   # April/May 2018- RUL- local Progression on CT/PET- referral to rad-Onc  # July 2017 Elevated LFTs- likely from Hepatitis C [resolved on treatment- July 2018]; NOT from Tecentriq.  # Hep C [june 2018; treatment at [facility]]  # Chronic pain- on oxycodone.jan 2017-Bone scan- neg.  ---------------------------------------------------------------------------------    DIAGNOSIS: [ ]  RECURRENT ADENO CA LUNG  STAGE:  III   ; GOALS: CONTROL  CURRENT/MOST RECENT THERAPY [ ]  surveillance; last tecentrriq         Cancer of upper lobe of right lung (HCC)      INTERVAL HISTORY:  Mario [name] 57 y.o.  male pleasant patient above history of recurrent stage III adenocarcinoma the lung is here for follow-up.  Patient continues to have chronic joint pains back pain.  Not any worse.  Continues to have shortness of breath with exertion.  However noted to have slightly worsening cough.  Patient not using his inhalers as he ran out.  No phlegm.  No fevers.   Review of Systems  Constitutional: Negative for chills, diaphoresis, fever, malaise/fatigue and weight loss.  HENT: Negative for nosebleeds and sore throat.   Eyes: Negative for double vision.  Respiratory: Positive for cough  and shortness of breath. Negative for hemoptysis, sputum production and wheezing.   Cardiovascular: Negative for chest pain, palpitations, orthopnea and leg swelling.  Gastrointestinal: Negative for abdominal pain, blood in stool, constipation, diarrhea, heartburn, melena, nausea and vomiting.  Genitourinary: Negative for dysuria, frequency and urgency.  Musculoskeletal: Negative for back pain and joint pain.  Skin: Negative.  Negative for itching and rash.  Neurological: Negative for dizziness, tingling, focal weakness, weakness and headaches.  Endo/Heme/Allergies: Does not bruise/bleed easily.  Psychiatric/Behavioral: Negative for depression. The patient is not nervous/anxious and does not have insomnia.       PAST MEDICAL HISTORY :  Past Medical History:  Diagnosis Date  . Arthritis   . Cancer of upper lobe of right lung ([place]) 09/19/2015  . Chicken pox   . Chronic pain 09/19/2015  . Current tobacco use 05/26/2014  . Elevated LFTs 09/19/2015  . Fracture of lower extremity 09/19/2015  . Kidney stones   . Lung cancer ([place]) 2015  . OA (osteoarthritis) of knee 05/26/2014  . Status post chemotherapy     PAST SURGICAL HISTORY :   Past Surgical History:  Procedure Laterality Date  . amputation of index finger Right 2010  . HAND SURGERY Right 2010   ORIF of right index finder, 2nd carpometacarpal joint dislocation right hand, proximal right humerus  . ORIF FEMUR FRACTURE Right 2010  . PORT-A-CATH REMOVAL N/A 01/15/2017   Procedure: REMOVAL PORT-A-CATH;  Surgeon: Nestor Lewandowsky, MD;  Location: [facility] ORS;  Service: General;  Laterality: N/A;  . SHOULDER SURGERY Right 2010  pins and rods in shoulder    FAMILY HISTORY :  No family history on file.  SOCIAL HISTORY:   Social History   Tobacco Use  . Smoking status: Current Every Day Smoker    Packs/day: 2.00    Years: 20.00    Pack years: 40.00    Types: Cigarettes  . Smokeless tobacco: Never Used  . Tobacco comment: down to 3  cigarettes a month  Substance Use Topics  . Alcohol use: Yes    Alcohol/week: 0.0 standard drinks    Comment: 2-4 Beers Weekly  . Drug use: Yes    Types: Marijuana    Comment: last use 3 days ago    ALLERGIES:  has No Known Allergies.  MEDICATIONS:  Current Outpatient Medications  Medication Sig Dispense Refill  . albuterol (PROVENTIL) (2.5 MG/3ML) 0.083% nebulizer solution Take 3 mLs (2.5 mg total) by nebulization every 6 (six) hours as needed for wheezing or shortness of breath. 75 mL 3  . Multiple Vitamin (MULTI-VITAMINS) TABS Take 1 tablet by mouth daily.    . nicotine (NICODERM CQ - DOSED IN MG/24 HOURS) 14 mg/24hr patch Place 14 mg onto the skin daily.     Marland Kitchen oxyCODONE (OXYCONTIN) 10 mg 12 hr tablet Take 10 mg by mouth every 12 (twelve) hours.     No current facility-administered medications for this visit.    Facility-Administered Medications Ordered in Other Visits  Medication Dose Route Frequency Provider Last Rate Last Dose  . sodium chloride flush (NS) 0.9 % injection 10 mL  10 mL Intracatheter PRN Cammie Sickle, MD   10 mL at 05/26/15 1300  . sodium chloride flush (NS) 0.9 % injection 10 mL  10 mL Intravenous PRN Cammie Sickle, MD   10 mL at 07/07/15 1425    PHYSICAL EXAMINATION: ECOG PERFORMANCE STATUS: 0 - Asymptomatic  There were no vitals taken for this visit.  There were no vitals filed for this visit.  Physical Exam  Constitutional: He is oriented to person, place, and time.  Thin built moderately nourished male patient no acute distress.  Is alone.  HENT:  Head: Normocephalic and atraumatic.  Mouth/Throat: Oropharynx is clear and moist. No oropharyngeal exudate.  Eyes: Pupils are equal, round, and reactive to light.  Neck: Normal range of motion. Neck supple.  Cardiovascular: Normal rate and regular rhythm.  Pulmonary/Chest: No respiratory distress. He has no wheezes.  Decreased breath sounds bil.   Abdominal: Soft. Bowel sounds are  normal. He exhibits no distension and no mass. There is no tenderness. There is no rebound and no guarding.  Musculoskeletal: Normal range of motion. He exhibits no edema or tenderness.  Neurological: He is alert and oriented to person, place, and time.  Skin: Skin is warm.  Psychiatric: Affect normal.       LABORATORY DATA:  I have reviewed the data as listed    Component Value Date/Time   NA 138 12/27/2017 1019   NA 136 07/27/2014 0856   K 4.5 12/27/2017 1019   K 3.9 07/27/2014 0856   CL 105 12/27/2017 1019   CL 104 07/27/2014 0856   CO2 27 12/27/2017 1019   CO2 27 07/27/2014 0856   GLUCOSE 108 (H) 12/27/2017 1019   GLUCOSE 151 (H) 07/27/2014 0856   BUN 12 12/27/2017 1019   BUN 10 07/27/2014 0856   CREATININE 0.90 12/27/2017 1019   CREATININE 0.86 07/27/2014 0856   CREATININE 0.77 07/06/2014   CALCIUM 9.2 12/27/2017 1019   CALCIUM  8.6 (L) 07/27/2014 0856   PROT 7.2 12/27/2017 1019   PROT 8.8 (H) 06/28/2014 1512   ALBUMIN 3.7 12/27/2017 1019   ALBUMIN 4.0 06/28/2014 1512   AST 20 12/27/2017 1019   AST 37 06/28/2014 1512   ALT 13 12/27/2017 1019   ALT 43 06/28/2014 1512   ALKPHOS 35 (L) 12/27/2017 1019   ALKPHOS 53 06/28/2014 1512   BILITOT 0.8 12/27/2017 1019   BILITOT 0.3 06/28/2014 1512   GFRNONAA >60 12/27/2017 1019   GFRNONAA >60 07/27/2014 0856   GFRAA >60 12/27/2017 1019   GFRAA >60 07/27/2014 0856    No results found for: SPEP, UPEP  Lab Results  Component Value Date   WBC 4.6 12/27/2017   NEUTROABS 2.0 12/27/2017   HGB 12.5 (L) 12/27/2017   HCT 37.9 (L) 12/27/2017   MCV 89.6 12/27/2017   PLT 188 12/27/2017      Chemistry      Component Value Date/Time   NA 138 12/27/2017 1019   NA 136 07/27/2014 0856   K 4.5 12/27/2017 1019   K 3.9 07/27/2014 0856   CL 105 12/27/2017 1019   CL 104 07/27/2014 0856   CO2 27 12/27/2017 1019   CO2 27 07/27/2014 0856   BUN 12 12/27/2017 1019   BUN 10 07/27/2014 0856   CREATININE 0.90 12/27/2017 1019    CREATININE 0.86 07/27/2014 0856   CREATININE 0.77 07/06/2014      Component Value Date/Time   CALCIUM 9.2 12/27/2017 1019   CALCIUM 8.6 (L) 07/27/2014 0856   ALKPHOS 35 (L) 12/27/2017 1019   ALKPHOS 53 06/28/2014 1512   AST 20 12/27/2017 1019   AST 37 06/28/2014 1512   ALT 13 12/27/2017 1019   ALT 43 06/28/2014 1512   BILITOT 0.8 12/27/2017 1019   BILITOT 0.3 06/28/2014 1512       RADIOGRAPHIC STUDIES: I have personally reviewed the radiological images as listed and agreed with the findings in the report. No results found.   ASSESSMENT & PLAN:  No problem-specific Assessment & Plan notes found for this encounter.   No orders of the defined types were placed in this encounter.  All questions were answered. The patient knows to call the clinic with any problems, questions or concerns.      Cammie Sickle, MD 02/21/2018 1:10 PM

## 2018-03-11 ENCOUNTER — Other Ambulatory Visit: Payer: Self-pay

## 2018-03-11 DIAGNOSIS — C3411 Malignant neoplasm of upper lobe, right bronchus or lung: Secondary | ICD-10-CM

## 2018-03-14 ENCOUNTER — Inpatient Hospital Stay (HOSPITAL_BASED_OUTPATIENT_CLINIC_OR_DEPARTMENT_OTHER): Payer: BLUE CROSS/BLUE SHIELD | Admitting: Internal Medicine

## 2018-03-14 ENCOUNTER — Inpatient Hospital Stay: Payer: BLUE CROSS/BLUE SHIELD | Attending: Internal Medicine

## 2018-03-14 VITALS — BP 123/78 | HR 66 | Resp 16 | Wt 115.2 lb

## 2018-03-14 DIAGNOSIS — R531 Weakness: Secondary | ICD-10-CM | POA: Diagnosis not present

## 2018-03-14 DIAGNOSIS — C3411 Malignant neoplasm of upper lobe, right bronchus or lung: Secondary | ICD-10-CM | POA: Diagnosis not present

## 2018-03-14 DIAGNOSIS — R05 Cough: Secondary | ICD-10-CM | POA: Diagnosis not present

## 2018-03-14 DIAGNOSIS — R0602 Shortness of breath: Secondary | ICD-10-CM

## 2018-03-14 DIAGNOSIS — Z8249 Family history of ischemic heart disease and other diseases of the circulatory system: Secondary | ICD-10-CM | POA: Insufficient documentation

## 2018-03-14 DIAGNOSIS — F17210 Nicotine dependence, cigarettes, uncomplicated: Secondary | ICD-10-CM | POA: Diagnosis not present

## 2018-03-14 DIAGNOSIS — G8929 Other chronic pain: Secondary | ICD-10-CM | POA: Insufficient documentation

## 2018-03-14 DIAGNOSIS — B1920 Unspecified viral hepatitis C without hepatic coma: Secondary | ICD-10-CM | POA: Diagnosis not present

## 2018-03-14 DIAGNOSIS — M542 Cervicalgia: Secondary | ICD-10-CM | POA: Insufficient documentation

## 2018-03-14 DIAGNOSIS — Z79899 Other long term (current) drug therapy: Secondary | ICD-10-CM | POA: Diagnosis not present

## 2018-03-14 DIAGNOSIS — Z9221 Personal history of antineoplastic chemotherapy: Secondary | ICD-10-CM | POA: Insufficient documentation

## 2018-03-14 LAB — CBC WITH DIFFERENTIAL/PLATELET
Abs Immature Granulocytes: 0.01 10*3/uL (ref 0.00–0.07)
BASOS ABS: 0 10*3/uL (ref 0.0–0.1)
Basophils Relative: 0 %
EOS PCT: 2 %
Eosinophils Absolute: 0.1 10*3/uL (ref 0.0–0.5)
HCT: 37.3 % — ABNORMAL LOW (ref 39.0–52.0)
HEMOGLOBIN: 11.7 g/dL — AB (ref 13.0–17.0)
Immature Granulocytes: 0 %
LYMPHS PCT: 44 %
Lymphs Abs: 2.1 10*3/uL (ref 0.7–4.0)
MCH: 28.5 pg (ref 26.0–34.0)
MCHC: 31.4 g/dL (ref 30.0–36.0)
MCV: 90.8 fL (ref 80.0–100.0)
Monocytes Absolute: 0.6 10*3/uL (ref 0.1–1.0)
Monocytes Relative: 13 %
NEUTROS ABS: 2 10*3/uL (ref 1.7–7.7)
NRBC: 0 % (ref 0.0–0.2)
Neutrophils Relative %: 41 %
PLATELETS: 181 10*3/uL (ref 150–400)
RBC: 4.11 MIL/uL — AB (ref 4.22–5.81)
RDW: 13.5 % (ref 11.5–15.5)
WBC: 4.8 10*3/uL (ref 4.0–10.5)

## 2018-03-14 LAB — COMPREHENSIVE METABOLIC PANEL
ALT: 13 U/L (ref 0–44)
AST: 23 U/L (ref 15–41)
Albumin: 3.8 g/dL (ref 3.5–5.0)
Alkaline Phosphatase: 29 U/L — ABNORMAL LOW (ref 38–126)
Anion gap: 7 (ref 5–15)
BUN: 18 mg/dL (ref 6–20)
CHLORIDE: 106 mmol/L (ref 98–111)
CO2: 29 mmol/L (ref 22–32)
Calcium: 8.6 mg/dL — ABNORMAL LOW (ref 8.9–10.3)
Creatinine, Ser: 1.04 mg/dL (ref 0.61–1.24)
GFR calc Af Amer: >60 mL/min (ref >60)
Glucose, Bld: 105 mg/dL — ABNORMAL HIGH (ref 70–99)
POTASSIUM: 4.4 mmol/L (ref 3.5–5.1)
Sodium: 142 mmol/L (ref 135–145)
Total Bilirubin: 0.8 mg/dL (ref 0.3–1.2)
Total Protein: 7.1 g/dL (ref 6.5–8.1)

## 2018-03-14 NOTE — Progress Notes (Signed)
[facility] OFFICE PROGRESS NOTE  Patient Care Team: Tracie Harrier, MD as PCP - General (Internal Medicine) Mohammed Kindle, MD as Attending Physician (Pain Medicine)  Cancer Staging No matching staging information was found for the patient.   Oncology History   # MARCH 2016- LUNG CA- RUL- ADENO CA [mol testing-NA sec to poor samples] STAGE III s/p chemo-RT; [SEP 2016-CT Bx for mol testing; neg for malignancy]; CT-29th  NOV 2016-  Stable/ improved right upper lobe lesion;   # RECURRENT- CT JAN 2017- Stable right upper lobe lung nodule ~ 2.5cm; but progressive RML ~1.8cm; Start Tecentriq q 3W x3 ; March 30th CT 2017- Improved Lung Lesions; RML- 1.6x1.0; RUL ~3cm.; AUG 2nd 2017-CT stable.   # April/May 2018- RUL- local Progression on CT/PET- referral to rad-Onc  # July 2017 Elevated LFTs- likely from Hepatitis C [resolved on treatment- July 2018]; NOT from Tecentriq.  # Hep C [june 2018; treatment at [facility]]  # Chronic pain- on oxycodone.jan 2017-Bone scan- neg.  ---------------------------------------------------------------------------------    DIAGNOSIS: [ ]  RECURRENT ADENO CA LUNG  STAGE:  III   ; GOALS: CONTROL  CURRENT/MOST RECENT THERAPY [ ]  surveillance; last tecentrriq         Cancer of upper lobe of right lung (HCC)      INTERVAL HISTORY:  Mario [name] 57 y.o.  male pleasant patient above history of recurrent stage III adenocarcinoma the lung is here for follow-up/review the rest of the CT scan.  Patient complains of right neck pain-radiating to the right arm.  Complains of intermittent weakness in the right arm.  Patient continues to complain of chronic mild shortness of breath chronic cough.  Especially mornings.  Not compliant with inhalers.  Denies any hemoptysis.  Chronic pain for which he follows up with pain clinic.  Review of Systems  Constitutional: Negative for chills, diaphoresis, fever, malaise/fatigue and weight loss.   HENT: Negative for nosebleeds and sore throat.   Eyes: Negative for double vision.  Respiratory: Positive for cough and shortness of breath. Negative for hemoptysis, sputum production and wheezing.   Cardiovascular: Negative for chest pain, palpitations, orthopnea and leg swelling.  Gastrointestinal: Negative for abdominal pain, blood in stool, constipation, diarrhea, heartburn, melena, nausea and vomiting.  Genitourinary: Negative for dysuria, frequency and urgency.  Musculoskeletal: Positive for back pain and joint pain.  Skin: Negative.  Negative for itching and rash.  Neurological: Negative for dizziness, tingling, focal weakness, weakness and headaches.  Endo/Heme/Allergies: Does not bruise/bleed easily.  Psychiatric/Behavioral: Negative for depression. The patient is not nervous/anxious and does not have insomnia.       PAST MEDICAL HISTORY :  Past Medical History:  Diagnosis Date  . Arthritis   . Cancer of upper lobe of right lung ([place]) 09/19/2015  . Chicken pox   . Chronic pain 09/19/2015  . Current tobacco use 05/26/2014  . Elevated LFTs 09/19/2015  . Fracture of lower extremity 09/19/2015  . Kidney stones   . Lung cancer ([place]) 2015  . OA (osteoarthritis) of knee 05/26/2014  . Status post chemotherapy     PAST SURGICAL HISTORY :   Past Surgical History:  Procedure Laterality Date  . amputation of index finger Right 2010  . HAND SURGERY Right 2010   ORIF of right index finder, 2nd carpometacarpal joint dislocation right hand, proximal right humerus  . ORIF FEMUR FRACTURE Right 2010  . PORT-A-CATH REMOVAL N/A 01/15/2017   Procedure: REMOVAL PORT-A-CATH;  Surgeon: Nestor Lewandowsky, MD;  Location: [facility]  ORS;  Service: General;  Laterality: N/A;  . SHOULDER SURGERY Right 2010   pins and rods in shoulder    FAMILY HISTORY :   Family History  Problem Relation Age of Onset  . Hypertension Mother   . Hypertension Father     SOCIAL HISTORY:   Social History   Tobacco Use   . Smoking status: Current Every Day Smoker    Packs/day: 2.00    Years: 20.00    Pack years: 40.00    Types: Cigarettes  . Smokeless tobacco: Never Used  . Tobacco comment: down to 3 cigarettes a month  Substance Use Topics  . Alcohol use: Yes    Alcohol/week: 0.0 standard drinks    Comment: 2-4 Beers Weekly  . Drug use: Yes    Types: Marijuana    Comment: last use 3 days ago    ALLERGIES:  has No Known Allergies.  MEDICATIONS:  Current Outpatient Medications  Medication Sig Dispense Refill  . albuterol (PROVENTIL) (2.5 MG/3ML) 0.083% nebulizer solution Take 3 mLs (2.5 mg total) by nebulization every 6 (six) hours as needed for wheezing or shortness of breath. 75 mL 3  . Multiple Vitamin (MULTI-VITAMINS) TABS Take 1 tablet by mouth daily.    . nicotine (NICODERM CQ - DOSED IN MG/24 HOURS) 14 mg/24hr patch Place 14 mg onto the skin daily.     Marland Kitchen oxyCODONE (OXYCONTIN) 10 mg 12 hr tablet Take 10 mg by mouth every 12 (twelve) hours.     No current facility-administered medications for this visit.    Facility-Administered Medications Ordered in Other Visits  Medication Dose Route Frequency Provider Last Rate Last Dose  . sodium chloride flush (NS) 0.9 % injection 10 mL  10 mL Intracatheter PRN Cammie Sickle, MD   10 mL at 05/26/15 1300  . sodium chloride flush (NS) 0.9 % injection 10 mL  10 mL Intravenous PRN Cammie Sickle, MD   10 mL at 07/07/15 1425    PHYSICAL EXAMINATION: ECOG PERFORMANCE STATUS: 0 - Asymptomatic  BP 123/78 (BP Location: Left Arm, Patient Position: Sitting)   Pulse 66   Resp 16   Wt 115 lb 3.2 oz (52.3 kg)   BMI 17.01 kg/m   Filed Weights   03/14/18 1329  Weight: 115 lb 3.2 oz (52.3 kg)    Physical Exam  Constitutional: He is oriented to person, place, and time.  Thin built moderately nourished male patient no acute distress.  Is alone.  HENT:  Head: Normocephalic and atraumatic.  Mouth/Throat: Oropharynx is clear and moist. No  oropharyngeal exudate.  Eyes: Pupils are equal, round, and reactive to light.  Neck: Normal range of motion. Neck supple.  Cardiovascular: Normal rate and regular rhythm.  Pulmonary/Chest: No respiratory distress. He has no wheezes.  Decreased breath sounds bil.   Abdominal: Soft. Bowel sounds are normal. He exhibits no distension and no mass. There is no abdominal tenderness. There is no rebound and no guarding.  Musculoskeletal: Normal range of motion.        General: No tenderness or edema.  Neurological: He is alert and oriented to person, place, and time.  Skin: Skin is warm.  Psychiatric: Affect normal.       LABORATORY DATA:  I have reviewed the data as listed    Component Value Date/Time   NA 142 03/14/2018 1303   NA 136 07/27/2014 0856   K 4.4 03/14/2018 1303   K 3.9 07/27/2014 0856   CL 106 03/14/2018 1303  CL 104 07/27/2014 0856   CO2 29 03/14/2018 1303   CO2 27 07/27/2014 0856   GLUCOSE 105 (H) 03/14/2018 1303   GLUCOSE 151 (H) 07/27/2014 0856   BUN 18 03/14/2018 1303   BUN 10 07/27/2014 0856   CREATININE 1.04 03/14/2018 1303   CREATININE 0.86 07/27/2014 0856   CREATININE 0.77 07/06/2014   CALCIUM 8.6 (L) 03/14/2018 1303   CALCIUM 8.6 (L) 07/27/2014 0856   PROT 7.1 03/14/2018 1303   PROT 8.8 (H) 06/28/2014 1512   ALBUMIN 3.8 03/14/2018 1303   ALBUMIN 4.0 06/28/2014 1512   AST 23 03/14/2018 1303   AST 37 06/28/2014 1512   ALT 13 03/14/2018 1303   ALT 43 06/28/2014 1512   ALKPHOS 29 (L) 03/14/2018 1303   ALKPHOS 53 06/28/2014 1512   BILITOT 0.8 03/14/2018 1303   BILITOT 0.3 06/28/2014 1512   GFRNONAA >60 03/14/2018 1303   GFRNONAA >60 07/27/2014 0856   GFRAA >60 03/14/2018 1303   GFRAA >60 07/27/2014 0856    No results found for: SPEP, UPEP  Lab Results  Component Value Date   WBC 4.8 03/14/2018   NEUTROABS 2.0 03/14/2018   HGB 11.7 (L) 03/14/2018   HCT 37.3 (L) 03/14/2018   MCV 90.8 03/14/2018   PLT 181 03/14/2018      Chemistry       Component Value Date/Time   NA 142 03/14/2018 1303   NA 136 07/27/2014 0856   K 4.4 03/14/2018 1303   K 3.9 07/27/2014 0856   CL 106 03/14/2018 1303   CL 104 07/27/2014 0856   CO2 29 03/14/2018 1303   CO2 27 07/27/2014 0856   BUN 18 03/14/2018 1303   BUN 10 07/27/2014 0856   CREATININE 1.04 03/14/2018 1303   CREATININE 0.86 07/27/2014 0856   CREATININE 0.77 07/06/2014      Component Value Date/Time   CALCIUM 8.6 (L) 03/14/2018 1303   CALCIUM 8.6 (L) 07/27/2014 0856   ALKPHOS 29 (L) 03/14/2018 1303   ALKPHOS 53 06/28/2014 1512   AST 23 03/14/2018 1303   AST 37 06/28/2014 1512   ALT 13 03/14/2018 1303   ALT 43 06/28/2014 1512   BILITOT 0.8 03/14/2018 1303   BILITOT 0.3 06/28/2014 1512       RADIOGRAPHIC STUDIES: I have personally reviewed the radiological images as listed and agreed with the findings in the report. No results found.   ASSESSMENT & PLAN:  Cancer of upper lobe of right lung [facility]) #Recurrent adenocarcinoma of the lung-right upper lobe/status post right upper lobe nodule SBRT June 2018.  December 2019 CT scan-stable chronic right upper lobe lung infiltrative changes-suggestive of radiation.  #Right neck pain radiating to the right arm-recommend MRI of the cervical spine ASAP.  #Hepatitis C/status post treatment; resolved; stable followed at [facility].  # Chronic pain- unrelated to cancer follow up with pain clinic; stable  # Chronic shortness of breath/COPD; stable  #Active smoker- counseled to quit.  Stable.  # DISPOSITION:  # MRI neck asap-we will call with results. # CT scan in 2 months     Orders Placed This Encounter  Procedures  . CT CHEST W CONTRAST    Standing Status:   Future    Standing Expiration Date:   03/15/2019    Order Specific Question:   If indicated for the ordered procedure, I authorize the administration of contrast media per Radiology protocol    Answer:   Yes    Order Specific Question:   Preferred imaging location?  Answer:   [facility]    Order Specific Question:   Radiology Contrast Protocol - do NOT remove file path    Answer:   \\[email or web address]    Order Specific Question:   ** REASON FOR EXAM (FREE TEXT)    Answer:   lung cancer  . MR Cervical Spine W Wo Contrast    Standing Status:   Future    Standing Expiration Date:   03/14/2019    Order Specific Question:   ** REASON FOR EXAM (FREE TEXT)    Answer:   neck pain; hx of lung cancer    Order Specific Question:   If indicated for the ordered procedure, I authorize the administration of contrast media per Radiology protocol    Answer:   Yes    Order Specific Question:   What is the patient's sedation requirement?    Answer:   No Sedation    Order Specific Question:   Does the patient have a pacemaker or implanted devices?    Answer:   No    Order Specific Question:   Use SRS Protocol?    Answer:   No    Order Specific Question:   Radiology Contrast Protocol - do NOT remove file path    Answer:   \\[email or web address]    Order Specific Question:   Preferred imaging location?    Answer:   [facility] (table limit-300lbs)   All questions were answered. The patient knows to call the clinic with any problems, questions or concerns.      Cammie Sickle, MD 03/16/2018 6:20 PM

## 2018-03-14 NOTE — Assessment & Plan Note (Addendum)
#  Recurrent adenocarcinoma of the lung-right upper lobe/status post right upper lobe nodule SBRT June 2018.  December 2019 CT scan-stable chronic right upper lobe lung infiltrative changes-suggestive of radiation.  #Right neck pain radiating to the right arm-recommend MRI of the cervical spine ASAP.  #Hepatitis C/status post treatment; resolved; stable followed at [facility].  # Chronic pain- unrelated to cancer follow up with pain clinic; stable  # Chronic shortness of breath/COPD; stable  #Active smoker- counseled to quit.  Stable.  # DISPOSITION:  # MRI neck asap-we will call with results. # CT scan in 2 months

## 2018-03-16 ENCOUNTER — Encounter: Payer: Self-pay | Admitting: Internal Medicine

## 2018-03-25 ENCOUNTER — Ambulatory Visit (HOSPITAL_COMMUNITY): Payer: BLUE CROSS/BLUE SHIELD

## 2018-04-03 ENCOUNTER — Ambulatory Visit
Admission: RE | Admit: 2018-04-03 | Discharge: 2018-04-03 | Disposition: A | Discharge status: Home / Self Care | Payer: BLUE CROSS/BLUE SHIELD | Source: Ambulatory Visit | Attending: Internal Medicine | Admitting: Internal Medicine

## 2018-04-03 DIAGNOSIS — M542 Cervicalgia: Secondary | ICD-10-CM | POA: Diagnosis present

## 2018-04-03 DIAGNOSIS — C3411 Malignant neoplasm of upper lobe, right bronchus or lung: Secondary | ICD-10-CM | POA: Diagnosis present

## 2018-04-03 MED ORDER — GADOBUTROL 1 MMOL/ML IV SOLN
5.0000 mL | Freq: Once | INTRAVENOUS | Status: AC | PRN
Start: 2018-04-03 — End: 2018-04-03
  Administered 2018-04-03: 5 mL via INTRAVENOUS

## 2018-04-08 ENCOUNTER — Telehealth: Payer: Self-pay | Admitting: Internal Medicine

## 2018-04-08 NOTE — Telephone Encounter (Signed)
Mario [name]/Mario [name]-please inform patient that his MRI of the neck shows arthritis; but no evidence of cancer.  Recommend follow-up with PCP and pain doctor as planned.  Otherwise no new recommendations from our standpoint continue follow-up as discussed/planned.  Dr.B

## 2018-04-08 NOTE — Telephone Encounter (Signed)
Attempted to contact patient, but voicemail box was not set up. I will try again later.

## 2018-05-14 ENCOUNTER — Ambulatory Visit
Admission: RE | Admit: 2018-05-14 | Discharge: 2018-05-14 | Disposition: A | Discharge status: Home / Self Care | Payer: BLUE CROSS/BLUE SHIELD | Source: Ambulatory Visit | Attending: Internal Medicine | Admitting: Internal Medicine

## 2018-05-14 ENCOUNTER — Ambulatory Visit: Admission: RE | Admit: 2018-05-14 | Payer: BLUE CROSS/BLUE SHIELD | Source: Ambulatory Visit

## 2018-05-14 DIAGNOSIS — C3411 Malignant neoplasm of upper lobe, right bronchus or lung: Secondary | ICD-10-CM | POA: Insufficient documentation

## 2018-05-14 MED ORDER — IOPAMIDOL (ISOVUE-300) INJECTION 61%
60.0000 mL | Freq: Once | INTRAVENOUS | Status: AC | PRN
  Administered 2018-05-14: 60 mL via INTRAVENOUS

## 2018-05-16 ENCOUNTER — Inpatient Hospital Stay: Payer: BLUE CROSS/BLUE SHIELD | Attending: Internal Medicine

## 2018-05-16 ENCOUNTER — Encounter: Payer: Self-pay | Admitting: Internal Medicine

## 2018-05-16 ENCOUNTER — Inpatient Hospital Stay (HOSPITAL_BASED_OUTPATIENT_CLINIC_OR_DEPARTMENT_OTHER): Payer: BLUE CROSS/BLUE SHIELD | Admitting: Internal Medicine

## 2018-05-16 ENCOUNTER — Other Ambulatory Visit: Payer: Self-pay

## 2018-05-16 VITALS — BP 162/68 | HR 66 | Temp 97.6°F | Resp 20 | Ht 69.0 in | Wt 113.0 lb

## 2018-05-16 DIAGNOSIS — F17210 Nicotine dependence, cigarettes, uncomplicated: Secondary | ICD-10-CM | POA: Insufficient documentation

## 2018-05-16 DIAGNOSIS — G8929 Other chronic pain: Secondary | ICD-10-CM

## 2018-05-16 DIAGNOSIS — M2550 Pain in unspecified joint: Secondary | ICD-10-CM | POA: Insufficient documentation

## 2018-05-16 DIAGNOSIS — M549 Dorsalgia, unspecified: Secondary | ICD-10-CM | POA: Insufficient documentation

## 2018-05-16 DIAGNOSIS — Z79899 Other long term (current) drug therapy: Secondary | ICD-10-CM

## 2018-05-16 DIAGNOSIS — Z9221 Personal history of antineoplastic chemotherapy: Secondary | ICD-10-CM | POA: Insufficient documentation

## 2018-05-16 DIAGNOSIS — C3411 Malignant neoplasm of upper lobe, right bronchus or lung: Secondary | ICD-10-CM

## 2018-05-16 LAB — CBC WITH DIFFERENTIAL/PLATELET
ABS IMMATURE GRANULOCYTES: 0.02 10*3/uL (ref 0.00–0.07)
Basophils Absolute: 0 10*3/uL (ref 0.0–0.1)
Basophils Relative: 0 %
EOS ABS: 0.2 10*3/uL (ref 0.0–0.5)
Eosinophils Relative: 3 %
HEMATOCRIT: 42.1 % (ref 39.0–52.0)
HEMOGLOBIN: 13.3 g/dL (ref 13.0–17.0)
Immature Granulocytes: 0 %
LYMPHS ABS: 1.5 10*3/uL (ref 0.7–4.0)
LYMPHS PCT: 26 %
MCH: 28.2 pg (ref 26.0–34.0)
MCHC: 31.6 g/dL (ref 30.0–36.0)
MCV: 89.2 fL (ref 80.0–100.0)
Monocytes Absolute: 0.7 10*3/uL (ref 0.1–1.0)
Monocytes Relative: 11 %
NEUTROS PCT: 60 %
Neutro Abs: 3.5 10*3/uL (ref 1.7–7.7)
Platelets: 230 10*3/uL (ref 150–400)
RBC: 4.72 MIL/uL (ref 4.22–5.81)
RDW: 13.2 % (ref 11.5–15.5)
WBC: 5.9 10*3/uL (ref 4.0–10.5)
nRBC: 0 % (ref 0.0–0.2)

## 2018-05-16 LAB — COMPREHENSIVE METABOLIC PANEL
ALK PHOS: 30 U/L — AB (ref 38–126)
ALT: 14 U/L (ref 0–44)
ANION GAP: 7 (ref 5–15)
AST: 26 U/L (ref 15–41)
Albumin: 4.2 g/dL (ref 3.5–5.0)
BUN: 10 mg/dL (ref 6–20)
CALCIUM: 8.8 mg/dL — AB (ref 8.9–10.3)
CHLORIDE: 100 mmol/L (ref 98–111)
CO2: 28 mmol/L (ref 22–32)
Creatinine, Ser: 0.94 mg/dL (ref 0.61–1.24)
GFR calc Af Amer: >60 mL/min (ref >60)
GFR calc non Af Amer: >60 mL/min (ref >60)
GLUCOSE: 102 mg/dL — AB (ref 70–99)
Potassium: 3.9 mmol/L (ref 3.5–5.1)
SODIUM: 135 mmol/L (ref 135–145)
Total Bilirubin: 1.2 mg/dL (ref 0.3–1.2)
Total Protein: 7.9 g/dL (ref 6.5–8.1)

## 2018-05-16 MED ORDER — ALBUTEROL SULFATE HFA 108 (90 BASE) MCG/ACT IN AERS: 2.0000 | INHALATION_SPRAY | Freq: Four times a day (QID) | RESPIRATORY_TRACT | 2 refills | Status: DC | PRN

## 2018-05-16 MED ORDER — FLUTICASONE-SALMETEROL 500-50 MCG/DOSE IN AEPB: 1.0000 | INHALATION_SPRAY | Freq: Two times a day (BID) | RESPIRATORY_TRACT | 3 refills | Status: DC

## 2018-05-16 NOTE — Assessment & Plan Note (Addendum)
#  Recurrent adenocarcinoma of the lung-right upper lobe/status post right upper lobe nodule SBRT June 2018.  FEB 2020- STABLE RUL changes.   # COPD/cough phlegm- not controlled/ worse- sent scripts for albuterol/advair.   #Hepatitis C/status post treatment; resolved-stable.   # Chronic pain- unrelated to cancer follow up with pain clinic; stable.   #Active smoker- counseled to quit.  Stable.  # DISPOSITION:  # Follow up in 6 months- MD/labs-cbc/cmp CT scan chest prior-Dr.B  # I reviewed the blood work- with the patient in detail; also reviewed the imaging independently [as summarized above]; and with the patient in detail.

## 2018-05-16 NOTE — Progress Notes (Signed)
[place] OFFICE PROGRESS NOTE  Patient Care Team: Tracie Harrier, MD as PCP - General (Internal Medicine) Mohammed Kindle, MD as Referring Physician (Pain Medicine)  Cancer Staging No matching staging information was found for the patient.   Oncology History   # MARCH 2016- LUNG CA- RUL- ADENO CA [mol testing-NA sec to poor samples] STAGE III s/p chemo-RT; [SEP 2016-CT Bx for mol testing; neg for malignancy]; CT-29th  NOV 2016-  Stable/ improved right upper lobe lesion;   # RECURRENT- CT JAN 2017- Stable right upper lobe lung nodule ~ 2.5cm; but progressive RML ~1.8cm; Start Tecentriq q 3W x3 ; March 30th CT 2017- Improved Lung Lesions; RML- 1.6x1.0; RUL ~3cm.; AUG 2nd 2017-CT stable.   # April/May 2018- RUL- local Progression on CT/PET- referral to rad-Onc  # July 2017 Elevated LFTs- likely from Hepatitis C [resolved on treatment- July 2018]; NOT from Tecentriq.  # Hep C [june 2018; treatment at [facility]]  # Chronic pain- on oxycodone.jan 2017-Bone scan- neg.  ---------------------------------------------------------------------------------    DIAGNOSIS: [ ]  RECURRENT ADENO CA LUNG  STAGE:  III   ; GOALS: CONTROL  CURRENT/MOST RECENT THERAPY [ ]  surveillance; last tecentrriq         Cancer of upper lobe of right lung (HCC)      INTERVAL HISTORY:  Mario [name] 58 y.o.  male pleasant patient above history of recurrent stage III adenocarcinoma the lung is here for follow-up/review the results of  CT scan.  Patient complains of chronic cough chronic shortness of breath.  Phlegm especially mornings.  No hemoptysis.  He ran out office inhalers.  History of chronic joint pains; back pain for which he follows up in the pain clinic.   Review of Systems  Constitutional: Negative for chills, diaphoresis, fever, malaise/fatigue and weight loss.  HENT: Negative for nosebleeds and sore throat.   Eyes: Negative for double vision.  Respiratory: Positive  for cough and shortness of breath. Negative for hemoptysis, sputum production and wheezing.   Cardiovascular: Negative for chest pain, palpitations, orthopnea and leg swelling.  Gastrointestinal: Negative for abdominal pain, blood in stool, constipation, diarrhea, heartburn, melena, nausea and vomiting.  Genitourinary: Negative for dysuria, frequency and urgency.  Musculoskeletal: Positive for back pain and joint pain.  Skin: Negative.  Negative for itching and rash.  Neurological: Negative for dizziness, tingling, focal weakness, weakness and headaches.  Endo/Heme/Allergies: Does not bruise/bleed easily.  Psychiatric/Behavioral: Negative for depression. The patient is not nervous/anxious and does not have insomnia.       PAST MEDICAL HISTORY :  Past Medical History:  Diagnosis Date  . Arthritis   . Cancer of upper lobe of right lung ([place]) 09/19/2015  . Chicken pox   . Chronic pain 09/19/2015  . Current tobacco use 05/26/2014  . Elevated LFTs 09/19/2015  . Fracture of lower extremity 09/19/2015  . Kidney stones   . Lung cancer ([place]) 2015  . OA (osteoarthritis) of knee 05/26/2014  . Status post chemotherapy     PAST SURGICAL HISTORY :   Past Surgical History:  Procedure Laterality Date  . amputation of index finger Right 2010  . HAND SURGERY Right 2010   ORIF of right index finder, 2nd carpometacarpal joint dislocation right hand, proximal right humerus  . ORIF FEMUR FRACTURE Right 2010  . PORT-A-CATH REMOVAL N/A 01/15/2017   Procedure: REMOVAL PORT-A-CATH;  Surgeon: Nestor Lewandowsky, MD;  Location: [facility] ORS;  Service: General;  Laterality: N/A;  . SHOULDER SURGERY Right 2010  pins and rods in shoulder    FAMILY HISTORY :   Family History  Problem Relation Age of Onset  . Hypertension Mother   . Hypertension Father     SOCIAL HISTORY:   Social History   Tobacco Use  . Smoking status: Current Every Day Smoker    Packs/day: 2.00    Years: 20.00    Pack years: 40.00     Types: Cigarettes  . Smokeless tobacco: Never Used  . Tobacco comment: down to 3 cigarettes a month  Substance Use Topics  . Alcohol use: Yes    Alcohol/week: 0.0 standard drinks    Comment: 2-4 Beers Weekly  . Drug use: Yes    Types: Marijuana    Comment: last use 3 days ago    ALLERGIES:  has No Known Allergies.  MEDICATIONS:  Current Outpatient Medications  Medication Sig Dispense Refill  . albuterol (PROVENTIL) (2.5 MG/3ML) 0.083% nebulizer solution Take 3 mLs (2.5 mg total) by nebulization every 6 (six) hours as needed for wheezing or shortness of breath. 75 mL 3  . Multiple Vitamin (MULTI-VITAMINS) TABS Take 1 tablet by mouth daily.    . nicotine (NICODERM CQ - DOSED IN MG/24 HOURS) 14 mg/24hr patch Place 14 mg onto the skin daily.     Marland Kitchen oxyCODONE (OXYCONTIN) 10 mg 12 hr tablet Take 10 mg by mouth every 12 (twelve) hours.    . pregabalin (LYRICA) 25 MG capsule Take 1 capsule by mouth 2 (two) times daily.    Marland Kitchen albuterol (PROVENTIL HFA;VENTOLIN HFA) 108 (90 Base) MCG/ACT inhaler Inhale 2 puffs into the lungs every 6 (six) hours as needed for wheezing or shortness of breath. 1 Inhaler 2  . Fluticasone-Salmeterol (ADVAIR DISKUS) 500-50 MCG/DOSE AEPB Inhale 1 puff into the lungs 2 (two) times daily. 1 each 3  . gabapentin (NEURONTIN) 100 MG capsule Take 1 capsule by mouth 3 (three) times daily.  0   No current facility-administered medications for this visit.    Facility-Administered Medications Ordered in Other Visits  Medication Dose Route Frequency Provider Last Rate Last Dose  . sodium chloride flush (NS) 0.9 % injection 10 mL  10 mL Intracatheter PRN Cammie Sickle, MD   10 mL at 05/26/15 1300  . sodium chloride flush (NS) 0.9 % injection 10 mL  10 mL Intravenous PRN Cammie Sickle, MD   10 mL at 07/07/15 1425    PHYSICAL EXAMINATION: ECOG PERFORMANCE STATUS: 0 - Asymptomatic  BP (!) 162/68 Comment: manual blood pressure  Pulse 66   Temp 97.6 F (36.4 C)  (Tympanic)   Resp 20   Ht 5\' 9"  (1.753 m)   Wt 113 lb (51.3 kg)   BMI 16.69 kg/m   Filed Weights   05/16/18 1253  Weight: 113 lb (51.3 kg)    Physical Exam  Constitutional: He is oriented to person, place, and time.  Thin built moderately nourished male patient no acute distress.  Is alone.  HENT:  Head: Normocephalic and atraumatic.  Mouth/Throat: Oropharynx is clear and moist. No oropharyngeal exudate.  Eyes: Pupils are equal, round, and reactive to light.  Neck: Normal range of motion. Neck supple.  Cardiovascular: Normal rate and regular rhythm.  Pulmonary/Chest: No respiratory distress. He has no wheezes.  Decreased breath sounds bil.   Abdominal: Soft. Bowel sounds are normal. He exhibits no distension and no mass. There is no abdominal tenderness. There is no rebound and no guarding.  Musculoskeletal: Normal range of motion.  General: No tenderness or edema.  Neurological: He is alert and oriented to person, place, and time.  Skin: Skin is warm.  Psychiatric: Affect normal.       LABORATORY DATA:  I have reviewed the data as listed    Component Value Date/Time   NA 142 03/14/2018 1303   NA 136 07/27/2014 0856   K 4.4 03/14/2018 1303   K 3.9 07/27/2014 0856   CL 106 03/14/2018 1303   CL 104 07/27/2014 0856   CO2 29 03/14/2018 1303   CO2 27 07/27/2014 0856   GLUCOSE 105 (H) 03/14/2018 1303   GLUCOSE 151 (H) 07/27/2014 0856   BUN 18 03/14/2018 1303   BUN 10 07/27/2014 0856   CREATININE 1.04 03/14/2018 1303   CREATININE 0.86 07/27/2014 0856   CREATININE 0.77 07/06/2014   CALCIUM 8.6 (L) 03/14/2018 1303   CALCIUM 8.6 (L) 07/27/2014 0856   PROT 7.1 03/14/2018 1303   PROT 8.8 (H) 06/28/2014 1512   ALBUMIN 3.8 03/14/2018 1303   ALBUMIN 4.0 06/28/2014 1512   AST 23 03/14/2018 1303   AST 37 06/28/2014 1512   ALT 13 03/14/2018 1303   ALT 43 06/28/2014 1512   ALKPHOS 29 (L) 03/14/2018 1303   ALKPHOS 53 06/28/2014 1512   BILITOT 0.8 03/14/2018 1303    BILITOT 0.3 06/28/2014 1512   GFRNONAA >60 03/14/2018 1303   GFRNONAA >60 07/27/2014 0856   GFRAA >60 03/14/2018 1303   GFRAA >60 07/27/2014 0856    No results found for: SPEP, UPEP  Lab Results  Component Value Date   WBC 5.9 05/16/2018   NEUTROABS 3.5 05/16/2018   HGB 13.3 05/16/2018   HCT 42.1 05/16/2018   MCV 89.2 05/16/2018   PLT 230 05/16/2018      Chemistry      Component Value Date/Time   NA 142 03/14/2018 1303   NA 136 07/27/2014 0856   K 4.4 03/14/2018 1303   K 3.9 07/27/2014 0856   CL 106 03/14/2018 1303   CL 104 07/27/2014 0856   CO2 29 03/14/2018 1303   CO2 27 07/27/2014 0856   BUN 18 03/14/2018 1303   BUN 10 07/27/2014 0856   CREATININE 1.04 03/14/2018 1303   CREATININE 0.86 07/27/2014 0856   CREATININE 0.77 07/06/2014      Component Value Date/Time   CALCIUM 8.6 (L) 03/14/2018 1303   CALCIUM 8.6 (L) 07/27/2014 0856   ALKPHOS 29 (L) 03/14/2018 1303   ALKPHOS 53 06/28/2014 1512   AST 23 03/14/2018 1303   AST 37 06/28/2014 1512   ALT 13 03/14/2018 1303   ALT 43 06/28/2014 1512   BILITOT 0.8 03/14/2018 1303   BILITOT 0.3 06/28/2014 1512       RADIOGRAPHIC STUDIES: I have personally reviewed the radiological images as listed and agreed with the findings in the report. Ct Chest W Contrast  Result Date: 05/15/2018 CLINICAL DATA:  Adenocarcinoma of the right upper lobe, status post SBRT in June of 2018. EXAM: CT CHEST WITH CONTRAST TECHNIQUE: Multidetector CT imaging of the chest was performed during intravenous contrast administration. CONTRAST:  75mL ISOVUE-300 IOPAMIDOL (ISOVUE-300) INJECTION 61% COMPARISON:  01/27/2018 FINDINGS: Cardiovascular: Aortic atherosclerosis. Normal heart size, without pericardial effusion. Multivessel coronary artery atherosclerosis. No central pulmonary embolism, on this non-dedicated study. Mediastinum/Nodes: No supraclavicular adenopathy. No mediastinal or hilar adenopathy. Lungs/Pleura: Right apical pleural thickening.   No pleural fluid. Moderate centrilobular emphysema. Nodule along the right minor fissure is unchanged at 4 mm on image 67/4. Similar biapical pleuroparenchymal scarring  with nodularity. Mild ground-glass nodularity in the left lower lobe, including on image 125/4, is new or more conspicuous today. Similar configuration of right suprahilar and posterior upper lobe consolidation, traction bronchiectasis, and architectural distortion. Upper Abdomen: Normal imaged portions of the liver, spleen, stomach, pancreas, adrenal glands, kidneys. Musculoskeletal: Remote trauma involving the proximal right humerus and right-sided ribs. IMPRESSION: 1. Similar configuration of right suprahilar and posterior upper lobe consolidation, architectural distortion, and traction bronchiectasis. 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 2. No findings of recurrent or progressive disease. 3. Aortic atherosclerosis (ICD10-I70.0), coronary artery atherosclerosis and emphysema (ICD10-J43.9). 4. Similar 4 mm nodule along the right minor fissure. 5. Left lower lobe mild ground-glass nodularity is new or more conspicuous today. Correlate with interval infectious symptoms. Electronically Signed   By: Abigail Miyamoto M.D.   On: 05/15/2018 08:20     ASSESSMENT & PLAN:  Cancer of upper lobe of right lung [facility]) #Recurrent adenocarcinoma of the lung-right upper lobe/status post right upper lobe nodule SBRT June 2018.  FEB 2020- STABLE RUL changes.   # COPD/cough phlegm- not controlled/ worse- sent scripts for albuterol/advair.   #Hepatitis C/status post treatment; resolved-stable.   # Chronic pain- unrelated to cancer follow up with pain clinic; stable.   #Active smoker- counseled to quit.  Stable.  # DISPOSITION:  # Follow up in 6 months- MD/labs-cbc/cmp CT scan chest prior-Dr.B     Orders Placed This Encounter  Procedures  . CT CHEST W CONTRAST    Standing Status:   Future    Standing Expiration Date:   05/17/2019     Order Specific Question:   If indicated for the ordered procedure, I authorize the administration of contrast media per Radiology protocol    Answer:   Yes    Order Specific Question:   Preferred imaging location?    Answer:   [facility]    Order Specific Question:   Radiology Contrast Protocol - do NOT remove file path    Answer:   [email or web address]    Order Specific Question:   ** REASON FOR EXAM (FREE TEXT)    Answer:   lung cancer   All questions were answered. The patient knows to call the clinic with any problems, questions or concerns.      Cammie Sickle, MD 05/16/2018 1:18 PM

## 2018-06-03 ENCOUNTER — Telehealth: Payer: Self-pay | Admitting: Oncology

## 2018-06-03 NOTE — Telephone Encounter (Signed)
[place] contacted to obtain verbal, telephone consent to share their name and contact information with [place] Scientist, product/process development and team) for purposes of soliciting patient experience feedback.  Verbal consent obtained and documented on "[place] / [place] INFORMATION" form.  AUDEN TATAR is aware that [place] will be in contact with them at a future date for screening purposes for interviews.  Please direct questions related to this process to Sherry Ruffing via email at [email or web address] or extension [phone].

## 2018-11-14 ENCOUNTER — Other Ambulatory Visit: Payer: Self-pay

## 2018-11-14 ENCOUNTER — Other Ambulatory Visit: Payer: Self-pay | Admitting: *Deleted

## 2018-11-14 ENCOUNTER — Ambulatory Visit: Admission: RE | Admit: 2018-11-14 | Payer: Self-pay | Source: Ambulatory Visit

## 2018-11-14 DIAGNOSIS — C3411 Malignant neoplasm of upper lobe, right bronchus or lung: Secondary | ICD-10-CM

## 2018-11-17 ENCOUNTER — Inpatient Hospital Stay: Payer: Self-pay | Attending: Internal Medicine

## 2018-11-17 ENCOUNTER — Inpatient Hospital Stay: Payer: Self-pay | Admitting: Internal Medicine

## 2018-11-17 NOTE — Assessment & Plan Note (Deleted)
#  Recurrent adenocarcinoma of the lung-right upper lobe/status post right upper lobe nodule SBRT June 2018.  FEB 2020- STABLE RUL changes.   # COPD/cough phlegm- not controlled/ worse- sent scripts for albuterol/advair.   #Hepatitis C/status post treatment; resolved-stable.   # Chronic pain- unrelated to cancer follow up with pain clinic; stable.   #Active smoker- counseled to quit.  Stable.  # DISPOSITION:  # Follow up in 6 months- MD/labs-cbc/cmp CT scan chest prior-Dr.B  # I reviewed the blood work- with the patient in detail; also reviewed the imaging independently [as summarized above]; and with the patient in detail.

## 2018-11-17 NOTE — Progress Notes (Deleted)
[place] OFFICE PROGRESS NOTE  Patient Care Team: Tracie Harrier, MD as PCP - General (Internal Medicine)  Cancer Staging No matching staging information was found for the patient.   Oncology History Overview Note  # MARCH 2016- LUNG CA- RUL- ADENO CA [mol testing-NA sec to poor samples] STAGE III s/p chemo-RT; [SEP 2016-CT Bx for mol testing; neg for malignancy]; CT-29th  NOV 2016-  Stable/ improved right upper lobe lesion;   # RECURRENT- CT JAN 2017- Stable right upper lobe lung nodule ~ 2.5cm; but progressive RML ~1.8cm; Start Tecentriq q 3W x3 ; March 30th CT 2017- Improved Lung Lesions; RML- 1.6x1.0; RUL ~3cm.; AUG 2nd 2017-CT stable.   # April/May 2018- RUL- local Progression on CT/PET- referral to rad-Onc  # July 2017 Elevated LFTs- likely from Hepatitis C [resolved on treatment- July 2018]; NOT from Tecentriq.  # Hep C [june 2018; treatment at [facility]]  # Chronic pain- on oxycodone.jan 2017-Bone scan- neg.  ---------------------------------------------------------------------------------    DIAGNOSIS: [ ]  RECURRENT ADENO CA LUNG  STAGE:  III   ; GOALS: CONTROL  CURRENT/MOST RECENT THERAPY [ ]  surveillance; last tecentrriq       Cancer of upper lobe of right lung (HCC)      INTERVAL HISTORY:  Mario [name] 58 y.o.  male pleasant patient above history of recurrent stage III adenocarcinoma the lung is here for follow-up/review the results of  CT scan.  Patient complains of chronic cough chronic shortness of breath.  Phlegm especially mornings.  No hemoptysis.  He ran out office inhalers.  History of chronic joint pains; back pain for which he follows up in the pain clinic.   Review of Systems  Constitutional: Negative for chills, diaphoresis, fever, malaise/fatigue and weight loss.  HENT: Negative for nosebleeds and sore throat.   Eyes: Negative for double vision.  Respiratory: Positive for cough and shortness of breath. Negative for  hemoptysis, sputum production and wheezing.   Cardiovascular: Negative for chest pain, palpitations, orthopnea and leg swelling.  Gastrointestinal: Negative for abdominal pain, blood in stool, constipation, diarrhea, heartburn, melena, nausea and vomiting.  Genitourinary: Negative for dysuria, frequency and urgency.  Musculoskeletal: Positive for back pain and joint pain.  Skin: Negative.  Negative for itching and rash.  Neurological: Negative for dizziness, tingling, focal weakness, weakness and headaches.  Endo/Heme/Allergies: Does not bruise/bleed easily.  Psychiatric/Behavioral: Negative for depression. The patient is not nervous/anxious and does not have insomnia.       PAST MEDICAL HISTORY :  Past Medical History:  Diagnosis Date  . Arthritis   . Cancer of upper lobe of right lung ([place]) 09/19/2015  . Chicken pox   . Chronic pain 09/19/2015  . Current tobacco use 05/26/2014  . Elevated LFTs 09/19/2015  . Fracture of lower extremity 09/19/2015  . Kidney stones   . Lung cancer ([place]) 2015  . OA (osteoarthritis) of knee 05/26/2014  . Status post chemotherapy     PAST SURGICAL HISTORY :   Past Surgical History:  Procedure Laterality Date  . amputation of index finger Right 2010  . HAND SURGERY Right 2010   ORIF of right index finder, 2nd carpometacarpal joint dislocation right hand, proximal right humerus  . ORIF FEMUR FRACTURE Right 2010  . PORT-A-CATH REMOVAL N/A 01/15/2017   Procedure: REMOVAL PORT-A-CATH;  Surgeon: Nestor Lewandowsky, MD;  Location: [facility] ORS;  Service: General;  Laterality: N/A;  . SHOULDER SURGERY Right 2010   pins and rods in shoulder    FAMILY  HISTORY :   Family History  Problem Relation Age of Onset  . Hypertension Mother   . Hypertension Father     SOCIAL HISTORY:   Social History   Tobacco Use  . Smoking status: Current Every Day Smoker    Packs/day: 2.00    Years: 20.00    Pack years: 40.00    Types: Cigarettes  . Smokeless tobacco: Never Used   . Tobacco comment: down to 3 cigarettes a month  Substance Use Topics  . Alcohol use: Yes    Alcohol/week: 0.0 standard drinks    Comment: 2-4 Beers Weekly  . Drug use: Yes    Types: Marijuana    Comment: last use 3 days ago    ALLERGIES:  has No Known Allergies.  MEDICATIONS:  Current Outpatient Medications  Medication Sig Dispense Refill  . albuterol (PROVENTIL HFA;VENTOLIN HFA) 108 (90 Base) MCG/ACT inhaler Inhale 2 puffs into the lungs every 6 (six) hours as needed for wheezing or shortness of breath. 1 Inhaler 2  . albuterol (PROVENTIL) (2.5 MG/3ML) 0.083% nebulizer solution Take 3 mLs (2.5 mg total) by nebulization every 6 (six) hours as needed for wheezing or shortness of breath. 75 mL 3  . Fluticasone-Salmeterol (ADVAIR DISKUS) 500-50 MCG/DOSE AEPB Inhale 1 puff into the lungs 2 (two) times daily. 1 each 3  . gabapentin (NEURONTIN) 100 MG capsule Take 1 capsule by mouth 3 (three) times daily.  0  . Multiple Vitamin (MULTI-VITAMINS) TABS Take 1 tablet by mouth daily.    . nicotine (NICODERM CQ - DOSED IN MG/24 HOURS) 14 mg/24hr patch Place 14 mg onto the skin daily.     Marland Kitchen oxyCODONE (OXYCONTIN) 10 mg 12 hr tablet Take 10 mg by mouth every 12 (twelve) hours.    . pregabalin (LYRICA) 25 MG capsule Take 1 capsule by mouth 2 (two) times daily.     No current facility-administered medications for this visit.    Facility-Administered Medications Ordered in Other Visits  Medication Dose Route Frequency Provider Last Rate Last Dose  . sodium chloride flush (NS) 0.9 % injection 10 mL  10 mL Intracatheter PRN Cammie Sickle, MD   10 mL at 05/26/15 1300  . sodium chloride flush (NS) 0.9 % injection 10 mL  10 mL Intravenous PRN Cammie Sickle, MD   10 mL at 07/07/15 1425    PHYSICAL EXAMINATION: ECOG PERFORMANCE STATUS: 0 - Asymptomatic  There were no vitals taken for this visit.  There were no vitals filed for this visit.  Physical Exam  Constitutional: He is  oriented to person, place, and time.  Thin built moderately nourished male patient no acute distress.  Is alone.  HENT:  Head: Normocephalic and atraumatic.  Mouth/Throat: Oropharynx is clear and moist. No oropharyngeal exudate.  Eyes: Pupils are equal, round, and reactive to light.  Neck: Normal range of motion. Neck supple.  Cardiovascular: Normal rate and regular rhythm.  Pulmonary/Chest: No respiratory distress. He has no wheezes.  Decreased breath sounds bil.   Abdominal: Soft. Bowel sounds are normal. He exhibits no distension and no mass. There is no abdominal tenderness. There is no rebound and no guarding.  Musculoskeletal: Normal range of motion.        General: No tenderness or edema.  Neurological: He is alert and oriented to person, place, and time.  Skin: Skin is warm.  Psychiatric: Affect normal.       LABORATORY DATA:  I have reviewed the data as listed  Component Value Date/Time   NA 135 05/16/2018 1243   NA 136 07/27/2014 0856   K 3.9 05/16/2018 1243   K 3.9 07/27/2014 0856   CL 100 05/16/2018 1243   CL 104 07/27/2014 0856   CO2 28 05/16/2018 1243   CO2 27 07/27/2014 0856   GLUCOSE 102 (H) 05/16/2018 1243   GLUCOSE 151 (H) 07/27/2014 0856   BUN 10 05/16/2018 1243   BUN 10 07/27/2014 0856   CREATININE 0.94 05/16/2018 1243   CREATININE 0.86 07/27/2014 0856   CREATININE 0.77 07/06/2014   CALCIUM 8.8 (L) 05/16/2018 1243   CALCIUM 8.6 (L) 07/27/2014 0856   PROT 7.9 05/16/2018 1243   PROT 8.8 (H) 06/28/2014 1512   ALBUMIN 4.2 05/16/2018 1243   ALBUMIN 4.0 06/28/2014 1512   AST 26 05/16/2018 1243   AST 37 06/28/2014 1512   ALT 14 05/16/2018 1243   ALT 43 06/28/2014 1512   ALKPHOS 30 (L) 05/16/2018 1243   ALKPHOS 53 06/28/2014 1512   BILITOT 1.2 05/16/2018 1243   BILITOT 0.3 06/28/2014 1512   GFRNONAA >60 05/16/2018 1243   GFRNONAA >60 07/27/2014 0856   GFRAA >60 05/16/2018 1243   GFRAA >60 07/27/2014 0856    No results found for: SPEP,  UPEP  Lab Results  Component Value Date   WBC 5.9 05/16/2018   NEUTROABS 3.5 05/16/2018   HGB 13.3 05/16/2018   HCT 42.1 05/16/2018   MCV 89.2 05/16/2018   PLT 230 05/16/2018      Chemistry      Component Value Date/Time   NA 135 05/16/2018 1243   NA 136 07/27/2014 0856   K 3.9 05/16/2018 1243   K 3.9 07/27/2014 0856   CL 100 05/16/2018 1243   CL 104 07/27/2014 0856   CO2 28 05/16/2018 1243   CO2 27 07/27/2014 0856   BUN 10 05/16/2018 1243   BUN 10 07/27/2014 0856   CREATININE 0.94 05/16/2018 1243   CREATININE 0.86 07/27/2014 0856   CREATININE 0.77 07/06/2014      Component Value Date/Time   CALCIUM 8.8 (L) 05/16/2018 1243   CALCIUM 8.6 (L) 07/27/2014 0856   ALKPHOS 30 (L) 05/16/2018 1243   ALKPHOS 53 06/28/2014 1512   AST 26 05/16/2018 1243   AST 37 06/28/2014 1512   ALT 14 05/16/2018 1243   ALT 43 06/28/2014 1512   BILITOT 1.2 05/16/2018 1243   BILITOT 0.3 06/28/2014 1512       RADIOGRAPHIC STUDIES: I have personally reviewed the radiological images as listed and agreed with the findings in the report. No results found.   ASSESSMENT & PLAN:  Cancer of upper lobe of right lung [facility]) #Recurrent adenocarcinoma of the lung-right upper lobe/status post right upper lobe nodule SBRT June 2018.  FEB 2020- STABLE RUL changes.   # COPD/cough phlegm- not controlled/ worse- sent scripts for albuterol/advair.   #Hepatitis C/status post treatment; resolved-stable.   # Chronic pain- unrelated to cancer follow up with pain clinic; stable.   #Active smoker- counseled to quit.  Stable.  # DISPOSITION:  # Follow up in 6 months- MD/labs-cbc/cmp CT scan chest prior-Dr.B  # I reviewed the blood work- with the patient in detail; also reviewed the imaging independently [as summarized above]; and with the patient in detail.       No orders of the defined types were placed in this encounter.  All questions were answered. The patient knows to call the clinic with any  problems, questions or concerns.  Cammie Sickle, MD 11/17/2018 11:59 AM

## 2019-01-05 ENCOUNTER — Encounter: Payer: Self-pay | Admitting: *Deleted

## 2019-03-02 ENCOUNTER — Telehealth: Payer: Self-pay | Admitting: *Deleted

## 2019-03-02 ENCOUNTER — Ambulatory Visit: Admission: RE | Admit: 2019-03-02 | Payer: Self-pay | Source: Ambulatory Visit

## 2019-03-02 NOTE — Telephone Encounter (Signed)
Attempted to reach patient today for pre screening calls. No answer "call can not be completed."  Unable to leave any msgs.  Pt no showed for his ct scan.

## 2019-03-03 ENCOUNTER — Inpatient Hospital Stay: Payer: Self-pay | Admitting: Internal Medicine

## 2019-03-03 ENCOUNTER — Inpatient Hospital Stay: Payer: Self-pay | Attending: Internal Medicine

## 2019-03-03 NOTE — Assessment & Plan Note (Deleted)
#  Recurrent adenocarcinoma of the lung-right upper lobe/status post right upper lobe nodule SBRT June 2018.  FEB 2020- STABLE RUL changes.   # COPD/cough phlegm- not controlled/ worse- sent scripts for albuterol/advair.   #Hepatitis C/status post treatment; resolved-stable.   # Chronic pain- unrelated to cancer follow up with pain clinic; stable.   #Active smoker- counseled to quit.  Stable.  # DISPOSITION:  # Follow up in 6 months- MD/labs-cbc/cmp CT scan chest prior-Dr.B  # I reviewed the blood work- with the patient in detail; also reviewed the imaging independently [as summarized above]; and with the patient in detail.

## 2019-03-03 NOTE — Progress Notes (Deleted)
[place] OFFICE PROGRESS NOTE  Patient Care Team: Tracie Harrier, MD as PCP - General (Internal Medicine)  Cancer Staging No matching staging information was found for the patient.   Oncology History Overview Note  # MARCH 2016- LUNG CA- RUL- ADENO CA [mol testing-NA sec to poor samples] STAGE III s/p chemo-RT; [SEP 2016-CT Bx for mol testing; neg for malignancy]; CT-29th  NOV 2016-  Stable/ improved right upper lobe lesion;   # RECURRENT- CT JAN 2017- Stable right upper lobe lung nodule ~ 2.5cm; but progressive RML ~1.8cm; Start Tecentriq q 3W x3 ; March 30th CT 2017- Improved Lung Lesions; RML- 1.6x1.0; RUL ~3cm.; AUG 2nd 2017-CT stable.   # April/May 2018- RUL- local Progression on CT/PET- referral to rad-Onc  # July 2017 Elevated LFTs- likely from Hepatitis C [resolved on treatment- July 2018]; NOT from Tecentriq.  # Hep C [june 2018; treatment at [facility]]  # Chronic pain- on oxycodone.jan 2017-Bone scan- neg.  ---------------------------------------------------------------------------------    DIAGNOSIS: [ ]  RECURRENT ADENO CA LUNG  STAGE:  III   ; GOALS: CONTROL  CURRENT/MOST RECENT THERAPY [ ]  surveillance; last tecentrriq       Cancer of upper lobe of right lung (HCC)      INTERVAL HISTORY:  Mario [name] 58 y.o.  male pleasant patient above history of recurrent stage III adenocarcinoma the lung is here for follow-up/review the results of  CT scan.  Patient complains of chronic cough chronic shortness of breath.  Phlegm especially mornings.  No hemoptysis.  He ran out office inhalers.  History of chronic joint pains; back pain for which he follows up in the pain clinic.   Review of Systems  Constitutional: Negative for chills, diaphoresis, fever, malaise/fatigue and weight loss.  HENT: Negative for nosebleeds and sore throat.   Eyes: Negative for double vision.  Respiratory: Positive for cough and shortness of breath. Negative for  hemoptysis, sputum production and wheezing.   Cardiovascular: Negative for chest pain, palpitations, orthopnea and leg swelling.  Gastrointestinal: Negative for abdominal pain, blood in stool, constipation, diarrhea, heartburn, melena, nausea and vomiting.  Genitourinary: Negative for dysuria, frequency and urgency.  Musculoskeletal: Positive for back pain and joint pain.  Skin: Negative.  Negative for itching and rash.  Neurological: Negative for dizziness, tingling, focal weakness, weakness and headaches.  Endo/Heme/Allergies: Does not bruise/bleed easily.  Psychiatric/Behavioral: Negative for depression. The patient is not nervous/anxious and does not have insomnia.       PAST MEDICAL HISTORY :  Past Medical History:  Diagnosis Date  . Arthritis   . Cancer of upper lobe of right lung ([place]) 09/19/2015  . Chicken pox   . Chronic pain 09/19/2015  . Current tobacco use 05/26/2014  . Elevated LFTs 09/19/2015  . Fracture of lower extremity 09/19/2015  . Kidney stones   . Lung cancer ([place]) 2015  . OA (osteoarthritis) of knee 05/26/2014  . Status post chemotherapy     PAST SURGICAL HISTORY :   Past Surgical History:  Procedure Laterality Date  . amputation of index finger Right 2010  . HAND SURGERY Right 2010   ORIF of right index finder, 2nd carpometacarpal joint dislocation right hand, proximal right humerus  . ORIF FEMUR FRACTURE Right 2010  . PORT-A-CATH REMOVAL N/A 01/15/2017   Procedure: REMOVAL PORT-A-CATH;  Surgeon: Nestor Lewandowsky, MD;  Location: [facility] ORS;  Service: General;  Laterality: N/A;  . SHOULDER SURGERY Right 2010   pins and rods in shoulder    FAMILY  HISTORY :   Family History  Problem Relation Age of Onset  . Hypertension Mother   . Hypertension Father     SOCIAL HISTORY:   Social History   Tobacco Use  . Smoking status: Current Every Day Smoker    Packs/day: 2.00    Years: 20.00    Pack years: 40.00    Types: Cigarettes  . Smokeless tobacco: Never Used   . Tobacco comment: down to 3 cigarettes a month  Substance Use Topics  . Alcohol use: Yes    Alcohol/week: 0.0 standard drinks    Comment: 2-4 Beers Weekly  . Drug use: Yes    Types: Marijuana    Comment: last use 3 days ago    ALLERGIES:  has No Known Allergies.  MEDICATIONS:  Current Outpatient Medications  Medication Sig Dispense Refill  . albuterol (PROVENTIL HFA;VENTOLIN HFA) 108 (90 Base) MCG/ACT inhaler Inhale 2 puffs into the lungs every 6 (six) hours as needed for wheezing or shortness of breath. 1 Inhaler 2  . albuterol (PROVENTIL) (2.5 MG/3ML) 0.083% nebulizer solution Take 3 mLs (2.5 mg total) by nebulization every 6 (six) hours as needed for wheezing or shortness of breath. 75 mL 3  . Fluticasone-Salmeterol (ADVAIR DISKUS) 500-50 MCG/DOSE AEPB Inhale 1 puff into the lungs 2 (two) times daily. 1 each 3  . gabapentin (NEURONTIN) 100 MG capsule Take 1 capsule by mouth 3 (three) times daily.  0  . Multiple Vitamin (MULTI-VITAMINS) TABS Take 1 tablet by mouth daily.    . nicotine (NICODERM CQ - DOSED IN MG/24 HOURS) 14 mg/24hr patch Place 14 mg onto the skin daily.     Marland Kitchen oxyCODONE (OXYCONTIN) 10 mg 12 hr tablet Take 10 mg by mouth every 12 (twelve) hours.    . pregabalin (LYRICA) 25 MG capsule Take 1 capsule by mouth 2 (two) times daily.     No current facility-administered medications for this visit.    Facility-Administered Medications Ordered in Other Visits  Medication Dose Route Frequency Provider Last Rate Last Dose  . sodium chloride flush (NS) 0.9 % injection 10 mL  10 mL Intracatheter PRN Cammie Sickle, MD   10 mL at 05/26/15 1300  . sodium chloride flush (NS) 0.9 % injection 10 mL  10 mL Intravenous PRN Cammie Sickle, MD   10 mL at 07/07/15 1425    PHYSICAL EXAMINATION: ECOG PERFORMANCE STATUS: 0 - Asymptomatic  There were no vitals taken for this visit.  There were no vitals filed for this visit.  Physical Exam  Constitutional: He is  oriented to person, place, and time.  Thin built moderately nourished male patient no acute distress.  Is alone.  HENT:  Head: Normocephalic and atraumatic.  Mouth/Throat: Oropharynx is clear and moist. No oropharyngeal exudate.  Eyes: Pupils are equal, round, and reactive to light.  Neck: Normal range of motion. Neck supple.  Cardiovascular: Normal rate and regular rhythm.  Pulmonary/Chest: No respiratory distress. He has no wheezes.  Decreased breath sounds bil.   Abdominal: Soft. Bowel sounds are normal. He exhibits no distension and no mass. There is no abdominal tenderness. There is no rebound and no guarding.  Musculoskeletal: Normal range of motion.        General: No tenderness or edema.  Neurological: He is alert and oriented to person, place, and time.  Skin: Skin is warm.  Psychiatric: Affect normal.       LABORATORY DATA:  I have reviewed the data as listed  Component Value Date/Time   NA 135 05/16/2018 1243   NA 136 07/27/2014 0856   K 3.9 05/16/2018 1243   K 3.9 07/27/2014 0856   CL 100 05/16/2018 1243   CL 104 07/27/2014 0856   CO2 28 05/16/2018 1243   CO2 27 07/27/2014 0856   GLUCOSE 102 (H) 05/16/2018 1243   GLUCOSE 151 (H) 07/27/2014 0856   BUN 10 05/16/2018 1243   BUN 10 07/27/2014 0856   CREATININE 0.94 05/16/2018 1243   CREATININE 0.86 07/27/2014 0856   CREATININE 0.77 07/06/2014   CALCIUM 8.8 (L) 05/16/2018 1243   CALCIUM 8.6 (L) 07/27/2014 0856   PROT 7.9 05/16/2018 1243   PROT 8.8 (H) 06/28/2014 1512   ALBUMIN 4.2 05/16/2018 1243   ALBUMIN 4.0 06/28/2014 1512   AST 26 05/16/2018 1243   AST 37 06/28/2014 1512   ALT 14 05/16/2018 1243   ALT 43 06/28/2014 1512   ALKPHOS 30 (L) 05/16/2018 1243   ALKPHOS 53 06/28/2014 1512   BILITOT 1.2 05/16/2018 1243   BILITOT 0.3 06/28/2014 1512   GFRNONAA >60 05/16/2018 1243   GFRNONAA >60 07/27/2014 0856   GFRAA >60 05/16/2018 1243   GFRAA >60 07/27/2014 0856    No results found for: SPEP,  UPEP  Lab Results  Component Value Date   WBC 5.9 05/16/2018   NEUTROABS 3.5 05/16/2018   HGB 13.3 05/16/2018   HCT 42.1 05/16/2018   MCV 89.2 05/16/2018   PLT 230 05/16/2018      Chemistry      Component Value Date/Time   NA 135 05/16/2018 1243   NA 136 07/27/2014 0856   K 3.9 05/16/2018 1243   K 3.9 07/27/2014 0856   CL 100 05/16/2018 1243   CL 104 07/27/2014 0856   CO2 28 05/16/2018 1243   CO2 27 07/27/2014 0856   BUN 10 05/16/2018 1243   BUN 10 07/27/2014 0856   CREATININE 0.94 05/16/2018 1243   CREATININE 0.86 07/27/2014 0856   CREATININE 0.77 07/06/2014      Component Value Date/Time   CALCIUM 8.8 (L) 05/16/2018 1243   CALCIUM 8.6 (L) 07/27/2014 0856   ALKPHOS 30 (L) 05/16/2018 1243   ALKPHOS 53 06/28/2014 1512   AST 26 05/16/2018 1243   AST 37 06/28/2014 1512   ALT 14 05/16/2018 1243   ALT 43 06/28/2014 1512   BILITOT 1.2 05/16/2018 1243   BILITOT 0.3 06/28/2014 1512       RADIOGRAPHIC STUDIES: I have personally reviewed the radiological images as listed and agreed with the findings in the report. No results found.   ASSESSMENT & PLAN:  No problem-specific Assessment & Plan notes found for this encounter.   No orders of the defined types were placed in this encounter.  All questions were answered. The patient knows to call the clinic with any problems, questions or concerns.      Cammie Sickle, MD 03/03/2019 8:02 AM

## 2019-04-13 ENCOUNTER — Inpatient Hospital Stay (HOSPITAL_BASED_OUTPATIENT_CLINIC_OR_DEPARTMENT_OTHER): Payer: Self-pay | Admitting: Internal Medicine

## 2019-04-13 ENCOUNTER — Other Ambulatory Visit: Payer: Self-pay

## 2019-04-13 ENCOUNTER — Inpatient Hospital Stay: Payer: Self-pay | Attending: Internal Medicine

## 2019-04-13 DIAGNOSIS — R0602 Shortness of breath: Secondary | ICD-10-CM | POA: Insufficient documentation

## 2019-04-13 DIAGNOSIS — Z87442 Personal history of urinary calculi: Secondary | ICD-10-CM | POA: Insufficient documentation

## 2019-04-13 DIAGNOSIS — F1290 Cannabis use, unspecified, uncomplicated: Secondary | ICD-10-CM | POA: Insufficient documentation

## 2019-04-13 DIAGNOSIS — Z79899 Other long term (current) drug therapy: Secondary | ICD-10-CM | POA: Insufficient documentation

## 2019-04-13 DIAGNOSIS — R05 Cough: Secondary | ICD-10-CM | POA: Insufficient documentation

## 2019-04-13 DIAGNOSIS — M542 Cervicalgia: Secondary | ICD-10-CM | POA: Insufficient documentation

## 2019-04-13 DIAGNOSIS — M25519 Pain in unspecified shoulder: Secondary | ICD-10-CM | POA: Insufficient documentation

## 2019-04-13 DIAGNOSIS — Z8249 Family history of ischemic heart disease and other diseases of the circulatory system: Secondary | ICD-10-CM | POA: Insufficient documentation

## 2019-04-13 DIAGNOSIS — C3411 Malignant neoplasm of upper lobe, right bronchus or lung: Secondary | ICD-10-CM

## 2019-04-13 DIAGNOSIS — F17210 Nicotine dependence, cigarettes, uncomplicated: Secondary | ICD-10-CM | POA: Insufficient documentation

## 2019-04-13 DIAGNOSIS — G8929 Other chronic pain: Secondary | ICD-10-CM | POA: Insufficient documentation

## 2019-04-13 LAB — CBC WITH DIFFERENTIAL/PLATELET
Abs Immature Granulocytes: 0.01 10*3/uL (ref 0.00–0.07)
Basophils Absolute: 0 10*3/uL (ref 0.0–0.1)
Basophils Relative: 0 %
Eosinophils Absolute: 0 10*3/uL (ref 0.0–0.5)
Eosinophils Relative: 0 %
HCT: 40.8 % (ref 39.0–52.0)
Hemoglobin: 12.8 g/dL — ABNORMAL LOW (ref 13.0–17.0)
Immature Granulocytes: 0 %
Lymphocytes Relative: 23 %
Lymphs Abs: 1.3 10*3/uL (ref 0.7–4.0)
MCH: 28.4 pg (ref 26.0–34.0)
MCHC: 31.4 g/dL (ref 30.0–36.0)
MCV: 90.5 fL (ref 80.0–100.0)
Monocytes Absolute: 0.7 10*3/uL (ref 0.1–1.0)
Monocytes Relative: 13 %
Neutro Abs: 3.5 10*3/uL (ref 1.7–7.7)
Neutrophils Relative %: 64 %
Platelets: 185 10*3/uL (ref 150–400)
RBC: 4.51 MIL/uL (ref 4.22–5.81)
RDW: 13.1 % (ref 11.5–15.5)
WBC: 5.5 10*3/uL (ref 4.0–10.5)
nRBC: 0 % (ref 0.0–0.2)

## 2019-04-13 LAB — COMPREHENSIVE METABOLIC PANEL
ALT: 16 U/L (ref 0–44)
AST: 30 U/L (ref 15–41)
Albumin: 4.3 g/dL (ref 3.5–5.0)
Alkaline Phosphatase: 37 U/L — ABNORMAL LOW (ref 38–126)
Anion gap: 10 (ref 5–15)
BUN: 16 mg/dL (ref 6–20)
CO2: 26 mmol/L (ref 22–32)
Calcium: 8.7 mg/dL — ABNORMAL LOW (ref 8.9–10.3)
Chloride: 101 mmol/L (ref 98–111)
Creatinine, Ser: 1.14 mg/dL (ref 0.61–1.24)
GFR calc Af Amer: >60 mL/min (ref >60)
GFR calc non Af Amer: >60 mL/min (ref >60)
Glucose, Bld: 141 mg/dL — ABNORMAL HIGH (ref 70–99)
Potassium: 4 mmol/L (ref 3.5–5.1)
Sodium: 137 mmol/L (ref 135–145)
Total Bilirubin: 1 mg/dL (ref 0.3–1.2)
Total Protein: 7.9 g/dL (ref 6.5–8.1)

## 2019-04-13 MED ORDER — ALBUTEROL SULFATE HFA 108 (90 BASE) MCG/ACT IN AERS: 2.0000 | INHALATION_SPRAY | Freq: Four times a day (QID) | RESPIRATORY_TRACT | 2 refills | Status: DC | PRN

## 2019-04-13 MED ORDER — FLUTICASONE-SALMETEROL 500-50 MCG/DOSE IN AEPB: 1.0000 | INHALATION_SPRAY | Freq: Two times a day (BID) | RESPIRATORY_TRACT | 3 refills | Status: DC

## 2019-04-13 NOTE — Progress Notes (Signed)
[place] OFFICE PROGRESS NOTE  Patient Care Team: Tracie Harrier, MD as PCP - General (Internal Medicine)  Cancer Staging No matching staging information was found for the patient.   Oncology History Overview Note  # MARCH 2016- LUNG CA- RUL- ADENO CA [mol testing-NA sec to poor samples] STAGE III s/p chemo-RT; [SEP 2016-CT Bx for mol testing; neg for malignancy]; CT-29th  NOV 2016-  Stable/ improved right upper lobe lesion;   # RECURRENT- CT JAN 2017- Stable right upper lobe lung nodule ~ 2.5cm; but progressive RML ~1.8cm; Start Tecentriq q 3W x3 ; March 30th CT 2017- Improved Lung Lesions; RML- 1.6x1.0; RUL ~3cm.; AUG 2nd 2017-CT stable.   # April/May 2018- RUL- local Progression on CT/PET- referral to rad-Onc  # July 2017 Elevated LFTs- likely from Hepatitis C [resolved on treatment- July 2018]; NOT from Tecentriq.  # Hep C [june 2018; treatment at [facility]]  # Chronic pain- on oxycodone.jan 2017-Bone scan- neg.  ---------------------------------------------------------------------------------    DIAGNOSIS: [ ]  RECURRENT ADENO CA LUNG  STAGE:  III   ; GOALS: CONTROL  CURRENT/MOST RECENT THERAPY [ ]  surveillance; last tecentrriq       Cancer of upper lobe of right lung (HCC)      INTERVAL HISTORY:  Mario [name] 59 y.o.  male pleasant patient above history of recurrent stage III adenocarcinoma the lung is here for follow-up.  Patient has missed multiple appointments recently.  Continues to chronic neck pain shoulder pain.   Complains of cough and shortness of breath and phlegm especially mornings.  He has run out of his inhalers.  Review of Systems  Constitutional: Negative for chills, diaphoresis, fever, malaise/fatigue and weight loss.  HENT: Negative for nosebleeds and sore throat.   Eyes: Negative for double vision.  Respiratory: Positive for cough, sputum production and shortness of breath. Negative for hemoptysis and wheezing.    Cardiovascular: Negative for chest pain, palpitations, orthopnea and leg swelling.  Gastrointestinal: Negative for abdominal pain, blood in stool, constipation, diarrhea, heartburn, melena, nausea and vomiting.  Genitourinary: Negative for dysuria, frequency and urgency.  Musculoskeletal: Positive for back pain and joint pain.  Skin: Negative.  Negative for itching and rash.  Neurological: Negative for dizziness, tingling, focal weakness, weakness and headaches.  Endo/Heme/Allergies: Does not bruise/bleed easily.  Psychiatric/Behavioral: Negative for depression. The patient is not nervous/anxious and does not have insomnia.       PAST MEDICAL HISTORY :  Past Medical History:  Diagnosis Date  . Arthritis   . Cancer of upper lobe of right lung ([place]) 09/19/2015  . Chicken pox   . Chronic pain 09/19/2015  . Current tobacco use 05/26/2014  . Elevated LFTs 09/19/2015  . Fracture of lower extremity 09/19/2015  . Kidney stones   . Lung cancer ([place]) 2015  . OA (osteoarthritis) of knee 05/26/2014  . Status post chemotherapy     PAST SURGICAL HISTORY :   Past Surgical History:  Procedure Laterality Date  . amputation of index finger Right 2010  . HAND SURGERY Right 2010   ORIF of right index finder, 2nd carpometacarpal joint dislocation right hand, proximal right humerus  . ORIF FEMUR FRACTURE Right 2010  . PORT-A-CATH REMOVAL N/A 01/15/2017   Procedure: REMOVAL PORT-A-CATH;  Surgeon: Nestor Lewandowsky, MD;  Location: [facility] ORS;  Service: General;  Laterality: N/A;  . SHOULDER SURGERY Right 2010   pins and rods in shoulder    FAMILY HISTORY :   Family History  Problem Relation Age of  Onset  . Hypertension Mother   . Hypertension Father     SOCIAL HISTORY:   Social History   Tobacco Use  . Smoking status: Current Every Day Smoker    Packs/day: 2.00    Years: 20.00    Pack years: 40.00    Types: Cigarettes  . Smokeless tobacco: Never Used  . Tobacco comment: down to 3 cigarettes a  month  Substance Use Topics  . Alcohol use: Yes    Alcohol/week: 0.0 standard drinks    Comment: 2-4 Beers Weekly  . Drug use: Yes    Types: Marijuana    Comment: last use 3 days ago    ALLERGIES:  has No Known Allergies.  MEDICATIONS:  Current Outpatient Medications  Medication Sig Dispense Refill  . albuterol (PROVENTIL) (2.5 MG/3ML) 0.083% nebulizer solution Take 3 mLs (2.5 mg total) by nebulization every 6 (six) hours as needed for wheezing or shortness of breath. 75 mL 3  . albuterol (VENTOLIN HFA) 108 (90 Base) MCG/ACT inhaler Inhale 2 puffs into the lungs every 6 (six) hours as needed for wheezing or shortness of breath. 8 g 2  . Fluticasone-Salmeterol (ADVAIR DISKUS) 500-50 MCG/DOSE AEPB Inhale 1 puff into the lungs 2 (two) times daily. 1 each 3  . Multiple Vitamin (MULTI-VITAMINS) TABS Take 1 tablet by mouth daily.    . pregabalin (LYRICA) 25 MG capsule Take 1 capsule by mouth 2 (two) times daily.    Marland Kitchen gabapentin (NEURONTIN) 100 MG capsule Take 1 capsule by mouth 3 (three) times daily.  0  . nicotine (NICODERM CQ - DOSED IN MG/24 HOURS) 14 mg/24hr patch Place 14 mg onto the skin daily.     Marland Kitchen oxyCODONE (OXYCONTIN) 10 mg 12 hr tablet Take 10 mg by mouth every 12 (twelve) hours.     No current facility-administered medications for this visit.   Facility-Administered Medications Ordered in Other Visits  Medication Dose Route Frequency Provider Last Rate Last Admin  . sodium chloride flush (NS) 0.9 % injection 10 mL  10 mL Intracatheter PRN Cammie Sickle, MD   10 mL at 05/26/15 1300  . sodium chloride flush (NS) 0.9 % injection 10 mL  10 mL Intravenous PRN Cammie Sickle, MD   10 mL at 07/07/15 1425    PHYSICAL EXAMINATION: ECOG PERFORMANCE STATUS: 0 - Asymptomatic  BP (!) 150/86 (BP Location: Right Arm, Patient Position: Sitting, Cuff Size: Normal)   Pulse 73   Temp (!) 95.2 F (35.1 C) (Tympanic)   Wt 115 lb (52.2 kg)   SpO2 99%   BMI 16.98 kg/m    Filed Weights   04/13/19 1459  Weight: 115 lb (52.2 kg)    Physical Exam  Constitutional: He is oriented to person, place, and time.  Thin built moderately nourished male patient no acute distress.  Is alone.  HENT:  Head: Normocephalic and atraumatic.  Mouth/Throat: Oropharynx is clear and moist. No oropharyngeal exudate.  Eyes: Pupils are equal, round, and reactive to light.  Cardiovascular: Normal rate and regular rhythm.  Pulmonary/Chest: No respiratory distress. He has no wheezes.  Decreased breath sounds bil.   Abdominal: Soft. Bowel sounds are normal. He exhibits no distension and no mass. There is no abdominal tenderness. There is no rebound and no guarding.  Musculoskeletal:        General: No tenderness or edema. Normal range of motion.     Cervical back: Normal range of motion and neck supple.  Neurological: He is alert and oriented  to person, place, and time.  Skin: Skin is warm.  Psychiatric: Affect normal.       LABORATORY DATA:  I have reviewed the data as listed    Component Value Date/Time   NA 137 04/13/2019 1443   NA 136 07/27/2014 0856   K 4.0 04/13/2019 1443   K 3.9 07/27/2014 0856   CL 101 04/13/2019 1443   CL 104 07/27/2014 0856   CO2 26 04/13/2019 1443   CO2 27 07/27/2014 0856   GLUCOSE 141 (H) 04/13/2019 1443   GLUCOSE 151 (H) 07/27/2014 0856   BUN 16 04/13/2019 1443   BUN 10 07/27/2014 0856   CREATININE 1.14 04/13/2019 1443   CREATININE 0.86 07/27/2014 0856   CREATININE 0.77 07/06/2014 0000   CALCIUM 8.7 (L) 04/13/2019 1443   CALCIUM 8.6 (L) 07/27/2014 0856   PROT 7.9 04/13/2019 1443   PROT 8.8 (H) 06/28/2014 1512   ALBUMIN 4.3 04/13/2019 1443   ALBUMIN 4.0 06/28/2014 1512   AST 30 04/13/2019 1443   AST 37 06/28/2014 1512   ALT 16 04/13/2019 1443   ALT 43 06/28/2014 1512   ALKPHOS 37 (L) 04/13/2019 1443   ALKPHOS 53 06/28/2014 1512   BILITOT 1.0 04/13/2019 1443   BILITOT 0.3 06/28/2014 1512   GFRNONAA >60 04/13/2019 1443    GFRNONAA >60 07/27/2014 0856   GFRAA >60 04/13/2019 1443   GFRAA >60 07/27/2014 0856    No results found for: SPEP, UPEP  Lab Results  Component Value Date   WBC 5.5 04/13/2019   NEUTROABS 3.5 04/13/2019   HGB 12.8 (L) 04/13/2019   HCT 40.8 04/13/2019   MCV 90.5 04/13/2019   PLT 185 04/13/2019      Chemistry      Component Value Date/Time   NA 137 04/13/2019 1443   NA 136 07/27/2014 0856   K 4.0 04/13/2019 1443   K 3.9 07/27/2014 0856   CL 101 04/13/2019 1443   CL 104 07/27/2014 0856   CO2 26 04/13/2019 1443   CO2 27 07/27/2014 0856   BUN 16 04/13/2019 1443   BUN 10 07/27/2014 0856   CREATININE 1.14 04/13/2019 1443   CREATININE 0.86 07/27/2014 0856   CREATININE 0.77 07/06/2014 0000      Component Value Date/Time   CALCIUM 8.7 (L) 04/13/2019 1443   CALCIUM 8.6 (L) 07/27/2014 0856   ALKPHOS 37 (L) 04/13/2019 1443   ALKPHOS 53 06/28/2014 1512   AST 30 04/13/2019 1443   AST 37 06/28/2014 1512   ALT 16 04/13/2019 1443   ALT 43 06/28/2014 1512   BILITOT 1.0 04/13/2019 1443   BILITOT 0.3 06/28/2014 1512       RADIOGRAPHIC STUDIES: I have personally reviewed the radiological images as listed and agreed with the findings in the report. No results found.   ASSESSMENT & PLAN:  Cancer of upper lobe of right lung [facility]) #Recurrent adenocarcinoma of the lung-right upper lobe/status post right upper lobe nodule SBRT June 2018.  FEB 2020- STABLE RUL changes.  Clinically stable.  Recommend evaluation of the CT scan.  # COPD/cough phlegm-/worse.  Sent scripts for albuterol/advair.   # Chronic pain- unrelated to cancer follow up with pain clinic; stable  # Active smoker- counseled to quit.  Stable.  # DISPOSITION:  # CT scan chest in 1 week- will call with results # Follow up in 6 months- MD/labs-cbc/cmp/-Dr.B       Orders Placed This Encounter  Procedures  . CT Chest W Contrast    Standing  Status:   Future    Standing Expiration Date:   04/12/2020    Order  Specific Question:   If indicated for the ordered procedure, I authorize the administration of contrast media per Radiology protocol    Answer:   Yes    Order Specific Question:   Preferred imaging location?    Answer:   [facility]    Order Specific Question:   Radiology Contrast Protocol - do NOT remove file path    Answer:   [email or web address]    Order Specific Question:   ** REASON FOR EXAM (FREE TEXT)    Answer:   lung cancer  . CBC with Differential    Standing Status:   Future    Standing Expiration Date:   04/12/2020  . Comprehensive metabolic panel    Standing Status:   Future    Standing Expiration Date:   04/12/2020  . CBC with Differential    Standing Status:   Future    Standing Expiration Date:   04/12/2020  . Comprehensive metabolic panel    Standing Status:   Future    Standing Expiration Date:   04/12/2020   All questions were answered. The patient knows to call the clinic with any problems, questions or concerns.      Cammie Sickle, MD 04/15/2019 8:06 AM

## 2019-04-13 NOTE — Assessment & Plan Note (Addendum)
#  Recurrent adenocarcinoma of the lung-right upper lobe/status post right upper lobe nodule SBRT June 2018.  FEB 2020- STABLE RUL changes.  Clinically stable.  Recommend evaluation of the CT scan.  # COPD/cough phlegm-/worse.  Sent scripts for albuterol/advair.   # Chronic pain- unrelated to cancer follow up with pain clinic; stable  # Active smoker- counseled to quit.  Stable.  # DISPOSITION:  # CT scan chest in 1 week- will call with results # Follow up in 6 months- MD/labs-cbc/cmp/-Dr.B

## 2019-04-22 ENCOUNTER — Other Ambulatory Visit: Payer: Self-pay

## 2019-04-22 ENCOUNTER — Ambulatory Visit
Admission: RE | Admit: 2019-04-22 | Discharge: 2019-04-22 | Disposition: A | Discharge status: Home / Self Care | Payer: Self-pay | Source: Ambulatory Visit | Attending: Internal Medicine | Admitting: Internal Medicine

## 2019-04-22 DIAGNOSIS — C3411 Malignant neoplasm of upper lobe, right bronchus or lung: Secondary | ICD-10-CM | POA: Insufficient documentation

## 2019-04-22 MED ORDER — IOHEXOL 300 MG/ML  SOLN
75.0000 mL | Freq: Once | INTRAMUSCULAR | Status: AC | PRN
  Administered 2019-04-22: 75 mL via INTRAVENOUS

## 2019-04-23 ENCOUNTER — Telehealth: Payer: Self-pay | Admitting: Internal Medicine

## 2019-04-23 DIAGNOSIS — C3411 Malignant neoplasm of upper lobe, right bronchus or lung: Secondary | ICD-10-CM

## 2019-04-23 NOTE — Telephone Encounter (Signed)
Attempted to reach patient [phone]- phone just rings. Unable to reach patient.

## 2019-04-23 NOTE — Telephone Encounter (Signed)
On 1/20- I called however phone number listed not working; unable to reach pt.  H/T-please inform patient that CT scan shows radiation changes in the lung; no evidence of cancer.  Recommend follow-up CT scan/in 6 months-prior to next visit. CT is ordered; please schedule.   Thanks GB

## 2019-10-07 ENCOUNTER — Other Ambulatory Visit: Payer: Self-pay

## 2019-10-07 ENCOUNTER — Ambulatory Visit
Admission: RE | Admit: 2019-10-07 | Discharge: 2019-10-07 | Disposition: A | Discharge status: Home / Self Care | Payer: Self-pay | Source: Ambulatory Visit | Attending: Internal Medicine | Admitting: Internal Medicine

## 2019-10-07 DIAGNOSIS — C3411 Malignant neoplasm of upper lobe, right bronchus or lung: Secondary | ICD-10-CM | POA: Insufficient documentation

## 2019-10-07 MED ORDER — IOHEXOL 300 MG/ML  SOLN
75.0000 mL | Freq: Once | INTRAMUSCULAR | Status: AC | PRN
  Administered 2019-10-07: 75 mL via INTRAVENOUS

## 2019-10-12 ENCOUNTER — Inpatient Hospital Stay (HOSPITAL_BASED_OUTPATIENT_CLINIC_OR_DEPARTMENT_OTHER): Payer: Self-pay | Admitting: Internal Medicine

## 2019-10-12 ENCOUNTER — Other Ambulatory Visit: Payer: Self-pay

## 2019-10-12 ENCOUNTER — Inpatient Hospital Stay: Payer: Self-pay | Attending: Internal Medicine

## 2019-10-12 DIAGNOSIS — M542 Cervicalgia: Secondary | ICD-10-CM | POA: Insufficient documentation

## 2019-10-12 DIAGNOSIS — R05 Cough: Secondary | ICD-10-CM | POA: Insufficient documentation

## 2019-10-12 DIAGNOSIS — G8929 Other chronic pain: Secondary | ICD-10-CM | POA: Insufficient documentation

## 2019-10-12 DIAGNOSIS — R0602 Shortness of breath: Secondary | ICD-10-CM | POA: Insufficient documentation

## 2019-10-12 DIAGNOSIS — M549 Dorsalgia, unspecified: Secondary | ICD-10-CM | POA: Insufficient documentation

## 2019-10-12 DIAGNOSIS — Z79899 Other long term (current) drug therapy: Secondary | ICD-10-CM | POA: Insufficient documentation

## 2019-10-12 DIAGNOSIS — Z87442 Personal history of urinary calculi: Secondary | ICD-10-CM | POA: Insufficient documentation

## 2019-10-12 DIAGNOSIS — Z8249 Family history of ischemic heart disease and other diseases of the circulatory system: Secondary | ICD-10-CM | POA: Insufficient documentation

## 2019-10-12 DIAGNOSIS — F17210 Nicotine dependence, cigarettes, uncomplicated: Secondary | ICD-10-CM | POA: Insufficient documentation

## 2019-10-12 DIAGNOSIS — C3411 Malignant neoplasm of upper lobe, right bronchus or lung: Secondary | ICD-10-CM

## 2019-10-12 LAB — CBC WITH DIFFERENTIAL/PLATELET
Abs Immature Granulocytes: 0.01 10*3/uL (ref 0.00–0.07)
Basophils Absolute: 0 10*3/uL (ref 0.0–0.1)
Basophils Relative: 0 %
Eosinophils Absolute: 0 10*3/uL (ref 0.0–0.5)
Eosinophils Relative: 1 %
HCT: 36.7 % — ABNORMAL LOW (ref 39.0–52.0)
Hemoglobin: 12.2 g/dL — ABNORMAL LOW (ref 13.0–17.0)
Immature Granulocytes: 0 %
Lymphocytes Relative: 34 %
Lymphs Abs: 1.6 10*3/uL (ref 0.7–4.0)
MCH: 29.2 pg (ref 26.0–34.0)
MCHC: 33.2 g/dL (ref 30.0–36.0)
MCV: 87.8 fL (ref 80.0–100.0)
Monocytes Absolute: 0.6 10*3/uL (ref 0.1–1.0)
Monocytes Relative: 12 %
Neutro Abs: 2.4 10*3/uL (ref 1.7–7.7)
Neutrophils Relative %: 53 %
Platelets: 237 10*3/uL (ref 150–400)
RBC: 4.18 MIL/uL — ABNORMAL LOW (ref 4.22–5.81)
RDW: 13.2 % (ref 11.5–15.5)
WBC: 4.6 10*3/uL (ref 4.0–10.5)
nRBC: 0 % (ref 0.0–0.2)

## 2019-10-12 LAB — COMPREHENSIVE METABOLIC PANEL
ALT: 12 U/L (ref 0–44)
AST: 24 U/L (ref 15–41)
Albumin: 3.7 g/dL (ref 3.5–5.0)
Alkaline Phosphatase: 30 U/L — ABNORMAL LOW (ref 38–126)
Anion gap: 7 (ref 5–15)
BUN: 14 mg/dL (ref 6–20)
CO2: 27 mmol/L (ref 22–32)
Calcium: 8.4 mg/dL — ABNORMAL LOW (ref 8.9–10.3)
Chloride: 102 mmol/L (ref 98–111)
Creatinine, Ser: 1.09 mg/dL (ref 0.61–1.24)
GFR calc Af Amer: >60 mL/min (ref >60)
GFR calc non Af Amer: >60 mL/min (ref >60)
Glucose, Bld: 211 mg/dL — ABNORMAL HIGH (ref 70–99)
Potassium: 4.5 mmol/L (ref 3.5–5.1)
Sodium: 136 mmol/L (ref 135–145)
Total Bilirubin: 0.8 mg/dL (ref 0.3–1.2)
Total Protein: 7.3 g/dL (ref 6.5–8.1)

## 2019-10-12 MED ORDER — ALBUTEROL SULFATE HFA 108 (90 BASE) MCG/ACT IN AERS: 2.0000 | INHALATION_SPRAY | Freq: Four times a day (QID) | RESPIRATORY_TRACT | 2 refills | Status: DC | PRN

## 2019-10-12 MED ORDER — FLUTICASONE-SALMETEROL 500-50 MCG/DOSE IN AEPB: 1.0000 | INHALATION_SPRAY | Freq: Two times a day (BID) | RESPIRATORY_TRACT | 3 refills | Status: AC

## 2019-10-12 NOTE — Progress Notes (Signed)
[place] OFFICE PROGRESS NOTE  Patient Care Team: Tracie Harrier, MD as PCP - General (Internal Medicine)  Cancer Staging No matching staging information was found for the patient.   Oncology History Overview Note  # MARCH 2016- LUNG CA- RUL- ADENO CA [mol testing-NA sec to poor samples] STAGE III s/p chemo-RT; [SEP 2016-CT Bx for mol testing; neg for malignancy]; CT-29th  NOV 2016-  Stable/ improved right upper lobe lesion;   # RECURRENT- CT JAN 2017- Stable right upper lobe lung nodule ~ 2.5cm; but progressive RML ~1.8cm; Start Tecentriq q 3W x3 ; March 30th CT 2017- Improved Lung Lesions; RML- 1.6x1.0; RUL ~3cm.; AUG 2nd 2017-CT stable.   # April/May 2018- RUL- local Progression on CT/PET- referral to rad-Onc  # July 2017 Elevated LFTs- likely from Hepatitis C [resolved on treatment- July 2018]; NOT from Tecentriq.  # Hep C [june 2018; treatment at [facility]]  # Chronic pain- on oxycodone.jan 2017-Bone scan- neg.  ---------------------------------------------------------------------------------    DIAGNOSIS: [ ]  RECURRENT ADENO CA LUNG  STAGE:  III   ; GOALS: CONTROL  CURRENT/MOST RECENT THERAPY [ ]  surveillance; last tecentrriq       Cancer of upper lobe of right lung (HCC)      INTERVAL HISTORY:  Mario [name] 59 y.o.  male pleasant patient above history of recurrent stage III adenocarcinoma the lung is here for follow-up/reveals of the CT scan.  Patient denies any worsening shortness of breath or cough.  Has chronic shortness of breath chronic cough.  Needs a refill on his inhalers.  Patient has chronic neck pain back pain for which he follows up with pain clinic.  Denies any headaches but denies any weight loss.  Review of Systems  Constitutional: Negative for chills, diaphoresis, fever, malaise/fatigue and weight loss.  HENT: Negative for nosebleeds and sore throat.   Eyes: Negative for double vision.  Respiratory: Positive for cough,  sputum production and shortness of breath. Negative for hemoptysis and wheezing.   Cardiovascular: Negative for chest pain, palpitations, orthopnea and leg swelling.  Gastrointestinal: Negative for abdominal pain, blood in stool, constipation, diarrhea, heartburn, melena, nausea and vomiting.  Genitourinary: Negative for dysuria, frequency and urgency.  Musculoskeletal: Positive for back pain and joint pain.  Skin: Negative.  Negative for itching and rash.  Neurological: Negative for dizziness, tingling, focal weakness, weakness and headaches.  Endo/Heme/Allergies: Does not bruise/bleed easily.  Psychiatric/Behavioral: Negative for depression. The patient is not nervous/anxious and does not have insomnia.       PAST MEDICAL HISTORY :  Past Medical History:  Diagnosis Date  . Arthritis   . Cancer of upper lobe of right lung ([place]) 09/19/2015  . Chicken pox   . Chronic pain 09/19/2015  . Current tobacco use 05/26/2014  . Elevated LFTs 09/19/2015  . Fracture of lower extremity 09/19/2015  . Kidney stones   . Lung cancer ([place]) 2015  . OA (osteoarthritis) of knee 05/26/2014  . Status post chemotherapy     PAST SURGICAL HISTORY :   Past Surgical History:  Procedure Laterality Date  . amputation of index finger Right 2010  . HAND SURGERY Right 2010   ORIF of right index finder, 2nd carpometacarpal joint dislocation right hand, proximal right humerus  . ORIF FEMUR FRACTURE Right 2010  . PORT-A-CATH REMOVAL N/A 01/15/2017   Procedure: REMOVAL PORT-A-CATH;  Surgeon: Nestor Lewandowsky, MD;  Location: [facility] ORS;  Service: General;  Laterality: N/A;  . SHOULDER SURGERY Right 2010   pins and  rods in shoulder    FAMILY HISTORY :   Family History  Problem Relation Age of Onset  . Hypertension Mother   . Hypertension Father     SOCIAL HISTORY:   Social History   Tobacco Use  . Smoking status: Current Every Day Smoker    Packs/day: 2.00    Years: 20.00    Pack years: 40.00    Types:  Cigarettes  . Smokeless tobacco: Never Used  . Tobacco comment: down to 3 cigarettes a month  Vaping Use  . Vaping Use: Never used  Substance Use Topics  . Alcohol use: Yes    Alcohol/week: 0.0 standard drinks    Comment: 2-4 Beers Weekly  . Drug use: Yes    Types: Marijuana    Comment: last use 3 days ago    ALLERGIES:  has No Known Allergies.  MEDICATIONS:  Current Outpatient Medications  Medication Sig Dispense Refill  . albuterol (PROVENTIL) (2.5 MG/3ML) 0.083% nebulizer solution Take 3 mLs (2.5 mg total) by nebulization every 6 (six) hours as needed for wheezing or shortness of breath. 75 mL 3  . albuterol (VENTOLIN HFA) 108 (90 Base) MCG/ACT inhaler Inhale 2 puffs into the lungs every 6 (six) hours as needed for wheezing or shortness of breath. 8 g 2  . Fluticasone-Salmeterol (ADVAIR DISKUS) 500-50 MCG/DOSE AEPB Inhale 1 puff into the lungs 2 (two) times daily. 1 each 3  . gabapentin (NEURONTIN) 100 MG capsule Take 1 capsule by mouth 3 (three) times daily.  0  . Multiple Vitamin (MULTI-VITAMINS) TABS Take 1 tablet by mouth daily.    . nicotine (NICODERM CQ - DOSED IN MG/24 HOURS) 14 mg/24hr patch Place 14 mg onto the skin daily.     Marland Kitchen oxyCODONE (OXYCONTIN) 10 mg 12 hr tablet Take 10 mg by mouth every 12 (twelve) hours.    . pregabalin (LYRICA) 25 MG capsule Take 1 capsule by mouth 2 (two) times daily.     No current facility-administered medications for this visit.   Facility-Administered Medications Ordered in Other Visits  Medication Dose Route Frequency Provider Last Rate Last Admin  . sodium chloride flush (NS) 0.9 % injection 10 mL  10 mL Intracatheter PRN Cammie Sickle, MD   10 mL at 05/26/15 1300  . sodium chloride flush (NS) 0.9 % injection 10 mL  10 mL Intravenous PRN Cammie Sickle, MD   10 mL at 07/07/15 1425    PHYSICAL EXAMINATION: ECOG PERFORMANCE STATUS: 0 - Asymptomatic  BP (!) 156/77 (BP Location: Left Arm, Patient Position: Sitting, Cuff  Size: Normal)   Pulse 64   Temp (!) 96 F (35.6 C) (Tympanic)   Ht 5\' 9"  (1.753 m)   Wt 108 lb 9.6 oz (49.3 kg)   SpO2 97%   BMI 16.04 kg/m   Filed Weights   10/12/19 1532  Weight: 108 lb 9.6 oz (49.3 kg)    Physical Exam Constitutional:      Comments: Thin built moderately nourished male patient no acute distress.  Is alone.  HENT:     Head: Normocephalic and atraumatic.     Mouth/Throat:     Pharynx: No oropharyngeal exudate.  Eyes:     Pupils: Pupils are equal, round, and reactive to light.  Cardiovascular:     Rate and Rhythm: Normal rate and regular rhythm.  Pulmonary:     Effort: No respiratory distress.     Breath sounds: No wheezing.  Abdominal:     General: Bowel sounds  are normal. There is no distension.     Palpations: Abdomen is soft. There is no mass.     Tenderness: There is no abdominal tenderness. There is no guarding or rebound.  Musculoskeletal:        General: No tenderness. Normal range of motion.     Cervical back: Normal range of motion and neck supple.  Skin:    General: Skin is warm.  Neurological:     Mental Status: He is alert and oriented to person, place, and time.  Psychiatric:        Mood and Affect: Affect normal.        LABORATORY DATA:  I have reviewed the data as listed    Component Value Date/Time   NA 136 10/12/2019 1504   NA 136 07/27/2014 0856   K 4.5 10/12/2019 1504   K 3.9 07/27/2014 0856   CL 102 10/12/2019 1504   CL 104 07/27/2014 0856   CO2 27 10/12/2019 1504   CO2 27 07/27/2014 0856   GLUCOSE 211 (H) 10/12/2019 1504   GLUCOSE 151 (H) 07/27/2014 0856   BUN 14 10/12/2019 1504   BUN 10 07/27/2014 0856   CREATININE 1.09 10/12/2019 1504   CREATININE 0.86 07/27/2014 0856   CREATININE 0.77 07/06/2014 0000   CALCIUM 8.4 (L) 10/12/2019 1504   CALCIUM 8.6 (L) 07/27/2014 0856   PROT 7.3 10/12/2019 1504   PROT 8.8 (H) 06/28/2014 1512   ALBUMIN 3.7 10/12/2019 1504   ALBUMIN 4.0 06/28/2014 1512   AST 24 10/12/2019  1504   AST 37 06/28/2014 1512   ALT 12 10/12/2019 1504   ALT 43 06/28/2014 1512   ALKPHOS 30 (L) 10/12/2019 1504   ALKPHOS 53 06/28/2014 1512   BILITOT 0.8 10/12/2019 1504   BILITOT 0.3 06/28/2014 1512   GFRNONAA >60 10/12/2019 1504   GFRNONAA >60 07/27/2014 0856   GFRAA >60 10/12/2019 1504   GFRAA >60 07/27/2014 0856    No results found for: SPEP, UPEP  Lab Results  Component Value Date   WBC 4.6 10/12/2019   NEUTROABS 2.4 10/12/2019   HGB 12.2 (L) 10/12/2019   HCT 36.7 (L) 10/12/2019   MCV 87.8 10/12/2019   PLT 237 10/12/2019      Chemistry      Component Value Date/Time   NA 136 10/12/2019 1504   NA 136 07/27/2014 0856   K 4.5 10/12/2019 1504   K 3.9 07/27/2014 0856   CL 102 10/12/2019 1504   CL 104 07/27/2014 0856   CO2 27 10/12/2019 1504   CO2 27 07/27/2014 0856   BUN 14 10/12/2019 1504   BUN 10 07/27/2014 0856   CREATININE 1.09 10/12/2019 1504   CREATININE 0.86 07/27/2014 0856   CREATININE 0.77 07/06/2014 0000      Component Value Date/Time   CALCIUM 8.4 (L) 10/12/2019 1504   CALCIUM 8.6 (L) 07/27/2014 0856   ALKPHOS 30 (L) 10/12/2019 1504   ALKPHOS 53 06/28/2014 1512   AST 24 10/12/2019 1504   AST 37 06/28/2014 1512   ALT 12 10/12/2019 1504   ALT 43 06/28/2014 1512   BILITOT 0.8 10/12/2019 1504   BILITOT 0.3 06/28/2014 1512       RADIOGRAPHIC STUDIES: I have personally reviewed the radiological images as listed and agreed with the findings in the report. No results found.   ASSESSMENT & PLAN:  Cancer of upper lobe of right lung [facility]) #Recurrent adenocarcinoma of the lung-right upper lobe/status post right upper lobe nodule SBRT June 2018.  July 2021-- STABLE RUL changes.  Stable.  # COPD/cough phlegm-STABLE;   Sent scripts for albuterol/advair.  Recommend follow-up with PCP/  # Chronic pain- unrelated to cancer follow up with pain clinic; stable  # Active smoker- counseled to quit.  Stable  # DISPOSITION:  # Follow up in 6 months-  MD/labs-cbc/cmp; CT chest prior-/-Dr.B  # I reviewed the blood work- with the patient in detail; also reviewed the imaging independently [as summarized above]; and with the patient in detail.         Orders Placed This Encounter  Procedures  . CT Chest W Contrast    Standing Status:   Future    Standing Expiration Date:   10/11/2020    Order Specific Question:   ** REASON FOR EXAM (FREE TEXT)    Answer:   Lung cancer    Order Specific Question:   If indicated for the ordered procedure, I authorize the administration of contrast media per Radiology protocol    Answer:   Yes    Order Specific Question:   Preferred imaging location?    Answer:   [facility]    Order Specific Question:   Radiology Contrast Protocol - do NOT remove file path    Answer:   [email or web address]   All questions were answered. The patient knows to call the clinic with any problems, questions or concerns.      Cammie Sickle, MD 10/13/2019 11:02 PM

## 2019-10-12 NOTE — Assessment & Plan Note (Addendum)
#  Recurrent adenocarcinoma of the lung-right upper lobe/status post right upper lobe nodule SBRT June 2018.  July 2021-- STABLE RUL changes.  Stable.  # COPD/cough phlegm-STABLE;   Sent scripts for albuterol/advair.  Recommend follow-up with PCP/  # Chronic pain- unrelated to cancer follow up with pain clinic; stable  # Active smoker- counseled to quit.  Stable  # DISPOSITION:  # Follow up in 6 months- MD/labs-cbc/cmp; CT chest prior-/-Dr.B  # I reviewed the blood work- with the patient in detail; also reviewed the imaging independently [as summarized above]; and with the patient in detail.

## 2020-04-11 ENCOUNTER — Ambulatory Visit: Admission: RE | Admit: 2020-04-11 | Payer: Self-pay | Source: Ambulatory Visit

## 2020-04-13 ENCOUNTER — Inpatient Hospital Stay: Payer: Self-pay | Admitting: Internal Medicine

## 2020-04-13 ENCOUNTER — Telehealth: Payer: Self-pay | Admitting: Internal Medicine

## 2020-04-13 ENCOUNTER — Inpatient Hospital Stay: Payer: Self-pay | Attending: Internal Medicine

## 2020-04-13 NOTE — Assessment & Plan Note (Deleted)
#  Recurrent adenocarcinoma of the lung-right upper lobe/status post right upper lobe nodule SBRT June 2018.  July 2021-- STABLE RUL changes.  Stable.  # COPD/cough phlegm-STABLE;   Sent scripts for albuterol/advair.  Recommend follow-up with PCP/  # Chronic pain- unrelated to cancer follow up with pain clinic; stable  # Active smoker- counseled to quit.  Stable  # DISPOSITION:  # Follow up in 6 months- MD/labs-cbc/cmp; CT chest prior-/-Dr.B  # I reviewed the blood work- with the patient in detail; also reviewed the imaging independently [as summarized above]; and with the patient in detail.

## 2020-04-13 NOTE — Telephone Encounter (Signed)
Call placed to pt in regards to missed appointment, unable to reach pt, left VM to return call.

## 2020-04-13 NOTE — Telephone Encounter (Signed)
04/13/2020 Attempted to reach pt via phone to r/s missed appts. Called 3 times, and someone answered each time but hung up before I could explain the reason for the call.  SRW

## 2020-04-13 NOTE — Progress Notes (Deleted)
[place] OFFICE PROGRESS NOTE  Patient Care Team: Tracie Harrier, MD as PCP - General (Internal Medicine)  Cancer Staging No matching staging information was found for the patient.   Oncology History Overview Note  # MARCH 2016- LUNG CA- RUL- ADENO CA [mol testing-NA sec to poor samples] STAGE III s/p chemo-RT; [SEP 2016-CT Bx for mol testing; neg for malignancy]; CT-29th  NOV 2016-  Stable/ improved right upper lobe lesion;   # RECURRENT- CT JAN 2017- Stable right upper lobe lung nodule ~ 2.5cm; but progressive RML ~1.8cm; Start Tecentriq q 3W x3 ; March 30th CT 2017- Improved Lung Lesions; RML- 1.6x1.0; RUL ~3cm.; AUG 2nd 2017-CT stable.   # April/May 2018- RUL- local Progression on CT/PET- referral to rad-Onc  # July 2017 Elevated LFTs- likely from Hepatitis C [resolved on treatment- July 2018]; NOT from Tecentriq.  # Hep C [june 2018; treatment at [facility]]  # Chronic pain- on oxycodone.jan 2017-Bone scan- neg.  ---------------------------------------------------------------------------------    DIAGNOSIS: [ ]  RECURRENT ADENO CA LUNG  STAGE:  III   ; GOALS: CONTROL  CURRENT/MOST RECENT THERAPY [ ]  surveillance; last tecentrriq       Cancer of upper lobe of right lung (HCC)      INTERVAL HISTORY:  Mario [name] 59 y.o.  male pleasant patient above history of recurrent stage III adenocarcinoma the lung is here for follow-up/reveals of the CT scan.  Patient denies any worsening shortness of breath or cough.  Has chronic shortness of breath chronic cough.  Needs a refill on his inhalers.  Patient has chronic neck pain back pain for which he follows up with pain clinic.  Denies any headaches but denies any weight loss.  Review of Systems  Constitutional: Negative for chills, diaphoresis, fever, malaise/fatigue and weight loss.  HENT: Negative for nosebleeds and sore throat.   Eyes: Negative for double vision.  Respiratory: Positive for cough,  sputum production and shortness of breath. Negative for hemoptysis and wheezing.   Cardiovascular: Negative for chest pain, palpitations, orthopnea and leg swelling.  Gastrointestinal: Negative for abdominal pain, blood in stool, constipation, diarrhea, heartburn, melena, nausea and vomiting.  Genitourinary: Negative for dysuria, frequency and urgency.  Musculoskeletal: Positive for back pain and joint pain.  Skin: Negative.  Negative for itching and rash.  Neurological: Negative for dizziness, tingling, focal weakness, weakness and headaches.  Endo/Heme/Allergies: Does not bruise/bleed easily.  Psychiatric/Behavioral: Negative for depression. The patient is not nervous/anxious and does not have insomnia.       PAST MEDICAL HISTORY :  Past Medical History:  Diagnosis Date  . Arthritis   . Cancer of upper lobe of right lung ([place]) 09/19/2015  . Chicken pox   . Chronic pain 09/19/2015  . Current tobacco use 05/26/2014  . Elevated LFTs 09/19/2015  . Fracture of lower extremity 09/19/2015  . Kidney stones   . Lung cancer ([place]) 2015  . OA (osteoarthritis) of knee 05/26/2014  . Status post chemotherapy     PAST SURGICAL HISTORY :   Past Surgical History:  Procedure Laterality Date  . amputation of index finger Right 2010  . HAND SURGERY Right 2010   ORIF of right index finder, 2nd carpometacarpal joint dislocation right hand, proximal right humerus  . ORIF FEMUR FRACTURE Right 2010  . PORT-A-CATH REMOVAL N/A 01/15/2017   Procedure: REMOVAL PORT-A-CATH;  Surgeon: Nestor Lewandowsky, MD;  Location: [facility] ORS;  Service: General;  Laterality: N/A;  . SHOULDER SURGERY Right 2010   pins and  rods in shoulder    FAMILY HISTORY :   Family History  Problem Relation Age of Onset  . Hypertension Mother   . Hypertension Father     SOCIAL HISTORY:   Social History   Tobacco Use  . Smoking status: Current Every Day Smoker    Packs/day: 2.00    Years: 20.00    Pack years: 40.00    Types:  Cigarettes  . Smokeless tobacco: Never Used  . Tobacco comment: down to 3 cigarettes a month  Vaping Use  . Vaping Use: Never used  Substance Use Topics  . Alcohol use: Yes    Alcohol/week: 0.0 standard drinks    Comment: 2-4 Beers Weekly  . Drug use: Yes    Types: Marijuana    Comment: last use 3 days ago    ALLERGIES:  has No Known Allergies.  MEDICATIONS:  Current Outpatient Medications  Medication Sig Dispense Refill  . albuterol (PROVENTIL) (2.5 MG/3ML) 0.083% nebulizer solution Take 3 mLs (2.5 mg total) by nebulization every 6 (six) hours as needed for wheezing or shortness of breath. 75 mL 3  . albuterol (VENTOLIN HFA) 108 (90 Base) MCG/ACT inhaler Inhale 2 puffs into the lungs every 6 (six) hours as needed for wheezing or shortness of breath. 8 g 2  . Fluticasone-Salmeterol (ADVAIR DISKUS) 500-50 MCG/DOSE AEPB Inhale 1 puff into the lungs 2 (two) times daily. 1 each 3  . gabapentin (NEURONTIN) 100 MG capsule Take 1 capsule by mouth 3 (three) times daily.  0  . Multiple Vitamin (MULTI-VITAMINS) TABS Take 1 tablet by mouth daily.    . nicotine (NICODERM CQ - DOSED IN MG/24 HOURS) 14 mg/24hr patch Place 14 mg onto the skin daily.     Marland Kitchen oxyCODONE (OXYCONTIN) 10 mg 12 hr tablet Take 10 mg by mouth every 12 (twelve) hours.    . pregabalin (LYRICA) 25 MG capsule Take 1 capsule by mouth 2 (two) times daily.     No current facility-administered medications for this visit.   Facility-Administered Medications Ordered in Other Visits  Medication Dose Route Frequency Provider Last Rate Last Admin  . sodium chloride flush (NS) 0.9 % injection 10 mL  10 mL Intracatheter PRN Cammie Sickle, MD   10 mL at 05/26/15 1300  . sodium chloride flush (NS) 0.9 % injection 10 mL  10 mL Intravenous PRN Cammie Sickle, MD   10 mL at 07/07/15 1425    PHYSICAL EXAMINATION: ECOG PERFORMANCE STATUS: 0 - Asymptomatic  There were no vitals taken for this visit.  There were no vitals  filed for this visit.  Physical Exam Constitutional:      Comments: Thin built moderately nourished male patient no acute distress.  Is alone.  HENT:     Head: Normocephalic and atraumatic.     Mouth/Throat:     Pharynx: No oropharyngeal exudate.  Eyes:     Pupils: Pupils are equal, round, and reactive to light.  Cardiovascular:     Rate and Rhythm: Normal rate and regular rhythm.  Pulmonary:     Effort: No respiratory distress.     Breath sounds: No wheezing.  Abdominal:     General: Bowel sounds are normal. There is no distension.     Palpations: Abdomen is soft. There is no mass.     Tenderness: There is no abdominal tenderness. There is no guarding or rebound.  Musculoskeletal:        General: No tenderness. Normal range of motion.  Cervical back: Normal range of motion and neck supple.  Skin:    General: Skin is warm.  Neurological:     Mental Status: He is alert and oriented to person, place, and time.  Psychiatric:        Mood and Affect: Affect normal.        LABORATORY DATA:  I have reviewed the data as listed    Component Value Date/Time   NA 136 10/12/2019 1504   NA 136 07/27/2014 0856   K 4.5 10/12/2019 1504   K 3.9 07/27/2014 0856   CL 102 10/12/2019 1504   CL 104 07/27/2014 0856   CO2 27 10/12/2019 1504   CO2 27 07/27/2014 0856   GLUCOSE 211 (H) 10/12/2019 1504   GLUCOSE 151 (H) 07/27/2014 0856   BUN 14 10/12/2019 1504   BUN 10 07/27/2014 0856   CREATININE 1.09 10/12/2019 1504   CREATININE 0.86 07/27/2014 0856   CREATININE 0.77 07/06/2014 0000   CALCIUM 8.4 (L) 10/12/2019 1504   CALCIUM 8.6 (L) 07/27/2014 0856   PROT 7.3 10/12/2019 1504   PROT 8.8 (H) 06/28/2014 1512   ALBUMIN 3.7 10/12/2019 1504   ALBUMIN 4.0 06/28/2014 1512   AST 24 10/12/2019 1504   AST 37 06/28/2014 1512   ALT 12 10/12/2019 1504   ALT 43 06/28/2014 1512   ALKPHOS 30 (L) 10/12/2019 1504   ALKPHOS 53 06/28/2014 1512   BILITOT 0.8 10/12/2019 1504   BILITOT 0.3  06/28/2014 1512   GFRNONAA >60 10/12/2019 1504   GFRNONAA >60 07/27/2014 0856   GFRAA >60 10/12/2019 1504   GFRAA >60 07/27/2014 0856    No results found for: SPEP, UPEP  Lab Results  Component Value Date   WBC 4.6 10/12/2019   NEUTROABS 2.4 10/12/2019   HGB 12.2 (L) 10/12/2019   HCT 36.7 (L) 10/12/2019   MCV 87.8 10/12/2019   PLT 237 10/12/2019      Chemistry      Component Value Date/Time   NA 136 10/12/2019 1504   NA 136 07/27/2014 0856   K 4.5 10/12/2019 1504   K 3.9 07/27/2014 0856   CL 102 10/12/2019 1504   CL 104 07/27/2014 0856   CO2 27 10/12/2019 1504   CO2 27 07/27/2014 0856   BUN 14 10/12/2019 1504   BUN 10 07/27/2014 0856   CREATININE 1.09 10/12/2019 1504   CREATININE 0.86 07/27/2014 0856   CREATININE 0.77 07/06/2014 0000      Component Value Date/Time   CALCIUM 8.4 (L) 10/12/2019 1504   CALCIUM 8.6 (L) 07/27/2014 0856   ALKPHOS 30 (L) 10/12/2019 1504   ALKPHOS 53 06/28/2014 1512   AST 24 10/12/2019 1504   AST 37 06/28/2014 1512   ALT 12 10/12/2019 1504   ALT 43 06/28/2014 1512   BILITOT 0.8 10/12/2019 1504   BILITOT 0.3 06/28/2014 1512       RADIOGRAPHIC STUDIES: I have personally reviewed the radiological images as listed and agreed with the findings in the report. No results found.   ASSESSMENT & PLAN:  No problem-specific Assessment & Plan notes found for this encounter.   No orders of the defined types were placed in this encounter.  All questions were answered. The patient knows to call the clinic with any problems, questions or concerns.      Cammie Sickle, MD 04/13/2020 12:42 PM

## 2020-04-14 ENCOUNTER — Encounter: Payer: Self-pay | Admitting: *Deleted

## 2020-04-14 NOTE — Telephone Encounter (Signed)
My chart message also sent to the patient in attempts to r/s his apts.

## 2020-04-18 NOTE — Telephone Encounter (Signed)
Colette, Patient no showed for scans/apts with Dr. B last week. Please attempt to reach out patient this week to see when he would like to r/s his apts. I sent him a [place], but he has not viewed it. Thanks. Nira Conn

## 2020-04-26 ENCOUNTER — Encounter: Payer: Self-pay | Admitting: *Deleted

## 2020-04-26 NOTE — Telephone Encounter (Signed)
Unable to leave vm for patient. Will send letter to patient.

## 2020-05-10 ENCOUNTER — Telehealth: Payer: Self-pay | Admitting: Internal Medicine

## 2020-05-10 NOTE — Telephone Encounter (Signed)
Pt left message requesting a call back to [phone] about his appointments from January that were rescheduled due to [place].  Routing to team for follow up.

## 2020-05-10 NOTE — Telephone Encounter (Signed)
Patient mailed no show letter on 04/16/20. RN/Team was not able to get patient to answer the phone.  Colette- patient will need CT scan r/s as well as his lab/md apts with Dr. Jacinto Reap.

## 2020-05-18 ENCOUNTER — Other Ambulatory Visit: Payer: Self-pay

## 2020-05-18 ENCOUNTER — Ambulatory Visit
Admission: RE | Admit: 2020-05-18 | Discharge: 2020-05-18 | Disposition: A | Discharge status: Home / Self Care | Payer: BC Managed Care – PPO | Source: Ambulatory Visit | Attending: Internal Medicine | Admitting: Internal Medicine

## 2020-05-18 DIAGNOSIS — C3411 Malignant neoplasm of upper lobe, right bronchus or lung: Secondary | ICD-10-CM | POA: Diagnosis present

## 2020-05-18 LAB — POCT I-STAT CREATININE: Creatinine, Ser: 1.2 mg/dL (ref 0.61–1.24)

## 2020-05-18 MED ORDER — IOHEXOL 300 MG/ML  SOLN
75.0000 mL | Freq: Once | INTRAMUSCULAR | Status: AC | PRN
  Administered 2020-05-18: 75 mL via INTRAVENOUS

## 2020-05-23 ENCOUNTER — Other Ambulatory Visit: Payer: Self-pay

## 2020-05-23 ENCOUNTER — Inpatient Hospital Stay: Payer: BC Managed Care – PPO | Attending: Internal Medicine

## 2020-05-23 ENCOUNTER — Encounter: Payer: Self-pay | Admitting: Internal Medicine

## 2020-05-23 ENCOUNTER — Inpatient Hospital Stay (HOSPITAL_BASED_OUTPATIENT_CLINIC_OR_DEPARTMENT_OTHER): Payer: BC Managed Care – PPO | Admitting: Internal Medicine

## 2020-05-23 DIAGNOSIS — R197 Diarrhea, unspecified: Secondary | ICD-10-CM | POA: Diagnosis not present

## 2020-05-23 DIAGNOSIS — Z8249 Family history of ischemic heart disease and other diseases of the circulatory system: Secondary | ICD-10-CM | POA: Insufficient documentation

## 2020-05-23 DIAGNOSIS — C3411 Malignant neoplasm of upper lobe, right bronchus or lung: Secondary | ICD-10-CM | POA: Insufficient documentation

## 2020-05-23 DIAGNOSIS — B182 Chronic viral hepatitis C: Secondary | ICD-10-CM

## 2020-05-23 DIAGNOSIS — R0602 Shortness of breath: Secondary | ICD-10-CM | POA: Diagnosis not present

## 2020-05-23 DIAGNOSIS — R7989 Other specified abnormal findings of blood chemistry: Secondary | ICD-10-CM | POA: Diagnosis not present

## 2020-05-23 DIAGNOSIS — R945 Abnormal results of liver function studies: Secondary | ICD-10-CM

## 2020-05-23 DIAGNOSIS — G8929 Other chronic pain: Secondary | ICD-10-CM

## 2020-05-23 DIAGNOSIS — M549 Dorsalgia, unspecified: Secondary | ICD-10-CM

## 2020-05-23 DIAGNOSIS — J449 Chronic obstructive pulmonary disease, unspecified: Secondary | ICD-10-CM | POA: Insufficient documentation

## 2020-05-23 DIAGNOSIS — F17210 Nicotine dependence, cigarettes, uncomplicated: Secondary | ICD-10-CM | POA: Insufficient documentation

## 2020-05-23 DIAGNOSIS — Z79899 Other long term (current) drug therapy: Secondary | ICD-10-CM | POA: Diagnosis not present

## 2020-05-23 DIAGNOSIS — Z9221 Personal history of antineoplastic chemotherapy: Secondary | ICD-10-CM | POA: Diagnosis not present

## 2020-05-23 DIAGNOSIS — D649 Anemia, unspecified: Secondary | ICD-10-CM

## 2020-05-23 DIAGNOSIS — Z87442 Personal history of urinary calculi: Secondary | ICD-10-CM | POA: Diagnosis not present

## 2020-05-23 LAB — CBC WITH DIFFERENTIAL/PLATELET
Abs Immature Granulocytes: 0 10*3/uL (ref 0.00–0.07)
Basophils Absolute: 0 10*3/uL (ref 0.0–0.1)
Basophils Relative: 0 %
Eosinophils Absolute: 0 10*3/uL (ref 0.0–0.5)
Eosinophils Relative: 0 %
HCT: 36.8 % — ABNORMAL LOW (ref 39.0–52.0)
Hemoglobin: 11.8 g/dL — ABNORMAL LOW (ref 13.0–17.0)
Immature Granulocytes: 0 %
Lymphocytes Relative: 30 %
Lymphs Abs: 1.5 10*3/uL (ref 0.7–4.0)
MCH: 29.1 pg (ref 26.0–34.0)
MCHC: 32.1 g/dL (ref 30.0–36.0)
MCV: 90.9 fL (ref 80.0–100.0)
Monocytes Absolute: 0.6 10*3/uL (ref 0.1–1.0)
Monocytes Relative: 12 %
Neutro Abs: 2.9 10*3/uL (ref 1.7–7.7)
Neutrophils Relative %: 58 %
Platelets: 198 10*3/uL (ref 150–400)
RBC: 4.05 MIL/uL — ABNORMAL LOW (ref 4.22–5.81)
RDW: 12.7 % (ref 11.5–15.5)
WBC: 5 10*3/uL (ref 4.0–10.5)
nRBC: 0 % (ref 0.0–0.2)

## 2020-05-23 LAB — COMPREHENSIVE METABOLIC PANEL
ALT: 15 U/L (ref 0–44)
AST: 26 U/L (ref 15–41)
Albumin: 3.9 g/dL (ref 3.5–5.0)
Alkaline Phosphatase: 28 U/L — ABNORMAL LOW (ref 38–126)
Anion gap: 11 (ref 5–15)
BUN: 12 mg/dL (ref 6–20)
CO2: 25 mmol/L (ref 22–32)
Calcium: 8.6 mg/dL — ABNORMAL LOW (ref 8.9–10.3)
Chloride: 97 mmol/L — ABNORMAL LOW (ref 98–111)
Creatinine, Ser: 1.11 mg/dL (ref 0.61–1.24)
GFR, Estimated: >60 mL/min (ref >60)
Glucose, Bld: 183 mg/dL — ABNORMAL HIGH (ref 70–99)
Potassium: 4 mmol/L (ref 3.5–5.1)
Sodium: 133 mmol/L — ABNORMAL LOW (ref 135–145)
Total Bilirubin: 0.5 mg/dL (ref 0.3–1.2)
Total Protein: 7.2 g/dL (ref 6.5–8.1)

## 2020-05-23 LAB — IRON AND TIBC
Iron: 104 ug/dL (ref 45–182)
Saturation Ratios: 31 % (ref 17.9–39.5)
TIBC: 339 ug/dL (ref 250–450)
UIBC: 235 ug/dL

## 2020-05-23 LAB — FERRITIN: Ferritin: 100 ng/mL (ref 24–336)

## 2020-05-23 NOTE — Patient Instructions (Signed)
#   take immoidum 1 pill every 4-6 hours for diarrhea.

## 2020-05-23 NOTE — Progress Notes (Signed)
[place] OFFICE PROGRESS NOTE  Patient Care Team: Tracie Harrier, MD as PCP - General (Internal Medicine)  Cancer Staging No matching staging information was found for the patient.   Oncology History Overview Note  # MARCH 2016- LUNG CA- RUL- ADENO CA [mol testing-NA sec to poor samples] STAGE III s/p chemo-RT; [SEP 2016-CT Bx for mol testing; neg for malignancy]; CT-29th  NOV 2016-  Stable/ improved right upper lobe lesion;   # RECURRENT- CT JAN 2017- Stable right upper lobe lung nodule ~ 2.5cm; but progressive RML ~1.8cm; Start Tecentriq q 3W x3 ; March 30th CT 2017- Improved Lung Lesions; RML- 1.6x1.0; RUL ~3cm.; AUG 2nd 2017-CT stable.   # April/May 2018- RUL- local Progression on CT/PET- referral to rad-Onc  # July 2017 Elevated LFTs- likely from Hepatitis C [resolved on treatment- July 2018]; NOT from Tecentriq.  # Hep C [june 2018; treatment at [facility]]  # Chronic pain- on oxycodone.jan 2017-Bone scan- neg.  ---------------------------------------------------------------------------------    DIAGNOSIS: [ ]  RECURRENT ADENO CA LUNG  STAGE:  III   ; GOALS: CONTROL  CURRENT/MOST RECENT THERAPY [ ]  surveillance; last tecentrriq       Cancer of upper lobe of right lung (HCC)      INTERVAL HISTORY:  Mario [name] 60 y.o.  male pleasant patient above history of recurrent stage III adenocarcinoma the lung is here for follow-up/review the results of the CT scan.  Patient denies any new shortness of breath or cough.  He continues to have chronic shortness of breath/cough.  He complains of loose stools multiple for the last 2 days.  Denies any blood in stools.  Denies abdominal cramping.  Denies any black or stools.  Remote colonoscopy.  No fever no chills.  States his grandson was sick recently.  Otherwise no other sick exposure.  Review of Systems  Constitutional: Negative for chills, diaphoresis, fever, malaise/fatigue and weight loss.  HENT:  Negative for nosebleeds and sore throat.   Eyes: Negative for double vision.  Respiratory: Positive for cough, sputum production and shortness of breath. Negative for hemoptysis and wheezing.   Cardiovascular: Negative for chest pain, palpitations, orthopnea and leg swelling.  Gastrointestinal: Positive for diarrhea. Negative for abdominal pain, blood in stool, constipation, heartburn, melena, nausea and vomiting.  Genitourinary: Negative for dysuria, frequency and urgency.  Musculoskeletal: Positive for back pain and joint pain.  Skin: Negative.  Negative for itching and rash.  Neurological: Negative for dizziness, tingling, focal weakness, weakness and headaches.  Endo/Heme/Allergies: Does not bruise/bleed easily.  Psychiatric/Behavioral: Negative for depression. The patient is not nervous/anxious and does not have insomnia.       PAST MEDICAL HISTORY :  Past Medical History:  Diagnosis Date  . Arthritis   . Cancer of upper lobe of right lung ([place]) 09/19/2015  . Chicken pox   . Chronic pain 09/19/2015  . Current tobacco use 05/26/2014  . Elevated LFTs 09/19/2015  . Fracture of lower extremity 09/19/2015  . Kidney stones   . Lung cancer ([place]) 2015  . OA (osteoarthritis) of knee 05/26/2014  . Status post chemotherapy     PAST SURGICAL HISTORY :   Past Surgical History:  Procedure Laterality Date  . amputation of index finger Right 2010  . HAND SURGERY Right 2010   ORIF of right index finder, 2nd carpometacarpal joint dislocation right hand, proximal right humerus  . ORIF FEMUR FRACTURE Right 2010  . PORT-A-CATH REMOVAL N/A 01/15/2017   Procedure: REMOVAL PORT-A-CATH;  Surgeon: Genevive Bi,  Christia Reading, MD;  Location: [facility] ORS;  Service: General;  Laterality: N/A;  . SHOULDER SURGERY Right 2010   pins and rods in shoulder    FAMILY HISTORY :   Family History  Problem Relation Age of Onset  . Hypertension Mother   . Hypertension Father     SOCIAL HISTORY:   Social History   Tobacco  Use  . Smoking status: Current Every Day Smoker    Packs/day: 2.00    Years: 20.00    Pack years: 40.00    Types: Cigarettes  . Smokeless tobacco: Never Used  . Tobacco comment: down to 3 cigarettes a month  Vaping Use  . Vaping Use: Never used  Substance Use Topics  . Alcohol use: Yes    Alcohol/week: 0.0 standard drinks    Comment: 2-4 Beers Weekly  . Drug use: Yes    Types: Marijuana    Comment: last use 3 days ago    ALLERGIES:  has No Known Allergies.  MEDICATIONS:  Current Outpatient Medications  Medication Sig Dispense Refill  . Multiple Vitamin (MULTI-VITAMINS) TABS Take 1 tablet by mouth daily.    Marland Kitchen albuterol (PROVENTIL) (2.5 MG/3ML) 0.083% nebulizer solution Take 3 mLs (2.5 mg total) by nebulization every 6 (six) hours as needed for wheezing or shortness of breath. (Patient not taking: Reported on 05/23/2020) 75 mL 3  . albuterol (VENTOLIN HFA) 108 (90 Base) MCG/ACT inhaler Inhale 2 puffs into the lungs every 6 (six) hours as needed for wheezing or shortness of breath. (Patient not taking: Reported on 05/23/2020) 8 g 2  . Fluticasone-Salmeterol (ADVAIR DISKUS) 500-50 MCG/DOSE AEPB Inhale 1 puff into the lungs 2 (two) times daily. (Patient not taking: Reported on 05/23/2020) 1 each 3  . gabapentin (NEURONTIN) 100 MG capsule Take 1 capsule by mouth 3 (three) times daily. (Patient not taking: Reported on 05/23/2020)  0  . oxyCODONE (OXYCONTIN) 10 mg 12 hr tablet Take 10 mg by mouth every 12 (twelve) hours. (Patient not taking: Reported on 05/23/2020)    . pregabalin (LYRICA) 25 MG capsule Take 1 capsule by mouth 2 (two) times daily. (Patient not taking: Reported on 05/23/2020)     No current facility-administered medications for this visit.   Facility-Administered Medications Ordered in Other Visits  Medication Dose Route Frequency Provider Last Rate Last Admin  . sodium chloride flush (NS) 0.9 % injection 10 mL  10 mL Intracatheter PRN Cammie Sickle, MD   10 mL at  05/26/15 1300  . sodium chloride flush (NS) 0.9 % injection 10 mL  10 mL Intravenous PRN Cammie Sickle, MD   10 mL at 07/07/15 1425    PHYSICAL EXAMINATION: ECOG PERFORMANCE STATUS: 0 - Asymptomatic  BP 134/67 (BP Location: Left Arm, Patient Position: Sitting, Cuff Size: Normal)   Pulse 74   Temp (!) 97 F (36.1 C) (Tympanic)   Resp 16   Ht 5\' 9"  (1.753 m)   Wt 115 lb (52.2 kg)   SpO2 100%   BMI 16.98 kg/m   Filed Weights   05/23/20 1508  Weight: 115 lb (52.2 kg)    Physical Exam Constitutional:      Comments: Thin built moderately nourished male patient no acute distress.  Is alone.  HENT:     Head: Normocephalic and atraumatic.     Mouth/Throat:     Pharynx: No oropharyngeal exudate.  Eyes:     Pupils: Pupils are equal, round, and reactive to light.  Cardiovascular:     Rate  and Rhythm: Normal rate and regular rhythm.  Pulmonary:     Effort: No respiratory distress.     Breath sounds: No wheezing.  Abdominal:     General: Bowel sounds are normal. There is no distension.     Palpations: Abdomen is soft. There is no mass.     Tenderness: There is no abdominal tenderness. There is no guarding or rebound.  Musculoskeletal:        General: No tenderness. Normal range of motion.     Cervical back: Normal range of motion and neck supple.  Skin:    General: Skin is warm.  Neurological:     Mental Status: He is alert and oriented to person, place, and time.  Psychiatric:        Mood and Affect: Affect normal.        LABORATORY DATA:  I have reviewed the data as listed    Component Value Date/Time   NA 133 (L) 05/23/2020 1459   NA 136 07/27/2014 0856   K 4.0 05/23/2020 1459   K 3.9 07/27/2014 0856   CL 97 (L) 05/23/2020 1459   CL 104 07/27/2014 0856   CO2 25 05/23/2020 1459   CO2 27 07/27/2014 0856   GLUCOSE 183 (H) 05/23/2020 1459   GLUCOSE 151 (H) 07/27/2014 0856   BUN 12 05/23/2020 1459   BUN 10 07/27/2014 0856   CREATININE 1.11 05/23/2020  1459   CREATININE 0.86 07/27/2014 0856   CREATININE 0.77 07/06/2014 0000   CALCIUM 8.6 (L) 05/23/2020 1459   CALCIUM 8.6 (L) 07/27/2014 0856   PROT 7.2 05/23/2020 1459   PROT 8.8 (H) 06/28/2014 1512   ALBUMIN 3.9 05/23/2020 1459   ALBUMIN 4.0 06/28/2014 1512   AST 26 05/23/2020 1459   AST 37 06/28/2014 1512   ALT 15 05/23/2020 1459   ALT 43 06/28/2014 1512   ALKPHOS 28 (L) 05/23/2020 1459   ALKPHOS 53 06/28/2014 1512   BILITOT 0.5 05/23/2020 1459   BILITOT 0.3 06/28/2014 1512   GFRNONAA >60 05/23/2020 1459   GFRNONAA >60 07/27/2014 0856   GFRAA >60 10/12/2019 1504   GFRAA >60 07/27/2014 0856    No results found for: SPEP, UPEP  Lab Results  Component Value Date   WBC 5.0 05/23/2020   NEUTROABS 2.9 05/23/2020   HGB 11.8 (L) 05/23/2020   HCT 36.8 (L) 05/23/2020   MCV 90.9 05/23/2020   PLT 198 05/23/2020      Chemistry      Component Value Date/Time   NA 133 (L) 05/23/2020 1459   NA 136 07/27/2014 0856   K 4.0 05/23/2020 1459   K 3.9 07/27/2014 0856   CL 97 (L) 05/23/2020 1459   CL 104 07/27/2014 0856   CO2 25 05/23/2020 1459   CO2 27 07/27/2014 0856   BUN 12 05/23/2020 1459   BUN 10 07/27/2014 0856   CREATININE 1.11 05/23/2020 1459   CREATININE 0.86 07/27/2014 0856   CREATININE 0.77 07/06/2014 0000      Component Value Date/Time   CALCIUM 8.6 (L) 05/23/2020 1459   CALCIUM 8.6 (L) 07/27/2014 0856   ALKPHOS 28 (L) 05/23/2020 1459   ALKPHOS 53 06/28/2014 1512   AST 26 05/23/2020 1459   AST 37 06/28/2014 1512   ALT 15 05/23/2020 1459   ALT 43 06/28/2014 1512   BILITOT 0.5 05/23/2020 1459   BILITOT 0.3 06/28/2014 1512       RADIOGRAPHIC STUDIES: I have personally reviewed the radiological images as listed and agreed  with the findings in the report. No results found.   ASSESSMENT & PLAN:  Cancer of upper lobe of right lung [facility]) #Recurrent adenocarcinoma of the lung-right upper lobe/status post right upper lobe nodule SBRT June 2018.FEB 18th, 2022-  Stable extensive post treatment changes involving the right upper lobe. No findings suspicious for recurrent mass, mediastinal/hilar adenopathy or metastatic pulmonary disease.  Stable.  Will order imaging at next visit.  # acute diarrhea- [multiple loose stools]- no ABx; ? Grandson-sick. Recommend immoidum; if not better run stool studies.  Low clinical suspicion for C. difficile at this time.  # Mild Anemia- Hb 11.8; remote colonoscopy; check Iron studies/ferritin.  If low would recommend GI evaluation/colonoscopy.  # COPD/cough phlegm-stable Recommend follow-up with PCP/  # Chronic pain- unrelated to cancer follow up with pain clinic-stable  # Active smoker- counseled to quit. 1-2 cig/week.   # DISPOSITION: will call with results # Follow up in 6 months- MD/labs-cbc/cmp;-Dr.B  # I reviewed the blood work- with the patient in detail; also reviewed the imaging independently [as summarized above]; and with the patient in detail.          Orders Placed This Encounter  Procedures  . Ferritin    Standing Status:   Future    Number of Occurrences:   1    Standing Expiration Date:   05/23/2021  . Iron and TIBC    Standing Status:   Future    Number of Occurrences:   1    Standing Expiration Date:   05/23/2021  . CBC with Differential/Platelet    Standing Status:   Future    Standing Expiration Date:   05/23/2021  . Comprehensive metabolic panel    Standing Status:   Future    Standing Expiration Date:   05/23/2021   All questions were answered. The patient knows to call the clinic with any problems, questions or concerns.      Cammie Sickle, MD 05/23/2020 4:35 PM

## 2020-05-23 NOTE — Progress Notes (Signed)
Has been having diarrhea and SOB for the past week.

## 2020-05-23 NOTE — Assessment & Plan Note (Addendum)
#  Recurrent adenocarcinoma of the lung-right upper lobe/status post right upper lobe nodule SBRT June 2018.FEB 18th, 2022- Stable extensive post treatment changes involving the right upper lobe. No findings suspicious for recurrent mass, mediastinal/hilar adenopathy or metastatic pulmonary disease.  Stable.  Will order imaging at next visit.  # acute diarrhea- [multiple loose stools]- no ABx; ? Grandson-sick. Recommend immoidum; if not better run stool studies.  Low clinical suspicion for C. difficile at this time.  # Mild Anemia- Hb 11.8; remote colonoscopy; check Iron studies/ferritin.  If low would recommend GI evaluation/colonoscopy.  # COPD/cough phlegm-stable Recommend follow-up with PCP/  # Chronic pain- unrelated to cancer follow up with pain clinic-stable  # Active smoker- counseled to quit. 1-2 cig/week.   # DISPOSITION: will call with results # Follow up in 6 months- MD/labs-cbc/cmp;-Dr.B  # I reviewed the blood work- with the patient in detail; also reviewed the imaging independently [as summarized above]; and with the patient in detail.

## 2020-10-10 ENCOUNTER — Emergency Department
Admission: EM | Admit: 2020-10-10 | Discharge: 2020-10-10 | Disposition: A | Discharge status: Home / Self Care | Payer: BC Managed Care – PPO | Attending: Emergency Medicine | Admitting: Emergency Medicine

## 2020-10-10 ENCOUNTER — Other Ambulatory Visit: Payer: Self-pay

## 2020-10-10 DIAGNOSIS — U071 COVID-19: Secondary | ICD-10-CM

## 2020-10-10 DIAGNOSIS — Z85118 Personal history of other malignant neoplasm of bronchus and lung: Secondary | ICD-10-CM | POA: Insufficient documentation

## 2020-10-10 DIAGNOSIS — J069 Acute upper respiratory infection, unspecified: Secondary | ICD-10-CM | POA: Insufficient documentation

## 2020-10-10 DIAGNOSIS — F17210 Nicotine dependence, cigarettes, uncomplicated: Secondary | ICD-10-CM | POA: Insufficient documentation

## 2020-10-10 LAB — COMPREHENSIVE METABOLIC PANEL
ALT: 20 U/L (ref 0–44)
AST: 44 U/L — ABNORMAL HIGH (ref 15–41)
Albumin: 4 g/dL (ref 3.5–5.0)
Alkaline Phosphatase: 31 U/L — ABNORMAL LOW (ref 38–126)
Anion gap: 6 (ref 5–15)
BUN: 18 mg/dL (ref 6–20)
CO2: 25 mmol/L (ref 22–32)
Calcium: 8.4 mg/dL — ABNORMAL LOW (ref 8.9–10.3)
Chloride: 102 mmol/L (ref 98–111)
Creatinine, Ser: 1.24 mg/dL (ref 0.61–1.24)
GFR, Estimated: >60 mL/min (ref >60)
Glucose, Bld: 85 mg/dL (ref 70–99)
Potassium: 4.9 mmol/L (ref 3.5–5.1)
Sodium: 133 mmol/L — ABNORMAL LOW (ref 135–145)
Total Bilirubin: 0.7 mg/dL (ref 0.3–1.2)
Total Protein: 7.6 g/dL (ref 6.5–8.1)

## 2020-10-10 LAB — CBC WITH DIFFERENTIAL/PLATELET
Abs Immature Granulocytes: 0.01 10*3/uL (ref 0.00–0.07)
Basophils Absolute: 0 10*3/uL (ref 0.0–0.1)
Basophils Relative: 0 %
Eosinophils Absolute: 0 10*3/uL (ref 0.0–0.5)
Eosinophils Relative: 0 %
HCT: 44 % (ref 39.0–52.0)
Hemoglobin: 14.3 g/dL (ref 13.0–17.0)
Immature Granulocytes: 0 %
Lymphocytes Relative: 37 %
Lymphs Abs: 1.7 10*3/uL (ref 0.7–4.0)
MCH: 28.8 pg (ref 26.0–34.0)
MCHC: 32.5 g/dL (ref 30.0–36.0)
MCV: 88.5 fL (ref 80.0–100.0)
Monocytes Absolute: 1 10*3/uL (ref 0.1–1.0)
Monocytes Relative: 22 %
Neutro Abs: 1.9 10*3/uL (ref 1.7–7.7)
Neutrophils Relative %: 41 %
Platelets: 173 10*3/uL (ref 150–400)
RBC: 4.97 MIL/uL (ref 4.22–5.81)
RDW: 12.7 % (ref 11.5–15.5)
WBC: 4.6 10*3/uL (ref 4.0–10.5)
nRBC: 0 % (ref 0.0–0.2)

## 2020-10-10 MED ORDER — EPINEPHRINE 0.3 MG/0.3ML IJ SOAJ: 0.3000 mg | Freq: Once | INTRAMUSCULAR | Status: DC | PRN

## 2020-10-10 MED ORDER — DIPHENHYDRAMINE HCL 50 MG/ML IJ SOLN: 50.0000 mg | Freq: Once | INTRAMUSCULAR | Status: DC | PRN

## 2020-10-10 MED ORDER — ALBUTEROL SULFATE HFA 108 (90 BASE) MCG/ACT IN AERS
2.0000 | INHALATION_SPRAY | Freq: Once | RESPIRATORY_TRACT | Status: DC | PRN
  Filled 2020-10-10: qty 6.7

## 2020-10-10 MED ORDER — FAMOTIDINE IN NACL 20-0.9 MG/50ML-% IV SOLN: 20.0000 mg | Freq: Once | INTRAVENOUS | Status: DC | PRN

## 2020-10-10 MED ORDER — METHYLPREDNISOLONE SODIUM SUCC 125 MG IJ SOLR: 125.0000 mg | Freq: Once | INTRAMUSCULAR | Status: DC | PRN

## 2020-10-10 MED ORDER — BEBTELOVIMAB 175 MG/2 ML IV (EUA)
175.0000 mg | Freq: Once | INTRAMUSCULAR | Status: AC
  Administered 2020-10-10: 175 mg via INTRAVENOUS
  Filled 2020-10-10: qty 2

## 2020-10-10 MED ORDER — SODIUM CHLORIDE 0.9 % IV SOLN: INTRAVENOUS | Status: DC | PRN

## 2020-10-10 NOTE — ED Provider Notes (Signed)
[facility] Emergency Department Provider Note  ____________________________________________   Event Date/Time   First MD Initiated Contact with Patient 10/10/20 0935     (approximate)  I have reviewed the triage vital signs and the nursing notes.   HISTORY  Chief Complaint URI   HPI MANSFIELD DANN is a 60 y.o. male who presents to the emergency department for evaluation of cough, body aches and fever with known COVID-positive status.  Patient states a history of lung cancer that is in remission.  States that he was at a family gathering last week and multiple family members became COVID-positive this weekend.  He denies any chest pain, shortness of breath, abdominal pain.  He does endorse 1 day of diarrhea.         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   DDD (degenerative disc disease), cervical 11/02/2015   Fracture of lower extremity 09/19/2015   Elevated LFTs 09/19/2015   Chronic pain 09/19/2015   Cancer of upper lobe of right lung ([place]) 09/19/2015   Encounter for antineoplastic chemotherapy 09/19/2015   Current tobacco use 05/26/2014   OA (osteoarthritis) of knee 05/26/2014   CALCANEAL FRACTURE 01/21/2008   ANKLE SPRAIN, RIGHT 01/21/2008    Past Surgical History:  Procedure Laterality Date   amputation of index finger Right 2010   HAND SURGERY Right 2010   ORIF of right index finder, 2nd carpometacarpal joint dislocation right hand, proximal  right humerus   ORIF FEMUR FRACTURE Right 2010   PORT-A-CATH REMOVAL N/A 01/15/2017   Procedure: REMOVAL PORT-A-CATH;  Surgeon: Nestor Lewandowsky, MD;  Location: [facility] ORS;  Service: General;  Laterality: N/A;   SHOULDER SURGERY Right 2010   pins and rods in shoulder    Prior to Admission medications   Medication Sig Start Date End Date Taking? Authorizing Provider  albuterol (PROVENTIL) (2.5 MG/3ML) 0.083% nebulizer solution Take 3 mLs (2.5 mg total) by nebulization every 6 (six) hours as needed for wheezing or shortness of breath. Patient not taking: Reported on 05/23/2020 12/27/17   Cammie Sickle, MD  albuterol (VENTOLIN HFA) 108 (90 Base) MCG/ACT inhaler Inhale 2 puffs into the lungs every 6 (six) hours as needed for wheezing or shortness of breath. Patient not taking: Reported on 05/23/2020 10/12/19   Cammie Sickle, MD  Fluticasone-Salmeterol (ADVAIR DISKUS) 500-50 MCG/DOSE AEPB Inhale 1 puff into the lungs 2 (two) times daily. Patient not taking: Reported on 05/23/2020 10/12/19   Cammie Sickle, MD  gabapentin (NEURONTIN) 100 MG capsule Take 1 capsule by mouth 3 (three) times daily. Patient not taking: Reported on 05/23/2020 01/08/18   [provider]  Multiple Vitamin (MULTI-VITAMINS) TABS Take 1 tablet by mouth daily.    [provider]  oxyCODONE (OXYCONTIN) 10 mg 12 hr tablet Take 10 mg by mouth every 12 (twelve) hours. Patient not taking: Reported on 05/23/2020    [provider]  pregabalin (LYRICA) 25 MG capsule Take 1 capsule by mouth 2 (two) times daily. Patient not taking: Reported on 05/23/2020 04/21/18   [provider]    Allergies Patient has no known allergies.  Family History  Problem Relation Age of Onset   Hypertension Mother    Hypertension Father     Social History Social History   Tobacco Use   Smoking status: Every Day    Packs/day: 2.00    Years: 20.00    Pack years: 40.00    Types: Cigarettes    Smokeless tobacco: Never   Tobacco comments:    down to 3 cigarettes a month  Vaping Use   Vaping Use: Never used  Substance Use Topics   Alcohol use: Yes    Alcohol/week: 0.0 standard drinks    Comment: 2-4 Beers Weekly   Drug use: Yes    Types: Marijuana    Comment: last use 3 days ago    Review of Systems Constitutional: + fever/chills, + body aches Eyes: No visual changes. ENT: No sore throat. Cardiovascular: Denies chest pain. Respiratory: Denies shortness of breath. Gastrointestinal: No abdominal pain.  No nausea, no vomiting.  + diarrhea.  No constipation. Genitourinary: Negative for dysuria. Musculoskeletal: Negative for back pain. Skin: Negative for rash. Neurological: Negative for headaches, focal weakness or numbness.  ____________________________________________   PHYSICAL EXAM:  VITAL SIGNS: ED Triage Vitals  Enc Vitals Group     BP 10/10/20 0930 116/81     Pulse Rate 10/10/20 0930 74     Resp 10/10/20 0930 17     Temp 10/10/20 0930 98 F (36.7 C)     Temp Source 10/10/20 0930 Oral     SpO2 10/10/20 0930 97 %     Weight 10/10/20 0931 129 lb (58.5 kg)     Height 10/10/20 0931 5\' 9"  (1.753 m)     Head Circumference --      Peak Flow --      Pain Score 10/10/20 0931 6     Pain Loc --      Pain Edu? --      Excl. in [place]? --    Constitutional: Alert and oriented. Well appearing and in no acute distress. Eyes: Conjunctivae are normal. PERRL. EOMI. Head: Atraumatic. Nose: No congestion/rhinnorhea. Mouth/Throat: Mucous membranes are moist.  Oropharynx non-erythematous. Neck: No stridor.   Cardiovascular: Normal rate, regular rhythm. Grossly normal heart sounds.  Good peripheral circulation. Respiratory: Normal respiratory effort.  No retractions. Lungs with coarse breath sounds throughout, no rales rhonchi or wheezing noted. Gastrointestinal: Soft and nontender. No distention. No abdominal bruits. No CVA tenderness. Musculoskeletal: No lower extremity  tenderness nor edema.  No joint effusions. Neurologic:  Normal speech and language. No gross focal neurologic deficits are appreciated. No gait instability. Skin:  Skin is warm, dry and intact. No rash noted. Psychiatric: Mood and affect are normal. Speech and behavior are normal.  ____________________________________________   LABS (all labs ordered are listed, but only abnormal results are displayed)  Labs Reviewed  COMPREHENSIVE METABOLIC PANEL - Abnormal; Notable for the following components:      Result Value   Sodium 133 (*)    Calcium 8.4 (*)    AST 44 (*)    Alkaline Phosphatase 31 (*)    All other components within normal limits  CBC WITH DIFFERENTIAL/PLATELET    ____________________________________________   INITIAL IMPRESSION / ASSESSMENT AND PLAN / ED COURSE  As part of my medical decision making, I reviewed the  following data within the [place] notes reviewed and incorporated, Labs reviewed, discussed patient with pharmacist, and Notes from prior ED visits        Patient is a 60 year old male with past medical history significant for lung cancer in remission who presents to the emergency department with known COVID-positive status.  He endorses fever, body aches but denies any shortness of breath, chest pain or other significant symptoms.  In triage, patient has normal vital signs.  Physical exam is grossly reassuring, particularly given his age and prior medical history.  Laboratory evaluation was obtained with the intent of obtaining current GFR for prescription of Paxlovid.  However, the patient is on Advair and this was noted to be a high drug to drug interaction with Paxlovid.  Thus, the patient was discussed with the ER pharmacist to determine options.  Given that the patient was appropriate for discharge, discussed the option of one-time Bebtelovimab infusion.  I discussed the risk and benefits of this with the patient, and he is  agreeable to proceed with one-time infusion.  Return precautions were discussed at length, particularly regarding his history and patient is amenable with returning should any worsening symptoms arise.  He stable this time for outpatient follow-up.      ____________________________________________   FINAL CLINICAL IMPRESSION(S) / ED DIAGNOSES  Final diagnoses:  [place]     ED Discharge Orders     None        Note:  This document was prepared using Dragon voice recognition software and may include unintentional dictation errors.    Marlana Salvage, PA 10/11/20 [id number]    Duffy Bruce, MD 10/13/20 [phone]

## 2020-10-10 NOTE — ED Triage Notes (Signed)
Pt states he tested covid positive on Saturday, states other family in the house are covid+ as well. Pt c/o cough, bodyaches, fever

## 2020-10-10 NOTE — Discharge Instructions (Addendum)
Please treat your symptoms as above. If you develop any shortness of breath or worsening of symptoms, please return to the ER.

## 2020-10-10 NOTE — ED Notes (Signed)
See triage note  Presents with body aches and diarrhea  States sx's started last Thursday    States her has had 1 neg test  But took home test yesterday which was positive  Afebrile on arrival

## 2020-11-21 ENCOUNTER — Inpatient Hospital Stay: Payer: Medicaid Other | Attending: Internal Medicine

## 2020-11-21 ENCOUNTER — Inpatient Hospital Stay: Payer: Medicaid Other | Admitting: Internal Medicine

## 2021-04-07 ENCOUNTER — Encounter: Payer: Self-pay | Admitting: Internal Medicine

## 2021-04-07 ENCOUNTER — Inpatient Hospital Stay: Payer: Medicaid Other | Attending: Internal Medicine

## 2021-04-07 ENCOUNTER — Inpatient Hospital Stay (HOSPITAL_BASED_OUTPATIENT_CLINIC_OR_DEPARTMENT_OTHER): Payer: Medicaid Other | Admitting: Internal Medicine

## 2021-04-07 ENCOUNTER — Other Ambulatory Visit: Payer: Self-pay

## 2021-04-07 VITALS — BP 128/66 | HR 82 | Temp 97.2°F | Ht 69.0 in | Wt 108.8 lb

## 2021-04-07 DIAGNOSIS — Z79899 Other long term (current) drug therapy: Secondary | ICD-10-CM | POA: Insufficient documentation

## 2021-04-07 DIAGNOSIS — D649 Anemia, unspecified: Secondary | ICD-10-CM | POA: Insufficient documentation

## 2021-04-07 DIAGNOSIS — Z9221 Personal history of antineoplastic chemotherapy: Secondary | ICD-10-CM | POA: Insufficient documentation

## 2021-04-07 DIAGNOSIS — F17210 Nicotine dependence, cigarettes, uncomplicated: Secondary | ICD-10-CM | POA: Insufficient documentation

## 2021-04-07 DIAGNOSIS — G8929 Other chronic pain: Secondary | ICD-10-CM | POA: Insufficient documentation

## 2021-04-07 DIAGNOSIS — R630 Anorexia: Secondary | ICD-10-CM | POA: Insufficient documentation

## 2021-04-07 DIAGNOSIS — Z681 Body mass index (BMI) 19 or less, adult: Secondary | ICD-10-CM | POA: Insufficient documentation

## 2021-04-07 DIAGNOSIS — R059 Cough, unspecified: Secondary | ICD-10-CM | POA: Insufficient documentation

## 2021-04-07 DIAGNOSIS — C3411 Malignant neoplasm of upper lobe, right bronchus or lung: Secondary | ICD-10-CM

## 2021-04-07 DIAGNOSIS — R0602 Shortness of breath: Secondary | ICD-10-CM | POA: Insufficient documentation

## 2021-04-07 DIAGNOSIS — M549 Dorsalgia, unspecified: Secondary | ICD-10-CM | POA: Insufficient documentation

## 2021-04-07 DIAGNOSIS — M2550 Pain in unspecified joint: Secondary | ICD-10-CM | POA: Insufficient documentation

## 2021-04-07 DIAGNOSIS — F1290 Cannabis use, unspecified, uncomplicated: Secondary | ICD-10-CM | POA: Insufficient documentation

## 2021-04-07 DIAGNOSIS — R197 Diarrhea, unspecified: Secondary | ICD-10-CM | POA: Insufficient documentation

## 2021-04-07 DIAGNOSIS — Z8249 Family history of ischemic heart disease and other diseases of the circulatory system: Secondary | ICD-10-CM | POA: Insufficient documentation

## 2021-04-07 LAB — CBC WITH DIFFERENTIAL/PLATELET
Abs Immature Granulocytes: 0.02 10*3/uL (ref 0.00–0.07)
Basophils Absolute: 0 10*3/uL (ref 0.0–0.1)
Basophils Relative: 1 %
Eosinophils Absolute: 0.2 10*3/uL (ref 0.0–0.5)
Eosinophils Relative: 4 %
HCT: 39.5 % (ref 39.0–52.0)
Hemoglobin: 12.6 g/dL — ABNORMAL LOW (ref 13.0–17.0)
Immature Granulocytes: 0 %
Lymphocytes Relative: 32 %
Lymphs Abs: 1.7 10*3/uL (ref 0.7–4.0)
MCH: 28.8 pg (ref 26.0–34.0)
MCHC: 31.9 g/dL (ref 30.0–36.0)
MCV: 90.4 fL (ref 80.0–100.0)
Monocytes Absolute: 0.7 10*3/uL (ref 0.1–1.0)
Monocytes Relative: 13 %
Neutro Abs: 2.6 10*3/uL (ref 1.7–7.7)
Neutrophils Relative %: 50 %
Platelets: 200 10*3/uL (ref 150–400)
RBC: 4.37 MIL/uL (ref 4.22–5.81)
RDW: 13.5 % (ref 11.5–15.5)
WBC: 5.3 10*3/uL (ref 4.0–10.5)
nRBC: 0 % (ref 0.0–0.2)

## 2021-04-07 LAB — COMPREHENSIVE METABOLIC PANEL
ALT: 15 U/L (ref 0–44)
AST: 29 U/L (ref 15–41)
Albumin: 3.8 g/dL (ref 3.5–5.0)
Alkaline Phosphatase: 36 U/L — ABNORMAL LOW (ref 38–126)
Anion gap: 5 (ref 5–15)
BUN: 16 mg/dL (ref 6–20)
CO2: 28 mmol/L (ref 22–32)
Calcium: 8.7 mg/dL — ABNORMAL LOW (ref 8.9–10.3)
Chloride: 104 mmol/L (ref 98–111)
Creatinine, Ser: 1.24 mg/dL (ref 0.61–1.24)
GFR, Estimated: >60 mL/min (ref >60)
Glucose, Bld: 102 mg/dL — ABNORMAL HIGH (ref 70–99)
Potassium: 4 mmol/L (ref 3.5–5.1)
Sodium: 137 mmol/L (ref 135–145)
Total Bilirubin: 0.4 mg/dL (ref 0.3–1.2)
Total Protein: 7.2 g/dL (ref 6.5–8.1)

## 2021-04-07 MED ORDER — ALBUTEROL SULFATE HFA 108 (90 BASE) MCG/ACT IN AERS: 2.0000 | INHALATION_SPRAY | Freq: Four times a day (QID) | RESPIRATORY_TRACT | 2 refills | Status: AC | PRN

## 2021-04-07 NOTE — Progress Notes (Signed)
Pt states he has had SOB and a cough for 1-2 weeks. Not using the albuterol inhaler because he can't afford it but feels he would benefit from it if he could get it.

## 2021-04-07 NOTE — Progress Notes (Signed)
[place] OFFICE PROGRESS NOTE  Patient Care Team: Tracie Harrier, MD as PCP - General (Internal Medicine)   Cancer Staging  No matching staging information was found for the patient.   Oncology History Overview Note  # MARCH 2016- LUNG CA- RUL- ADENO CA [mol testing-NA sec to poor samples] STAGE III s/p chemo-RT; [SEP 2016-CT Bx for mol testing; neg for malignancy]; CT-29th  NOV 2016-  Stable/ improved right upper lobe lesion;   # RECURRENT- CT JAN 2017- Stable right upper lobe lung nodule ~ 2.5cm; but progressive RML ~1.8cm; Start Tecentriq q 3W x3 ; March 30th CT 2017- Improved Lung Lesions; RML- 1.6x1.0; RUL ~3cm.; AUG 2nd 2017-CT stable.   # April/May 2018- RUL- local Progression on CT/PET- referral to rad-Onc  # July 2017 Elevated LFTs- likely from Hepatitis C [resolved on treatment- July 2018]; NOT from Tecentriq.  # Hep C [june 2018; treatment at [facility]]  # Chronic pain- on oxycodone.jan 2017-Bone scan- neg.  ---------------------------------------------------------------------------------    DIAGNOSIS: [ ]  RECURRENT ADENO CA LUNG  STAGE:  III   ; GOALS: CONTROL  CURRENT/MOST RECENT THERAPY [ ]  surveillance; last tecentrriq       Cancer of upper lobe of right lung (HCC)      INTERVAL HISTORY: Alone.  Ambulating independently.  Mario [name] 61 y.o.  male pleasant patient above history of recurrent stage III adenocarcinoma the lung is here for follow-up.  Patient complains of worsening shortness of breath cough wheezing in the last few months progressive getting worse.  Patient has not been using his inhalers.  Poor appetite.  Lost about 7 pounds in the last few months.  Review of Systems  Constitutional:  Negative for chills, diaphoresis, fever, malaise/fatigue and weight loss.  HENT:  Negative for nosebleeds and sore throat.   Eyes:  Negative for double vision.  Respiratory:  Positive for cough, sputum production and shortness of  breath. Negative for hemoptysis and wheezing.   Cardiovascular:  Negative for chest pain, palpitations, orthopnea and leg swelling.  Gastrointestinal:  Positive for diarrhea. Negative for abdominal pain, blood in stool, constipation, heartburn, melena, nausea and vomiting.  Genitourinary:  Negative for dysuria, frequency and urgency.  Musculoskeletal:  Positive for back pain and joint pain.  Skin: Negative.  Negative for itching and rash.  Neurological:  Negative for dizziness, tingling, focal weakness, weakness and headaches.  Endo/Heme/Allergies:  Does not bruise/bleed easily.  Psychiatric/Behavioral:  Negative for depression. The patient is not nervous/anxious and does not have insomnia.      PAST MEDICAL HISTORY :  Past Medical History:  Diagnosis Date   Arthritis    Cancer of upper lobe of right lung ([place]) 09/19/2015   Chicken pox    Chronic pain 09/19/2015   Current tobacco use 05/26/2014   Elevated LFTs 09/19/2015   Fracture of lower extremity 09/19/2015   Kidney stones    Lung cancer ([place]) 2015   OA (osteoarthritis) of knee 05/26/2014   Status post chemotherapy     PAST SURGICAL HISTORY :   Past Surgical History:  Procedure Laterality Date   amputation of index finger Right 2010   HAND SURGERY Right 2010   ORIF of right index finder, 2nd carpometacarpal joint dislocation right hand, proximal right humerus   ORIF FEMUR FRACTURE Right 2010   PORT-A-CATH REMOVAL N/A 01/15/2017   Procedure: REMOVAL PORT-A-CATH;  Surgeon: Nestor Lewandowsky, MD;  Location: [facility] ORS;  Service: General;  Laterality: N/A;   SHOULDER SURGERY Right 2010  pins and rods in shoulder    FAMILY HISTORY :   Family History  Problem Relation Age of Onset   Hypertension Mother    Hypertension Father     SOCIAL HISTORY:   Social History   Tobacco Use   Smoking status: Every Day    Packs/day: 2.00    Years: 20.00    Pack years: 40.00    Types: Cigarettes   Smokeless tobacco: Never   Tobacco  comments:    down to 3 cigarettes a month  Vaping Use   Vaping Use: Never used  Substance Use Topics   Alcohol use: Not Currently    Comment: 2-4 Beers Weekly   Drug use: Yes    Types: Marijuana    Comment: last use 3 days ago    ALLERGIES:  has No Known Allergies.  MEDICATIONS:  Current Outpatient Medications  Medication Sig Dispense Refill   Multiple Vitamin (MULTI-VITAMINS) TABS Take 1 tablet by mouth daily.     albuterol (PROVENTIL) (2.5 MG/3ML) 0.083% nebulizer solution Take 3 mLs (2.5 mg total) by nebulization every 6 (six) hours as needed for wheezing or shortness of breath. (Patient not taking: Reported on 05/23/2020) 75 mL 3   albuterol (VENTOLIN HFA) 108 (90 Base) MCG/ACT inhaler Inhale 2 puffs into the lungs every 6 (six) hours as needed for wheezing or shortness of breath. 8 g 2   Fluticasone-Salmeterol (ADVAIR DISKUS) 500-50 MCG/DOSE AEPB Inhale 1 puff into the lungs 2 (two) times daily. (Patient not taking: Reported on 04/07/2021) 1 each 3   gabapentin (NEURONTIN) 100 MG capsule Take 1 capsule by mouth 3 (three) times daily. (Patient not taking: Reported on 05/23/2020)  0   oxyCODONE (OXYCONTIN) 10 mg 12 hr tablet Take 10 mg by mouth every 12 (twelve) hours. (Patient not taking: Reported on 05/23/2020)     pregabalin (LYRICA) 25 MG capsule Take 1 capsule by mouth 2 (two) times daily. (Patient not taking: Reported on 05/23/2020)     No current facility-administered medications for this visit.   Facility-Administered Medications Ordered in Other Visits  Medication Dose Route Frequency Provider Last Rate Last Admin   sodium chloride flush (NS) 0.9 % injection 10 mL  10 mL Intracatheter PRN Cammie Sickle, MD   10 mL at 05/26/15 1300   sodium chloride flush (NS) 0.9 % injection 10 mL  10 mL Intravenous PRN Cammie Sickle, MD   10 mL at 07/07/15 1425    PHYSICAL EXAMINATION: ECOG PERFORMANCE STATUS: 0 - Asymptomatic  BP 128/66 (BP Location: Left Arm, Patient  Position: Sitting, Cuff Size: Normal)    Pulse 82    Temp (!) 97.2 F (36.2 C) (Tympanic)    Ht 5\' 9"  (1.753 m)    Wt 108 lb 12.8 oz (49.4 kg)    SpO2 98%    BMI 16.07 kg/m   Filed Weights   04/07/21 1027  Weight: 108 lb 12.8 oz (49.4 kg)    Physical Exam HENT:     Head: Normocephalic and atraumatic.     Mouth/Throat:     Pharynx: No oropharyngeal exudate.  Eyes:     Pupils: Pupils are equal, round, and reactive to light.  Cardiovascular:     Rate and Rhythm: Normal rate and regular rhythm.  Pulmonary:     Effort: No respiratory distress.     Breath sounds: No wheezing.     Comments: Decreased air entry bilaterally. Abdominal:     General: Bowel sounds are normal. There is  no distension.     Palpations: Abdomen is soft. There is no mass.     Tenderness: There is no abdominal tenderness. There is no guarding or rebound.  Musculoskeletal:        General: No tenderness. Normal range of motion.     Cervical back: Normal range of motion and neck supple.  Skin:    General: Skin is warm.  Neurological:     Mental Status: He is alert and oriented to person, place, and time.  Psychiatric:        Mood and Affect: Affect normal.       LABORATORY DATA:  I have reviewed the data as listed    Component Value Date/Time   NA 137 04/07/2021 1015   NA 136 07/27/2014 0856   K 4.0 04/07/2021 1015   K 3.9 07/27/2014 0856   CL 104 04/07/2021 1015   CL 104 07/27/2014 0856   CO2 28 04/07/2021 1015   CO2 27 07/27/2014 0856   GLUCOSE 102 (H) 04/07/2021 1015   GLUCOSE 151 (H) 07/27/2014 0856   BUN 16 04/07/2021 1015   BUN 10 07/27/2014 0856   CREATININE 1.24 04/07/2021 1015   CREATININE 0.86 07/27/2014 0856   CREATININE 0.77 07/06/2014 0000   CALCIUM 8.7 (L) 04/07/2021 1015   CALCIUM 8.6 (L) 07/27/2014 0856   PROT 7.2 04/07/2021 1015   PROT 8.8 (H) 06/28/2014 1512   ALBUMIN 3.8 04/07/2021 1015   ALBUMIN 4.0 06/28/2014 1512   AST 29 04/07/2021 1015   AST 37 06/28/2014 1512    ALT 15 04/07/2021 1015   ALT 43 06/28/2014 1512   ALKPHOS 36 (L) 04/07/2021 1015   ALKPHOS 53 06/28/2014 1512   BILITOT 0.4 04/07/2021 1015   BILITOT 0.3 06/28/2014 1512   GFRNONAA >60 04/07/2021 1015   GFRNONAA >60 07/27/2014 0856   GFRAA >60 10/12/2019 1504   GFRAA >60 07/27/2014 0856    No results found for: SPEP, UPEP  Lab Results  Component Value Date   WBC 5.3 04/07/2021   NEUTROABS 2.6 04/07/2021   HGB 12.6 (L) 04/07/2021   HCT 39.5 04/07/2021   MCV 90.4 04/07/2021   PLT 200 04/07/2021      Chemistry      Component Value Date/Time   NA 137 04/07/2021 1015   NA 136 07/27/2014 0856   K 4.0 04/07/2021 1015   K 3.9 07/27/2014 0856   CL 104 04/07/2021 1015   CL 104 07/27/2014 0856   CO2 28 04/07/2021 1015   CO2 27 07/27/2014 0856   BUN 16 04/07/2021 1015   BUN 10 07/27/2014 0856   CREATININE 1.24 04/07/2021 1015   CREATININE 0.86 07/27/2014 0856   CREATININE 0.77 07/06/2014 0000      Component Value Date/Time   CALCIUM 8.7 (L) 04/07/2021 1015   CALCIUM 8.6 (L) 07/27/2014 0856   ALKPHOS 36 (L) 04/07/2021 1015   ALKPHOS 53 06/28/2014 1512   AST 29 04/07/2021 1015   AST 37 06/28/2014 1512   ALT 15 04/07/2021 1015   ALT 43 06/28/2014 1512   BILITOT 0.4 04/07/2021 1015   BILITOT 0.3 06/28/2014 1512       RADIOGRAPHIC STUDIES: I have personally reviewed the radiological images as listed and agreed with the findings in the report. No results found.   ASSESSMENT & PLAN:  Cancer of upper lobe of right lung [facility]) #Recurrent adenocarcinoma of the lung-right upper lobe/status post right upper lobe nodule SBRT June 2018. FEB 18th, 2022- Stable extensive post  treatment changes involving the right upper lobe. No findings suspicious for recurrent mass, mediastinal/hilar adenopathy or metastatic pulmonary disease.   #Given the worsening symptoms-worsening shortness of breath cough weight loss-?  Concern for recurrence/progression.  Recommend  CT CAP STAT.  Also  recommend albuterol inhaler; refill.  # Mild Anemia- Hb 12.6 ; remote colonoscopy; check Iron studies/ferritin.  If low would recommend GI evaluation/colonoscopy.  # Chronic pain- unrelated to cancer follow up with pain clinic-_STABLE.   # Active smoker- counseled to quit. 1-2 cig/week.STABLE.    # DISPOSITION:  # CT CAP STAT # Follow up in 6 months- MD/labs-cbc/cmp;-Dr.B      Orders Placed This Encounter  Procedures   CT CHEST ABDOMEN PELVIS W CONTRAST    Standing Status:   Future    Standing Expiration Date:   04/07/2022    Order Specific Question:   Preferred imaging location?    Answer:   [facility]    Order Specific Question:   Radiology Contrast Protocol - do NOT remove file path    Answer:   [email or web address]   CBC with Differential/Platelet    Standing Status:   Future    Standing Expiration Date:   04/07/2022   Comprehensive metabolic panel    Standing Status:   Future    Standing Expiration Date:   04/07/2022   All questions were answered. The patient knows to call the clinic with any problems, questions or concerns.      Cammie Sickle, MD 04/07/2021 12:37 PM

## 2021-04-07 NOTE — Assessment & Plan Note (Addendum)
#  Recurrent adenocarcinoma of the lung-right upper lobe/status post right upper lobe nodule SBRT June 2018. FEB 18th, 2022- Stable extensive post treatment changes involving the right upper lobe. No findings suspicious for recurrent mass, mediastinal/hilar adenopathy or metastatic pulmonary disease.   #Given the worsening symptoms-worsening shortness of breath cough weight loss-?  Concern for recurrence/progression.  Recommend  CT CAP STAT.  Also recommend albuterol inhaler; refill.  # Mild Anemia- Hb 12.6 ; remote colonoscopy; check Iron studies/ferritin.  If low would recommend GI evaluation/colonoscopy.  # Chronic pain- unrelated to cancer follow up with pain clinic-_STABLE.   # Active smoker- counseled to quit. 1-2 cig/week.STABLE.    # DISPOSITION:  # CT CAP STAT # Follow up in 6 months- MD/labs-cbc/cmp;-Dr.B

## 2021-04-10 ENCOUNTER — Ambulatory Visit: Payer: Self-pay | Attending: Internal Medicine

## 2021-08-18 ENCOUNTER — Ambulatory Visit
Admission: RE | Admit: 2021-08-18 | Discharge: 2021-08-18 | Disposition: A | Discharge status: Home / Self Care | Payer: No Typology Code available for payment source | Source: Ambulatory Visit | Attending: Internal Medicine | Admitting: Internal Medicine

## 2021-08-18 DIAGNOSIS — C3411 Malignant neoplasm of upper lobe, right bronchus or lung: Secondary | ICD-10-CM

## 2021-08-18 LAB — POCT I-STAT CREATININE: Creatinine, Ser: 1.2 mg/dL (ref 0.61–1.24)

## 2021-08-18 MED ORDER — IOHEXOL 300 MG/ML  SOLN
85.0000 mL | Freq: Once | INTRAMUSCULAR | Status: AC | PRN
  Administered 2021-08-18: 85 mL via INTRAVENOUS

## 2021-09-07 ENCOUNTER — Other Ambulatory Visit: Payer: Self-pay

## 2021-09-07 ENCOUNTER — Telehealth: Payer: Self-pay | Admitting: Internal Medicine

## 2021-09-07 DIAGNOSIS — C3411 Malignant neoplasm of upper lobe, right bronchus or lung: Secondary | ICD-10-CM

## 2021-09-07 NOTE — Telephone Encounter (Signed)
Called to inform patient of appointment scheduled.

## 2021-09-12 ENCOUNTER — Inpatient Hospital Stay: Payer: No Typology Code available for payment source | Attending: Internal Medicine

## 2021-09-12 ENCOUNTER — Inpatient Hospital Stay: Payer: No Typology Code available for payment source | Admitting: Internal Medicine

## 2021-10-02 ENCOUNTER — Emergency Department
Admission: EM | Admit: 2021-10-02 | Discharge: 2021-10-02 | Discharge status: Against Medical Advice | Payer: No Typology Code available for payment source | Source: Home / Self Care

## 2021-10-06 ENCOUNTER — Encounter: Payer: Self-pay | Admitting: Internal Medicine

## 2021-10-06 ENCOUNTER — Inpatient Hospital Stay: Payer: No Typology Code available for payment source | Attending: Internal Medicine

## 2021-10-06 ENCOUNTER — Inpatient Hospital Stay: Payer: No Typology Code available for payment source | Admitting: Internal Medicine

## 2021-10-06 DIAGNOSIS — C3411 Malignant neoplasm of upper lobe, right bronchus or lung: Secondary | ICD-10-CM | POA: Insufficient documentation

## 2021-10-06 DIAGNOSIS — F17210 Nicotine dependence, cigarettes, uncomplicated: Secondary | ICD-10-CM | POA: Insufficient documentation

## 2021-10-06 DIAGNOSIS — R0602 Shortness of breath: Secondary | ICD-10-CM | POA: Insufficient documentation

## 2021-10-11 ENCOUNTER — Ambulatory Visit: Payer: No Typology Code available for payment source | Admitting: Medical Oncology

## 2021-10-11 ENCOUNTER — Inpatient Hospital Stay: Payer: No Typology Code available for payment source

## 2021-10-11 DIAGNOSIS — R0602 Shortness of breath: Secondary | ICD-10-CM | POA: Diagnosis not present

## 2021-10-11 DIAGNOSIS — F17210 Nicotine dependence, cigarettes, uncomplicated: Secondary | ICD-10-CM | POA: Diagnosis not present

## 2021-10-11 DIAGNOSIS — C3411 Malignant neoplasm of upper lobe, right bronchus or lung: Secondary | ICD-10-CM

## 2021-10-11 LAB — COMPREHENSIVE METABOLIC PANEL
ALT: 13 U/L (ref 0–44)
AST: 25 U/L (ref 15–41)
Albumin: 3.7 g/dL (ref 3.5–5.0)
Alkaline Phosphatase: 35 U/L — ABNORMAL LOW (ref 38–126)
Anion gap: 6 (ref 5–15)
BUN: 16 mg/dL (ref 8–23)
CO2: 26 mmol/L (ref 22–32)
Calcium: 8.7 mg/dL — ABNORMAL LOW (ref 8.9–10.3)
Chloride: 103 mmol/L (ref 98–111)
Creatinine, Ser: 1.28 mg/dL — ABNORMAL HIGH (ref 0.61–1.24)
GFR, Estimated: >60 mL/min (ref >60)
Glucose, Bld: 86 mg/dL (ref 70–99)
Potassium: 4.2 mmol/L (ref 3.5–5.1)
Sodium: 135 mmol/L (ref 135–145)
Total Bilirubin: 0.5 mg/dL (ref 0.3–1.2)
Total Protein: 7.3 g/dL (ref 6.5–8.1)

## 2021-10-11 LAB — CBC WITH DIFFERENTIAL/PLATELET
Abs Immature Granulocytes: 0.01 10*3/uL (ref 0.00–0.07)
Basophils Absolute: 0 10*3/uL (ref 0.0–0.1)
Basophils Relative: 0 %
Eosinophils Absolute: 0.1 10*3/uL (ref 0.0–0.5)
Eosinophils Relative: 2 %
HCT: 35.9 % — ABNORMAL LOW (ref 39.0–52.0)
Hemoglobin: 11.4 g/dL — ABNORMAL LOW (ref 13.0–17.0)
Immature Granulocytes: 0 %
Lymphocytes Relative: 41 %
Lymphs Abs: 3 10*3/uL (ref 0.7–4.0)
MCH: 28.3 pg (ref 26.0–34.0)
MCHC: 31.8 g/dL (ref 30.0–36.0)
MCV: 89.1 fL (ref 80.0–100.0)
Monocytes Absolute: 0.8 10*3/uL (ref 0.1–1.0)
Monocytes Relative: 11 %
Neutro Abs: 3.4 10*3/uL (ref 1.7–7.7)
Neutrophils Relative %: 46 %
Platelets: 222 10*3/uL (ref 150–400)
RBC: 4.03 MIL/uL — ABNORMAL LOW (ref 4.22–5.81)
RDW: 12.7 % (ref 11.5–15.5)
WBC: 7.3 10*3/uL (ref 4.0–10.5)
nRBC: 0 % (ref 0.0–0.2)

## 2021-10-18 ENCOUNTER — Inpatient Hospital Stay: Payer: No Typology Code available for payment source | Admitting: Medical Oncology

## 2021-10-31 ENCOUNTER — Other Ambulatory Visit: Payer: Self-pay

## 2021-10-31 DIAGNOSIS — C3411 Malignant neoplasm of upper lobe, right bronchus or lung: Secondary | ICD-10-CM

## 2021-11-02 ENCOUNTER — Encounter: Payer: Self-pay | Admitting: Nurse Practitioner

## 2021-11-02 ENCOUNTER — Inpatient Hospital Stay: Payer: No Typology Code available for payment source | Attending: Internal Medicine | Admitting: Nurse Practitioner

## 2021-11-02 VITALS — BP 125/63 | HR 53 | Temp 97.6°F | Resp 18 | Wt 107.6 lb

## 2021-11-02 DIAGNOSIS — C3411 Malignant neoplasm of upper lobe, right bronchus or lung: Secondary | ICD-10-CM | POA: Insufficient documentation

## 2021-11-02 DIAGNOSIS — G8929 Other chronic pain: Secondary | ICD-10-CM | POA: Diagnosis not present

## 2021-11-02 DIAGNOSIS — N1330 Unspecified hydronephrosis: Secondary | ICD-10-CM | POA: Diagnosis not present

## 2021-11-02 DIAGNOSIS — D649 Anemia, unspecified: Secondary | ICD-10-CM | POA: Insufficient documentation

## 2021-11-02 DIAGNOSIS — F17210 Nicotine dependence, cigarettes, uncomplicated: Secondary | ICD-10-CM | POA: Diagnosis not present

## 2021-11-02 NOTE — Progress Notes (Signed)
Patient reports difficulty swallowing solids as well sore throat for the past 2 weeks.

## 2021-11-02 NOTE — Progress Notes (Signed)
[place] OFFICE PROGRESS NOTE  Patient Care Team: Tracie Harrier, MD as PCP - General (Internal Medicine) Cammie Sickle, MD as Consulting Physician (Oncology)   Cancer Staging  No matching staging information was found for the patient.  Oncology History Overview Note  # MARCH 2016- LUNG CA- RUL- ADENO CA [mol testing-NA sec to poor samples] STAGE III s/p chemo-RT; [SEP 2016-CT Bx for mol testing; neg for malignancy]; CT-29th  NOV 2016-  Stable/ improved right upper lobe lesion;   # RECURRENT- CT JAN 2017- Stable right upper lobe lung nodule ~ 2.5cm; but progressive RML ~1.8cm; Start Tecentriq q 3W x3 ; March 30th CT 2017- Improved Lung Lesions; RML- 1.6x1.0; RUL ~3cm.; AUG 2nd 2017-CT stable.   # April/May 2018- RUL- local Progression on CT/PET- referral to rad-Onc  # July 2017 Elevated LFTs- likely from Hepatitis C [resolved on treatment- July 2018]; NOT from Tecentriq.  # Hep C [june 2018; treatment at [facility]]  # Chronic pain- on oxycodone.jan 2017-Bone scan- neg.  ---------------------------------------------------------------------------------    DIAGNOSIS: [ ]  RECURRENT ADENO CA LUNG  STAGE:  III   ; GOALS: CONTROL  CURRENT/MOST RECENT THERAPY [ ]  surveillance; last tecentrriq       Cancer of upper lobe of right lung (HCC)      INTERVAL HISTORY: Alone.  Ambulating independently.  Mario [name] 61 y.o. male pleasant patient above history of recurrent stage III adenocarcinoma the lung is here for follow-up. She reports difficulty swallowing and sore throat x 2 weeks. Has chronic shortness of breath. She went to ER on 10/02/21 but left without being seen.    Review of Systems  Constitutional:  Negative for chills, diaphoresis, fever, malaise/fatigue and weight loss.  HENT:  Negative for nosebleeds and sore throat.   Eyes:  Negative for double vision.  Respiratory:  Positive for cough, sputum production and shortness of breath. Negative  for hemoptysis and wheezing.   Cardiovascular:  Negative for chest pain, palpitations, orthopnea and leg swelling.  Gastrointestinal:  Positive for diarrhea. Negative for abdominal pain, blood in stool, constipation, heartburn, melena, nausea and vomiting.  Genitourinary:  Negative for dysuria, frequency and urgency.  Musculoskeletal:  Positive for back pain and joint pain.  Skin: Negative.  Negative for itching and rash.  Neurological:  Negative for dizziness, tingling, focal weakness, weakness and headaches.  Endo/Heme/Allergies:  Does not bruise/bleed easily.  Psychiatric/Behavioral:  Negative for depression. The patient is not nervous/anxious and does not have insomnia.       PAST MEDICAL HISTORY :  Past Medical History:  Diagnosis Date   Arthritis    Cancer of upper lobe of right lung ([place]) 09/19/2015   Chicken pox    Chronic pain 09/19/2015   Current tobacco use 05/26/2014   Elevated LFTs 09/19/2015   Fracture of lower extremity 09/19/2015   Kidney stones    Lung cancer ([place]) 2015   OA (osteoarthritis) of knee 05/26/2014   Status post chemotherapy     PAST SURGICAL HISTORY :   Past Surgical History:  Procedure Laterality Date   amputation of index finger Right 2010   HAND SURGERY Right 2010   ORIF of right index finder, 2nd carpometacarpal joint dislocation right hand, proximal right humerus   ORIF FEMUR FRACTURE Right 2010   PORT-A-CATH REMOVAL N/A 01/15/2017   Procedure: REMOVAL PORT-A-CATH;  Surgeon: Nestor Lewandowsky, MD;  Location: [facility] ORS;  Service: General;  Laterality: N/A;   SHOULDER SURGERY Right 2010   pins and rods  in shoulder    FAMILY HISTORY :   Family History  Problem Relation Age of Onset   Hypertension Mother    Hypertension Father     SOCIAL HISTORY:   Social History   Tobacco Use   Smoking status: Every Day    Packs/day: 2.00    Years: 20.00    Total pack years: 40.00    Types: Cigarettes   Smokeless tobacco: Never   Tobacco comments:     down to 3 cigarettes a month  Vaping Use   Vaping Use: Never used  Substance Use Topics   Alcohol use: Not Currently    Comment: 2-4 Beers Weekly   Drug use: Yes    Types: Marijuana    Comment: last use 3 days ago    ALLERGIES:  has No Known Allergies.  MEDICATIONS:  Current Outpatient Medications  Medication Sig Dispense Refill   albuterol (VENTOLIN HFA) 108 (90 Base) MCG/ACT inhaler Inhale 2 puffs into the lungs every 6 (six) hours as needed for wheezing or shortness of breath. 8 g 2   ibuprofen (ADVIL) 800 MG tablet Take 800 mg by mouth 3 (three) times daily.     Multiple Vitamin (MULTI-VITAMINS) TABS Take 1 tablet by mouth daily.     albuterol (PROVENTIL) (2.5 MG/3ML) 0.083% nebulizer solution Take 3 mLs (2.5 mg total) by nebulization every 6 (six) hours as needed for wheezing or shortness of breath. (Patient not taking: Reported on 05/23/2020) 75 mL 3   Fluticasone-Salmeterol (ADVAIR DISKUS) 500-50 MCG/DOSE AEPB Inhale 1 puff into the lungs 2 (two) times daily. (Patient not taking: Reported on 04/07/2021) 1 each 3   gabapentin (NEURONTIN) 100 MG capsule Take 1 capsule by mouth 3 (three) times daily. (Patient not taking: Reported on 05/23/2020)  0   oxyCODONE (OXYCONTIN) 10 mg 12 hr tablet Take 10 mg by mouth every 12 (twelve) hours. (Patient not taking: Reported on 05/23/2020)     pregabalin (LYRICA) 25 MG capsule Take 1 capsule by mouth 2 (two) times daily. (Patient not taking: Reported on 05/23/2020)     No current facility-administered medications for this visit.   Facility-Administered Medications Ordered in Other Visits  Medication Dose Route Frequency Provider Last Rate Last Admin   sodium chloride flush (NS) 0.9 % injection 10 mL  10 mL Intracatheter PRN Cammie Sickle, MD   10 mL at 05/26/15 1300   sodium chloride flush (NS) 0.9 % injection 10 mL  10 mL Intravenous PRN Cammie Sickle, MD   10 mL at 07/07/15 1425    PHYSICAL EXAMINATION: ECOG PERFORMANCE STATUS:  0 - Asymptomatic  BP 125/63 (BP Location: Left Arm, Patient Position: Sitting)   Pulse (!) 53   Temp 97.6 F (36.4 C) (Tympanic)   Resp 18   Wt 107 lb 9.6 oz (48.8 kg)   SpO2 100%   BMI 15.89 kg/m   Filed Weights   11/02/21 1400  Weight: 107 lb 9.6 oz (48.8 kg)    Physical Exam HENT:     Head: Normocephalic and atraumatic.     Mouth/Throat:     Pharynx: No oropharyngeal exudate.  Eyes:     Pupils: Pupils are equal, round, and reactive to light.  Cardiovascular:     Rate and Rhythm: Normal rate and regular rhythm.  Pulmonary:     Effort: No respiratory distress.     Breath sounds: No wheezing.     Comments: Decreased air entry bilaterally. Abdominal:     General: Bowel sounds  are normal. There is no distension.     Palpations: Abdomen is soft. There is no mass.     Tenderness: There is no abdominal tenderness. There is no guarding or rebound.  Musculoskeletal:        General: No tenderness. Normal range of motion.     Cervical back: Normal range of motion and neck supple.  Skin:    General: Skin is warm.  Neurological:     Mental Status: He is alert and oriented to person, place, and time.  Psychiatric:        Mood and Affect: Affect normal.    LABORATORY DATA:  I have reviewed the data as listed    Component Value Date/Time   NA 135 10/11/2021 0843   NA 136 07/27/2014 0856   K 4.2 10/11/2021 0843   K 3.9 07/27/2014 0856   CL 103 10/11/2021 0843   CL 104 07/27/2014 0856   CO2 26 10/11/2021 0843   CO2 27 07/27/2014 0856   GLUCOSE 86 10/11/2021 0843   GLUCOSE 151 (H) 07/27/2014 0856   BUN 16 10/11/2021 0843   BUN 10 07/27/2014 0856   CREATININE 1.28 (H) 10/11/2021 0843   CREATININE 0.86 07/27/2014 0856   CREATININE 0.77 07/06/2014 0000   CALCIUM 8.7 (L) 10/11/2021 0843   CALCIUM 8.6 (L) 07/27/2014 0856   PROT 7.3 10/11/2021 0843   PROT 8.8 (H) 06/28/2014 1512   ALBUMIN 3.7 10/11/2021 0843   ALBUMIN 4.0 06/28/2014 1512   AST 25 10/11/2021 0843   AST  37 06/28/2014 1512   ALT 13 10/11/2021 0843   ALT 43 06/28/2014 1512   ALKPHOS 35 (L) 10/11/2021 0843   ALKPHOS 53 06/28/2014 1512   BILITOT 0.5 10/11/2021 0843   BILITOT 0.3 06/28/2014 1512   GFRNONAA >60 10/11/2021 0843   GFRNONAA >60 07/27/2014 0856   GFRAA >60 10/12/2019 1504   GFRAA >60 07/27/2014 0856   Lab Results  Component Value Date   WBC 7.3 10/11/2021   NEUTROABS 3.4 10/11/2021   HGB 11.4 (L) 10/11/2021   HCT 35.9 (L) 10/11/2021   MCV 89.1 10/11/2021   PLT 222 10/11/2021    RADIOGRAPHIC STUDIES: I have personally reviewed the radiological images as listed and agreed with the findings in the report. No results found.   ASSESSMENT & PLAN:  No problem-specific Assessment & Plan notes found for this encounter.  Cancer of upper lobe of right lung [facility]) #Recurrent adenocarcinoma of the lung-right upper lobe/status post right upper lobe nodule SBRT June 2018. FEB 18th, 2022- Stable extensive post treatment changes involving the right upper lobe. No findings suspicious for recurrent mass, mediastinal/hilar adenopathy or metastatic pulmonary disease. Surveillance scan 08/18/21- CT C/A/P W Contrast for (worsening sob, cough, weight loss), unchanged appearance of RUL and no evidence of progressive disease. Incidental as below.   # Right Hydronephrosis- moderate right hydronephrosis without hydroureter or calculus identified. 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 Urology referral sent.   Incidentals: aortic atherosclerosis, emphysema, foraminal impingement at L5-S1   # Mild Anemia- 11.4 today. Remote colonoscopy. Ferritin and Iron studies checked 05/2020 were normal.    # Chronic pain- unrelated to cancer follow up with pain clinic-_STABLE.    # Active smoker- counseled to quit. 1-2 cig/week.STABLE.     # DISPOSITION:  Ref urology 6 mo- ct chest Follow up with Dr. Rogue Bussing for results few days later- [place]  No orders of the defined types were  placed in this encounter.  All  questions were answered. The patient knows to call the clinic with any problems, questions or concerns.    Verlon Au, NP 11/02/2021

## 2021-11-08 ENCOUNTER — Encounter: Payer: Self-pay | Admitting: *Deleted

## 2022-05-04 ENCOUNTER — Telehealth: Payer: Self-pay | Admitting: Internal Medicine

## 2022-05-04 NOTE — Telephone Encounter (Signed)
Called patient to advise CT has not been authorized- left voicemail

## 2022-05-07 ENCOUNTER — Ambulatory Visit: Payer: Medicaid Other

## 2022-05-10 ENCOUNTER — Ambulatory Visit: Payer: No Typology Code available for payment source | Admitting: Internal Medicine

## 2022-05-11 ENCOUNTER — Telehealth: Payer: Self-pay | Admitting: *Deleted

## 2022-05-11 ENCOUNTER — Telehealth: Payer: Self-pay | Admitting: Internal Medicine

## 2022-06-01 NOTE — Telephone Encounter (Signed)
Call placed to Officer Jarnello to let her know Dr Rogue Bussing has agreed to sign a death certificate. The officer will release the patient to the funeral home.

## 2022-06-01 NOTE — Telephone Encounter (Signed)
Officer Elmer Ramp called reporting that patient passed away at 733 this morning and needs to know if Dr B will sign the Death Certificate. She reports that EMS was present at time of death, no foul play is suspected, patient awoke this morning spitting up blood and having difficulty breathing, Family was assisting him to bathroom and he collapsed on the floor. CPR was initiated and EMS was called and arrived prior to patient death

## 2022-06-01 NOTE — Telephone Encounter (Signed)
Office [place] [facility] PD) called and got answering service to inform Dr Rogue Bussing that this patient has passed away. He did not say when but wanted someone to sign the death certificate.

## 2022-06-01 DEATH — deceased
